# Patient Record
Sex: Male | Born: 1950 | Race: Black or African American | Hispanic: No | Smoking: Former smoker
Health system: Southern US, Community
[De-identification: ages and names within clinical notes are randomized; demographics above are authoritative.]

## PROBLEM LIST (undated history)

## (undated) DIAGNOSIS — K219 Gastro-esophageal reflux disease without esophagitis: Secondary | ICD-10-CM

## (undated) DIAGNOSIS — C61 Malignant neoplasm of prostate: Principal | ICD-10-CM

## (undated) DIAGNOSIS — C719 Malignant neoplasm of brain, unspecified: Secondary | ICD-10-CM

## (undated) DIAGNOSIS — Z809 Family history of malignant neoplasm, unspecified: Secondary | ICD-10-CM

## (undated) DIAGNOSIS — Z87891 Personal history of nicotine dependence: Secondary | ICD-10-CM

## (undated) DIAGNOSIS — C419 Malignant neoplasm of bone and articular cartilage, unspecified: Secondary | ICD-10-CM

## (undated) DIAGNOSIS — E669 Obesity, unspecified: Secondary | ICD-10-CM

## (undated) DIAGNOSIS — I10 Essential (primary) hypertension: Secondary | ICD-10-CM

## (undated) HISTORY — DX: Family history of malignant neoplasm, unspecified: Z80.9

## (undated) HISTORY — PX: COLONOSCOPY: SHX174

## (undated) HISTORY — DX: Obesity, unspecified: E66.9

## (undated) HISTORY — DX: Personal history of nicotine dependence: Z87.891

## (undated) HISTORY — DX: Malignant neoplasm of prostate: C61

---

## 2005-11-05 ENCOUNTER — Emergency Department (HOSPITAL_COMMUNITY): Admission: EM | Admit: 2005-11-05 | Discharge: 2005-11-05 | Payer: Self-pay | Admitting: Emergency Medicine

## 2006-05-15 ENCOUNTER — Emergency Department (HOSPITAL_COMMUNITY): Admission: EM | Admit: 2006-05-15 | Discharge: 2006-05-15 | Payer: Self-pay | Admitting: Family Medicine

## 2006-05-17 ENCOUNTER — Emergency Department (HOSPITAL_COMMUNITY): Admission: EM | Admit: 2006-05-17 | Discharge: 2006-05-17 | Payer: Self-pay | Admitting: Family Medicine

## 2009-02-08 ENCOUNTER — Emergency Department (HOSPITAL_BASED_OUTPATIENT_CLINIC_OR_DEPARTMENT_OTHER): Admission: EM | Admit: 2009-02-08 | Discharge: 2009-02-08 | Payer: Self-pay | Admitting: Emergency Medicine

## 2009-02-08 ENCOUNTER — Ambulatory Visit: Payer: Self-pay | Admitting: Diagnostic Radiology

## 2011-09-30 ENCOUNTER — Emergency Department (INDEPENDENT_AMBULATORY_CARE_PROVIDER_SITE_OTHER)
Admission: EM | Admit: 2011-09-30 | Discharge: 2011-09-30 | Disposition: A | Discharge status: Home / Self Care | Payer: BC Managed Care – PPO | Source: Home / Self Care | Attending: Family Medicine | Admitting: Family Medicine

## 2011-09-30 ENCOUNTER — Encounter: Payer: Self-pay | Admitting: *Deleted

## 2011-09-30 DIAGNOSIS — L0291 Cutaneous abscess, unspecified: Secondary | ICD-10-CM

## 2011-09-30 DIAGNOSIS — I10 Essential (primary) hypertension: Secondary | ICD-10-CM

## 2011-09-30 HISTORY — DX: Essential (primary) hypertension: I10

## 2011-09-30 LAB — POCT I-STAT, CHEM 8
Chloride: 108 mEq/L (ref 96–112)
HCT: 49 % (ref 39.0–52.0)
Potassium: 3.9 mEq/L (ref 3.5–5.1)

## 2011-09-30 MED ORDER — HYDROCHLOROTHIAZIDE 25 MG PO TABS
25.0000 mg | ORAL_TABLET | Freq: Every day | ORAL | Status: DC
Start: 2011-09-30 — End: 2014-05-29

## 2011-09-30 MED ORDER — MUPIROCIN CALCIUM 2 % EX CREA: TOPICAL_CREAM | Freq: Three times a day (TID) | CUTANEOUS | Status: AC

## 2011-09-30 MED ORDER — DOXYCYCLINE HYCLATE 100 MG PO CAPS: 100.0000 mg | ORAL_CAPSULE | Freq: Two times a day (BID) | ORAL | Status: AC

## 2011-09-30 NOTE — ED Provider Notes (Signed)
History     CSN: [id number] Arrival date & time: 09/30/2011  2:18 PM   First MD Initiated Contact with Patient 09/30/11 1408      Chief Complaint  Patient presents with  . Recurrent Skin Infections    (Consider location/radiation/quality/duration/timing/severity/associated sxs/prior treatment) HPI Comments: PATIENT REPORTS HAVING A RECURRENT SKIN INFECTION ON BACK OF HIS NECK. CURRENT EPISODE STARTED SEVERAL WKS AGO. HAS BEEN DRAINING. DENIES HX OF MRSA. ALSO ON ARRIVAL HIS BP NOTED TO BE ELEVATED. STATES HE HAS HX OF HPT BUT STOPPED TAKING MEDICATION WHEN HE RAN OUT AND HE HAS NO PCP. DENIES SYMPTOMS. NO HA, DIZZINESS OR WEAKNESS.   The history is provided by the patient.    Past Medical History  Diagnosis Date  . Hypertension     History reviewed. No pertinent past surgical history.  Family History  Problem Relation Age of Onset  . Hypertension Mother   . Diabetes Father     History  Substance Use Topics  . Smoking status: Current Everyday Smoker  . Smokeless tobacco: Not on file  . Alcohol Use: No      Review of Systems  Constitutional: Negative.   HENT: Negative.   Respiratory: Negative.   Cardiovascular: Negative.   Gastrointestinal: Negative.   Genitourinary: Negative.   Musculoskeletal: Negative.   Skin:       SKIN INFECTION POSTERIOR NECK  Neurological: Negative.     Allergies  Review of patient's allergies indicates not on file.  Home Medications   Current Outpatient Rx  Name Route Sig Dispense Refill  . DOXYCYCLINE HYCLATE 100 MG PO CAPS Oral Take 1 capsule (100 mg total) by mouth 2 (two) times daily. 20 capsule 0  . HYDROCHLOROTHIAZIDE 25 MG PO TABS Oral Take 1 tablet (25 mg total) by mouth daily. 30 tablet 0  . MUPIROCIN CALCIUM 2 % EX CREA Topical Apply topically 3 (three) times daily. 15 g 0    BP 189/92  Pulse 75  Temp(Src) 98.8 F (37.1 C) (Oral)  Resp 16  SpO2 96%  Physical Exam  Constitutional: He appears well-developed and  well-nourished. No distress.  HENT:  Head: Normocephalic and atraumatic.  Neck:       MULTIPLE PUSTULES RIGHT SIDE OF POSTERIOR NECK WITH A LARGER SCABBED AREA NOTED.   Cardiovascular: Normal rate and regular rhythm.   Pulmonary/Chest: Effort normal and breath sounds normal.  Musculoskeletal: Normal range of motion. He exhibits no edema.    ED Course  Procedures (including critical care time)   Labs Reviewed  POCT I-STAT, CHEM 8  I-STAT, CHEM 8   No results found.   1. Abscess   2. Hypertension       MDM          Randa Spike, MD 09/30/11 [phone]

## 2011-09-30 NOTE — ED Notes (Signed)
pT  HAS  HAD  RECURRENT  SKIN  INFECTIONS  ON  BACK OF  NECK     PT  HAS  TENDER       SWOLLEN  BOIL     X  SEV WEEKS        HE  ALSO  HAS  HISTORY OF  HTN       AND  NOT ON ANY MEDS

## 2013-06-11 ENCOUNTER — Emergency Department (HOSPITAL_COMMUNITY)
Admission: EM | Admit: 2013-06-11 | Discharge: 2013-06-11 | Disposition: A | Discharge status: Home / Self Care | Payer: 59 | Source: Home / Self Care

## 2013-06-11 ENCOUNTER — Encounter (HOSPITAL_COMMUNITY): Payer: Self-pay | Admitting: Emergency Medicine

## 2013-06-11 DIAGNOSIS — L400 Psoriasis vulgaris: Secondary | ICD-10-CM

## 2013-06-11 DIAGNOSIS — I10 Essential (primary) hypertension: Secondary | ICD-10-CM

## 2013-06-11 DIAGNOSIS — L408 Other psoriasis: Secondary | ICD-10-CM

## 2013-06-11 MED ORDER — TRIAMCINOLONE ACETONIDE 40 MG/ML IJ SUSP
40.0000 mg | Freq: Once | INTRAMUSCULAR | Status: AC
  Administered 2013-06-11: 40 mg via INTRAMUSCULAR

## 2013-06-11 MED ORDER — HYDROCHLOROTHIAZIDE 25 MG PO TABS: 25.0000 mg | ORAL_TABLET | Freq: Every day | ORAL | Status: DC

## 2013-06-11 MED ORDER — TRIAMCINOLONE ACETONIDE 0.1 % EX CREA: TOPICAL_CREAM | Freq: Two times a day (BID) | CUTANEOUS | Status: DC

## 2013-06-11 MED ORDER — TRIAMCINOLONE ACETONIDE 40 MG/ML IJ SUSP
INTRAMUSCULAR | Status: AC
  Filled 2013-06-11: qty 1

## 2013-06-11 NOTE — ED Provider Notes (Signed)
CSN: [id number]     Arrival date & time 06/11/13  1045 History   First MD Initiated Contact with Patient 06/11/13 1111     Chief Complaint  Patient presents with  . Hypertension    out of medications for years  . Rash    every summer on hands, chest, neck   (Consider location/radiation/quality/duration/timing/severity/associated sxs/prior Treatment) HPI Comments: 62 year old male had taken his brother to the emergency department and while he was waiting he decided to come to the urgent care to have some issues of his own taken care of. 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 He is complaining of a pruritic rash to the back of the neck the dorsum of the hands. These occur only for seasonal basis once a year during the summer typically when the son is the highest. There is a primarily papular rash with slight silver scales to the dorsum of the hands and atypical large plaques psoriasis to the back of the neck radiating to the anterior neck and down the chest and of the shaped pattern.  He also has a history of hypertension but does not take his medication in over a year and has no PCP. He denies chest pain, shortness of breath, edema or other known health problems or symptoms.   Past Medical History  Diagnosis Date  . Hypertension    History reviewed. No pertinent past surgical history. Family History  Problem Relation Age of Onset  . Hypertension Mother   . Diabetes Father    History  Substance Use Topics  . Smoking status: Current Every Day Smoker  . Smokeless tobacco: Not on file  . Alcohol Use: No    Review of Systems  Constitutional: Negative.   HENT: Negative.   Respiratory: Negative.   Cardiovascular: Negative.   Gastrointestinal: Negative.   Genitourinary: Negative.   Skin: Positive for rash.  Neurological: Negative.     Allergies  Review of patient's allergies indicates no known allergies.  Home Medications   Current Outpatient Rx  Name   Route  Sig  Dispense  Refill  . EXPIRED: hydrochlorothiazide (HYDRODIURIL) 25 MG tablet   Oral   Take 1 tablet (25 mg total) by mouth daily.   30 tablet   0   . hydrochlorothiazide (HYDRODIURIL) 25 MG tablet   Oral   Take 1 tablet (25 mg total) by mouth daily.   30 tablet   0   . triamcinolone cream (KENALOG) 0.1 %   Topical   Apply topically 2 (two) times daily. Apply for 2 weeks. May use on face   30 g   0    BP 188/115  Pulse 75  Temp(Src) 98.4 F (36.9 C) (Oral)  Resp 28  SpO2 92% Physical Exam  Nursing note and vitals reviewed. Constitutional: He is oriented to person, place, and time. He appears well-developed and well-nourished. No distress.  Neck: Normal range of motion. Neck supple.  Cardiovascular: Normal rate, regular rhythm and normal heart sounds.   Pulmonary/Chest: Effort normal and breath sounds normal. No respiratory distress. He has no wheezes. He has no rales.  Musculoskeletal: He exhibits no edema.  Neurological: He is alert and oriented to person, place, and time.  Skin: Skin is warm and dry. Rash noted.  Dry silvery scale raised plaque lesion covering the posterior neck  radiating anteriorly with the loss of the scalene and more of a papular rash in a V. shape over the chest. Similar type lesion to  the dorsum of each hand.   Psychiatric: He has a normal mood and affect.    ED Course  Procedures (including critical care time) Labs Review Labs Reviewed - No data to display Imaging Review No results found.  MDM   1. Plaque psoriasis   2. HTN (hypertension)    Triamcinolone cream BID Kenalog 40 mg IM HCTZ 25 mg q d Given info for locating a doctor.     Hayden Rasmussen, NP 06/11/13 [phone]

## 2013-06-11 NOTE — ED Notes (Signed)
Pt states rash appears on hands, back of neck, and chest during summertime. 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 Pt states rash goes away during the winter time. Pt using OTC Gold Bond ointment for rash with no relief. Pt also c/o hypertension and has been without medication for several years.

## 2013-06-13 NOTE — ED Provider Notes (Signed)
Medical screening examination/treatment/procedure(s) were performed by resident physician or non-physician practitioner and as supervising physician I was immediately available for consultation/collaboration.   Barkley Bruns MD.   Linna Hoff, MD 06/13/13 2049

## 2014-05-17 ENCOUNTER — Emergency Department (HOSPITAL_COMMUNITY)
Admission: EM | Admit: 2014-05-17 | Discharge: 2014-05-17 | Disposition: A | Discharge status: Home / Self Care | Payer: 59 | Source: Home / Self Care | Attending: Emergency Medicine | Admitting: Emergency Medicine

## 2014-05-17 ENCOUNTER — Encounter (HOSPITAL_COMMUNITY): Payer: Self-pay | Admitting: Emergency Medicine

## 2014-05-17 DIAGNOSIS — J301 Allergic rhinitis due to pollen: Secondary | ICD-10-CM

## 2014-05-17 DIAGNOSIS — I10 Essential (primary) hypertension: Secondary | ICD-10-CM

## 2014-05-17 MED ORDER — HYDROCHLOROTHIAZIDE 25 MG PO TABS: 25.0000 mg | ORAL_TABLET | Freq: Every day | ORAL | Status: DC

## 2014-05-17 MED ORDER — PREDNISONE 20 MG PO TABS: ORAL_TABLET | ORAL | Status: DC

## 2014-05-17 MED ORDER — FEXOFENADINE HCL 180 MG PO TABS: 180.0000 mg | ORAL_TABLET | Freq: Every day | ORAL | Status: DC

## 2014-05-17 MED ORDER — METHYLPREDNISOLONE ACETATE 80 MG/ML IJ SUSP
INTRAMUSCULAR | Status: AC
  Filled 2014-05-17: qty 1

## 2014-05-17 MED ORDER — AZELASTINE HCL 0.15 % NA SOLN: NASAL | Status: DC

## 2014-05-17 MED ORDER — FLUTICASONE PROPIONATE 50 MCG/ACT NA SUSP: 2.0000 | Freq: Every day | NASAL | Status: DC

## 2014-05-17 MED ORDER — METHYLPREDNISOLONE ACETATE 80 MG/ML IJ SUSP
80.0000 mg | Freq: Once | INTRAMUSCULAR | Status: AC
  Administered 2014-05-17: 80 mg via INTRAMUSCULAR

## 2014-05-17 NOTE — Discharge Instructions (Signed)
People who suffer from allergies frequently have symptoms of nasal congestion, runny nose, sneezing, itching of the nose, eyes, ears or throat, mucous in the throat, watering of the eyes and cough.  These symptoms are caused by the body's immune response to environmental allergens.  For seasonal allergies this is pollen (tree pollen in the spring, grass pollen in the summer, and weed pollen in the fall).  Year round allergy symptoms are usually caused by dust or mould.  Many people have year round symptoms which are worse seasonally. ° °For people who have seasonal allergies, pollen avoidance may help to decease symptoms.  This means keeping windows in the house down and windows in the car up.  Run your air conditioning, since this filters out many of the pollen particles.  If you have to spend a prolonged time outdoors during heavy pollen season, it might be prudent to wear a mask.  These can be purchased at any drug store.  When you come in after heavy pollen exposure, your skin, clothing and hair are covered with pollen.  Changing your clothing, taking a shower, and washing your hair may help with your pollen exposure.  Also, your bedding, pillow, and pillowcase may become contaminated with pollen, so frequent washing of your bedding and pillowcase and changing out your pillow may help as well.  (Your pillow can also be a source of dust and mould exposure as well.)  Showering at bedtime may also help. ° °During heavy pollen season (April and September), a large amount of pollen gets trapped in your nasal cavity.  This can contribute to ongoing allergy symptoms.  Saline irrigation of the nasal cavity can help to remove this and relieve allergy symptoms.  This can be accomplished in several ways.  You can mix up your own saline solution using the following recipe:  8 oz of distilled or boiled water, 1/2 tsp of table salt (sodium choride), and a pinch of baking soda (sodium bicarbonate).  If nasal congestion is a  problem.  1 to 2 drops of Afrin solution can be added to this as well.  To do the irrigation, purchase a nasal bulb syringe (the kind you would use to clean out an infant's nose).  Fill this up with the solution, lean you head over a sink with the nostril to be irrigated turned upward, insert the syringe into your nostril, making a tight seal, and gently irrigate, compressing the bulb.  The solution will flow into your nostril and out the other, some may also come out of your mouth.  Repeat this on both sides.  You can do this once daily.  Do not store the solution, mix it up fresh each day.  A commercial solution, called Neomed Solution, can be purchased over the counter without prescription.  You can also use a Netti Pot for irrigation.  These can be purchased at your drug store as well.  Be sure to use distilled or boiled water in these as well and make sure the Netti pot is completely dry between uses. ° °Over the counter medications can be helpful, and in many cases can completely control allergy symptoms without resorting to more expensive prescription meds.   °Antihistamines are the mainstay of allergy treatment.  The newer non-sedating antihistamines are all available over the counter.  These include Allegra, Zyrtec, and Claritin which also can be purchased in their generic forms: fexofenadine, cetirizine, and  Cetirizine.  Combining these meds with a decongestant such as pseudoephedrine or   phenylephrine helps with nasal congestion, but decongestants can also cause elevations in blood pressure.  Pseudoephedrine tends to be more effective than phenylephrine.  The older, more sedating antihistamines such as chlorpheniramine, brompheniramine, and diphenhydramine are also very effective, sometimes more so than the newer antihistamines, but with the price of more sedation.  You should be careful about driving or operating heavy machinery when taking sedating antihistamines, and men with enlarged prostates may  experience urinary retention with diphenhydramine.  Naslacrom nasal spray can be very effective for allergy symptoms.  It is available over the counter and has very few side effects.  The dosage is 2 sprays in each nostril twice daily.  It is recommended that you pinch your nose shut for 30 seconds after using it since it is a watery spray and can run out.  It can be used as long as needed.  There is no risk of dependency.  For people with year round allergies, dust, mould, insect emanations, and pet dander are usually the culprits.  To avoid dust, you need to avoid dust mites which are the main source of allergens in house dust.  Cover your bedding with moisture and mite impervious covers.  These can be purchased at any mattress store.  The modern covers are a little expensive, but not at all uncomfortable. Keeping your house as dry as possible will also help to control dust mites.  Do not use a humidifier and it may help to use a dehumidifier.  Use of a HEPA filter air filter is also a great way to reduce dust and mold exposure.  These units can be purchased commercially.  Make sure to buy one large enough for the room you intend to use it.  Change the filter as per the manufacturer's instructions.  Also, using a HEPA filter vacuum for your carpets is helpful.  There are chemicals that you can sprinkle on your carpet called acaricides that will kill dist mites.  The most commonly used brand is Acarosan.  This can be purchased on line.  It does have to be periodically reapplied.  Wash you pillows and bedsheets regularly in hot water.          Allergic Rhinitis Allergic rhinitis is when the mucous membranes in the nose respond to allergens. Allergens are particles in the air that cause your body to have an allergic reaction. This causes you to release allergic antibodies. Through a chain of events, these eventually cause you to release histamine into the blood stream. Although meant to protect the  body, it is this release of histamine that causes your discomfort, such as frequent sneezing, congestion, and an itchy, runny nose.  CAUSES  Seasonal allergic rhinitis (hay fever) is caused by pollen allergens that may come from grasses, trees, and weeds. Year-round allergic rhinitis (perennial allergic rhinitis) is caused by allergens such as house dust mites, pet dander, and mold spores.  SYMPTOMS   Nasal stuffiness (congestion).  Itchy, runny nose with sneezing and tearing of the eyes. DIAGNOSIS  Your health care provider can help you determine the allergen or allergens that trigger your symptoms. If you and your health care provider are unable to determine the allergen, skin or blood testing may be used. TREATMENT  Allergic rhinitis does not have a cure, but it can be controlled by:  Medicines and allergy shots (immunotherapy).  Avoiding the allergen. Hay fever may often be treated with antihistamines in pill or nasal spray forms. Antihistamines block the effects of histamine.  There are over-the-counter medicines that may help with nasal congestion and swelling around the eyes. Check with your health care provider before taking or giving this medicine.  If avoiding the allergen or the medicine prescribed do not work, there are many new medicines your health care provider can prescribe. Stronger medicine may be used if initial measures are ineffective. Desensitizing injections can be used if medicine and avoidance does not work. Desensitization is when a patient is given ongoing shots until the body becomes less sensitive to the allergen. Make sure you follow up with your health care provider if problems continue. HOME CARE INSTRUCTIONS It is not possible to completely avoid allergens, but you can reduce your symptoms by taking steps to limit your exposure to them. It helps to know exactly what you are allergic to so that you can avoid your specific triggers. SEEK MEDICAL CARE IF:   You have  a fever.  You develop a cough that does not stop easily (persistent).  You have shortness of breath.  You start wheezing.  Symptoms interfere with normal daily activities. Document Released: 06/28/2001 Document Revised: 10/08/2013 Document Reviewed: 06/10/2013 [facility] Patient Information 2015 [place], [place]. This information is not intended to replace advice given to you by your health care provider. Make sure you discuss any questions you have with your health care provider.

## 2014-05-17 NOTE — ED Notes (Signed)
Pt  Has  Symptoms  Of  Nasal  Congestion  Stuffy nose  And  Sinus  Pressure   For  4-5  Days  Pt   Has  Been  Using OTC  Sinus  Sprays            \  Bp   Is  High  Out of  meds

## 2014-05-17 NOTE — ED Provider Notes (Signed)
Chief Complaint   Chief Complaint  Patient presents with  . URI    History of Present Illness   Steven Church is a 63 year old male who is in today for nasal congestion and a refill on his blood pressure meds.  1. Nasal congestion: The patient has had a one-week history of nasal congestion mostly at nighttime without any drainage. The patient states he has to sleep sitting up, and sometimes he gets panicked because he has difficulty breathing and postnasal drip. He does have some postnasal drainage. He denies any headache, fever, chills, purulent drainage, sore throat, stiff neck, cough, or wheezing. He has no prior history of allergies or asthma. He's using an over-the-counter nasal spray and a decongestant.  2. High blood pressure: He is on HCTZ for his blood pressure. He ran out months ago. He denies headaches, dizziness, blurry vision, shortness of breath, chest pain, ankle edema, or strokelike symptoms.  Review of Systems   Other than as noted above, the patient denies any of the following symptoms. Systemic:  No fever, chills, or headache. Eye:  No redness, itching, watering, pain or drainage. ENT:  No earache, ear congestion, sinus pressure or pain, post nasal drip, or sore throat. Lungs:  No cough, sputum production, wheezing, or shortness of breath. Skin:  No rash or itching.  [place]   Past medical history, family history, social history, meds, and allergies were reviewed.    Physical Exam     Vital signs:  BP 192/129  Pulse 95  Temp(Src) 97.5 F (36.4 C) (Oral)  SpO2 96% General:  Alert, in no distress. Eye:  No conjunctival injection or drainage. Lids were normal. ENT:  TMs and canals were normal, without erythema or inflammation.  Nasal mucosa was congested, pale and boggy with no drainage.  Mucous membranes were moist.  Pharynx was clear, without exudate or drainage.  There were no oral ulcerations or lesions. Neck:  Supple, no adenopathy, tenderness or  mass. Lungs:  No respiratory distress.  Lungs were clear to auscultation, without wheezes, rales or rhonchi.  Breath sounds were clear and equal bilaterally. Heart:  Regular rhythm, without gallops, murmers or rubs. Skin:  Clear, warm, and dry, without rash or lesions.  Course in Urgent [place]   The following medications were given:  Medications  methylPREDNISolone acetate (DEPO-MEDROL) injection 80 mg (80 mg Intramuscular Given 05/17/14 1206)   Assessment   The primary encounter diagnosis was Allergic rhinitis due to pollen. A diagnosis of Essential hypertension was also pertinent to this visit.  Suggested followup with her primary care physician within the next 2 weeks.  Plan     1.  Meds:  The following meds were prescribed:   Discharge Medication List as of 05/17/2014 12:01 PM    START taking these medications   Details  Azelastine HCl (ASTEPRO) 0.15 % SOLN 2 sprays in each nostril BID, Normal    fexofenadine (ALLEGRA) 180 MG tablet Take 1 tablet (180 mg total) by mouth daily., Starting 05/17/2014, Until Discontinued, Normal    fluticasone (FLONASE) 50 MCG/ACT nasal spray Place 2 sprays into both nostrils daily., Starting 05/17/2014, Until Discontinued, Normal    predniSONE (DELTASONE) 20 MG tablet Take 3 daily for 5 days, 2 daily for 5 days, 1 daily for 5 days., Normal        2.  Patient Education/Counseling:  The patient was given appropriate handouts, self care instructions, and instructed in symptomatic relief. The patient was instructed in allergen avoidance.  3.  Follow up:  The patient was told to follow up here if no better in 3 to 4 days, or sooner if becoming worse in any way, and given some red flag symptoms such as fever or difficulty breathing which would prompt immediate return.  Follow up with Dr. Samara Snide in 2 weeks.        Harden Mo, MD 05/17/14 [phone]

## 2014-05-29 ENCOUNTER — Encounter (HOSPITAL_COMMUNITY): Payer: Self-pay | Admitting: Emergency Medicine

## 2014-05-29 ENCOUNTER — Emergency Department (INDEPENDENT_AMBULATORY_CARE_PROVIDER_SITE_OTHER)
Admission: EM | Admit: 2014-05-29 | Discharge: 2014-05-29 | Disposition: A | Discharge status: Home / Self Care | Payer: 59 | Source: Home / Self Care | Attending: Emergency Medicine | Admitting: Emergency Medicine

## 2014-05-29 DIAGNOSIS — I10 Essential (primary) hypertension: Secondary | ICD-10-CM

## 2014-05-29 DIAGNOSIS — J309 Allergic rhinitis, unspecified: Secondary | ICD-10-CM

## 2014-05-29 MED ORDER — IPRATROPIUM BROMIDE 0.06 % NA SOLN: 2.0000 | Freq: Four times a day (QID) | NASAL | Status: DC

## 2014-05-29 MED ORDER — CHLORTHALIDONE 25 MG PO TABS: 25.0000 mg | ORAL_TABLET | Freq: Every day | ORAL | Status: DC

## 2014-05-29 NOTE — ED Notes (Addendum)
Here to follow up on sinus issues States he is not feeling any better   States he received a letter stating HCTZ is being recalled

## 2014-05-29 NOTE — ED Provider Notes (Signed)
  Chief Complaint   Chief Complaint  Patient presents with  . Follow-up  . Medication Management    History of Present Illness   Steven Church is a 63 year old male who was just here several weeks ago. He returns for followup on the same issues, namely blood pressure and nasal congestion. He got his prescription for hydrochlorothiazide filled, then got a letter from the pharmacy stating that the medicine had been recalled due to finding a Plavix tablet in a batch of hydrochlorothiazide. The entire batch therefore had to be recalled. He has not noticed any unusual or strange tablets in his batch of medication. He's had no bleeding problems. He states he has felt dizzy and lightheaded. He would like a replacement for the hydrochlorothiazide. He also states he continues to have nasal congestion particularly at nighttime when he lies down. He feels like he has to sleep sitting up. He denies any rhinorrhea. He is able to breathe through his nose fine during the daytime. He tried the Astelin spray but didn't do any good, in fact it made it feel worse. He is using Allegra and Flonase.  Review of Systems   Other than as noted above, the patient denies any of the following symptoms: Respiratory:  No coughing, wheezing, or shortness of breath. Cardiac:  No chest pain, tightness, pressure, palpitations, syncope, or edema. Neuro:  No headache, dizziness, blurred vision, weakness, paresthesias, or strokelike symptoms.   [place]   Past medical history, family history, social history, meds, and allergies were reviewed.    Physical Examination   Vital signs:  BP 142/98  Pulse 90  Temp(Src) 99.4 F (37.4 C) (Oral)  Resp 16  SpO2 96% General:  Alert, oriented, in no distress. Lungs:  Breath sounds clear and equal bilaterally.  No wheezes, rales, or rhonchi. Heart:  Regular rhythm, no gallops, murmers, clicks or rubs.  Abdomen:  Soft and flat.  Nontender, no organomegaly or mass.  No pulsatile  midline abdominal mass or bruit. Ext:  No edema, pulses full. Neurological exam:  Alert and oriented.  Speech is clear.  No pronator drift.  CNs intact.  Assessment   The primary encounter diagnosis was Essential hypertension. A diagnosis of Allergic rhinitis, unspecified allergic rhinitis type was also pertinent to this visit.  Plan   1.  Meds:  The following meds were prescribed:   Discharge Medication List as of 05/29/2014  2:07 PM    START taking these medications   Details  chlorthalidone (HYGROTON) 25 MG tablet Take 1 tablet (25 mg total) by mouth daily., Starting 05/29/2014, Until Discontinued, Normal    ipratropium (ATROVENT) 0.06 % nasal spray Place 2 sprays into both nostrils 4 (four) times daily., Starting 05/29/2014, Until Discontinued, Normal        2.  Patient Education/Counseling:  The patient was given appropriate handouts, self care instructions, and instructed in symptomatic relief. Specifically discussed salt and sodium restriction, weight control, and exercise.   3.  Follow up:  The patient was told to follow up here if no better in 3 to 4 days, or sooner if becoming worse in any way, and given some red flag symptoms such as severe headache, vision changes, shortness of breath, chest pain or stroke like symptoms which would prompt immediate return.  I have recommended that he see an allergist about his ongoing nasal congestion.    Harden Mo, MD 05/29/14 [phone]

## 2014-05-29 NOTE — Discharge Instructions (Signed)
Blood pressure over the ideal can put you at higher risk for stroke, heart disease, and kidney failure.  For this reason, it's important to try to get your blood pressure as close as possible to the ideal.  The ideal blood pressure is 120/80.  Blood pressures from 469-629 systolic over 52-84 diastolic are labeled as "prehypertension."  This means you are at higher risk of developing hypertension in the future.  Blood pressures in this range are not treated with medication, but lifestyle changes are recommended to prevent progression to hypertension.  Blood pressures of 132 and above systolic over 90 and above diastolic are classified as hypertension and are treated with medications.  Lifestyle changes which can benefit both prehypertension and hypertension include the following:   Salt and sodium restriction.  Weight loss.  Regular exercise.  Avoidance of tobacco.  Avoidance of excess alcohol.  The "D.A.S.H" diet.   People with hypertension and prehypertension should limit their salt intake to less than 1500 mg daily.  Reading the nutrition information on the label of many prepared foods can give you an idea of how much sodium you're consuming at each meal.  Remember that the most important number on the nutrition information is the serving size.  It may be smaller than you think.  Try to avoid adding extra salt at the table.  You may add small amounts of salt while cooking.  Remember that salt is an acquired taste and you may get used to a using a whole lot less salt than you are using now.  Using less salt lets the food's natural flavors come through.  You might want to consider using salt substitutes, potassium chloride, pepper, or blends of herbs and spices to enhance the flavor of your food.  Foods that contain the most salt include: processed meats (like ham, bacon, lunch meat, sausage, hot dogs, and breakfast meat), chips, pretzels, salted nuts, soups, salty snacks, canned foods, junk  food, fast food, restaurant food, mustard, pickles, pizza, popcorn, soy sauce, and worcestershire sauce--quite a list!  You might ask, "Is there anything I can eat?"  The answer is, "yes."  Fruits and vegetables are usually low in salt.  Fresh is better than frozen which is better than canned.  If you have canned vegetables, you can cut down on the salt content by rinsing them in tap water 3 times before cooking.     Weight loss is the second thing you can do to lower your blood pressure.  Getting to and maintaining ideal weight will often normalize your blood pressure and allow you to avoid medications, entirely, cut way down on your dosage of medications, or allow to wean off your meds.  (Note, this should only be done under the supervision of your primary care doctor.)  Of course, weight loss takes time and you may need to be on medication in the meantime.  You shoot for a body mass index of 20-25.  When you go to the urgent care or to your primary care doctor, they should calculate your BMI.  If you don't know what it is, ask.  You can calculate your BMI with the following formula:  Weight in pounds x 703/ (height in inches) x (height in inches).  There are many good diets out there: Weight Watchers and the D.A.S.H. Diet are the best, but often, just modifying a few factors can be helpful:  Don't skip meals, don't eat out, and keeping a food diary.  I do not recommend  fad diets or diet pills which often raise blood pressure.    Everyone should get regular exercise, but this is particularly important for people with high blood pressure.  Just about any exercise is good.  The only exercise which may be harmful is lifting extreme heavy weights.  I recommend moderate exercise such as walking for 30 minutes 5 days a week.  Going to the gym for a 50 minute workout 3 times a week is also good.  This amounts to 150 minutes of exercise weekly.   Anyone with high blood pressure should avoid any use of tobacco.   Tobacco use does not elevate blood pressure, but it increases the risk of heart disease and stroke.  If you are interested in quitting, discuss with your doctor how to quit.  If you are not interested in quitting, ask yourself, "What would my life be like in 10 years if I continue to smoke?"  "How will I know when it is time to quit?"  "How would my life be better if I were to quit."   Excess alcohol intake can raise the blood pressure.  The safe alcohol intake is 2 drinks or less per day for men and 1 drink per day or less for women.   There is a very good diet which I recommend that has been designed for people with blood pressure called the D.A.S.H. Diet (dietary approaches to stop hypertension).  It consists of fruits, vegetables, lean meats, low fat dairy, whole grains, nuts and seeds.  It is very low in salt and sodium.  It has also been found to have other beneficial health effects such as lowering cholesterol and helping lose weight.  It has been developed by the [employer] and can be downloaded from the internet without any cost. Just do a Development worker, community on "D.A.S.H. Diet." or go the NIH website (MasterBoxes.it).  There are also cookbooks and diet plans that can be gotten from [place] to help you with this diet.  People who suffer from allergies frequently have symptoms of nasal congestion, runny nose, sneezing, itching of the nose, eyes, ears or throat, mucous in the throat, watering of the eyes and cough.  These symptoms are caused by the body's immune response to environmental allergens.  For seasonal allergies this is pollen (tree pollen in the spring, grass pollen in the summer, and weed pollen in the fall).  Year round allergy symptoms are usually caused by dust or mould.  Many people have year round symptoms which are worse seasonally.  For people who have seasonal allergies, pollen avoidance may help to decease symptoms.  This means keeping windows in the house down and windows  in the car up.  Run your air conditioning, since this filters out many of the pollen particles.  If you have to spend a prolonged time outdoors during heavy pollen season, it might be prudent to wear a mask.  These can be purchased at any drug store.  When you come in after heavy pollen exposure, your skin, clothing and hair are covered with pollen.  Changing your clothing, taking a shower, and washing your hair may help with your pollen exposure.  Also, your bedding, pillow, and pillowcase may become contaminated with pollen, so frequent washing of your bedding and pillowcase and changing out your pillow may help as well.  (Your pillow can also be a source of dust and mould exposure as well.)  Showering at bedtime may also help.  During  heavy pollen season (April and September), a large amount of pollen gets trapped in your nasal cavity.  This can contribute to ongoing allergy symptoms.  Saline irrigation of the nasal cavity can help to remove this and relieve allergy symptoms.  This can be accomplished in several ways.  You can mix up your own saline solution using the following recipe:  8 oz of distilled or boiled water, 1/2 tsp of table salt (sodium choride), and a pinch of baking soda (sodium bicarbonate).  If nasal congestion is a problem.  1 to 2 drops of Afrin solution can be added to this as well.  To do the irrigation, purchase a nasal bulb syringe (the kind you would use to clean out an infant's nose).  Fill this up with the solution, lean you head over a sink with the nostril to be irrigated turned upward, insert the syringe into your nostril, making a tight seal, and gently irrigate, compressing the bulb.  The solution will flow into your nostril and out the other, some may also come out of your mouth.  Repeat this on both sides.  You can do this once daily.  Do not store the solution, mix it up fresh each day.  A commercial solution, called Neomed Solution, can be purchased over the counter without  prescription.  You can also use a Netti Pot for irrigation.  These can be purchased at your drug store as well.  Be sure to use distilled or boiled water in these as well and make sure the Netti pot is completely dry between uses.  Over the counter medications can be helpful, and in many cases can completely control allergy symptoms without resorting to more expensive prescription meds.   Antihistamines are the mainstay of allergy treatment.  The newer non-sedating antihistamines are all available over the counter.  These include Allegra, Zyrtec, and Claritin which also can be purchased in their generic forms: fexofenadine, cetirizine, and  Cetirizine.  Combining these meds with a decongestant such as pseudoephedrine or phenylephrine helps with nasal congestion, but decongestants can also cause elevations in blood pressure.  Pseudoephedrine tends to be more effective than phenylephrine.  The older, more sedating antihistamines such as chlorpheniramine, brompheniramine, and diphenhydramine are also very effective, sometimes more so than the newer antihistamines, but with the price of more sedation.  You should be careful about driving or operating heavy machinery when taking sedating antihistamines, and men with enlarged prostates may experience urinary retention with diphenhydramine.  Naslacrom nasal spray can be very effective for allergy symptoms.  It is available over the counter and has very few side effects.  The dosage is 2 sprays in each nostril twice daily.  It is recommended that you pinch your nose shut for 30 seconds after using it since it is a watery spray and can run out.  It can be used as long as needed.  There is no risk of dependency.  For people with year round allergies, dust, mould, insect emanations, and pet dander are usually the culprits.  To avoid dust, you need to avoid dust mites which are the main source of allergens in house dust.  Cover your bedding with moisture and mite  impervious covers.  These can be purchased at any mattress store.  The modern covers are a little expensive, but not at all uncomfortable. Keeping your house as dry as possible will also help to control dust mites.  Do not use a humidifier and it may help to use  a dehumidifier.  Use of a HEPA filter air filter is also a great way to reduce dust and mold exposure.  These units can be purchased commercially.  Make sure to buy one large enough for the room you intend to use it.  Change the filter as per the manufacturer's instructions.  Also, using a HEPA filter vacuum for your carpets is helpful.  There are chemicals that you can sprinkle on your carpet called acaricides that will kill dist mites.  The most commonly used brand is Acarosan.  This can be purchased on line.  It does have to be periodically reapplied.  Wash you pillows and bedsheets regularly in hot water.

## 2014-06-13 ENCOUNTER — Ambulatory Visit (INDEPENDENT_AMBULATORY_CARE_PROVIDER_SITE_OTHER): Payer: 59 | Admitting: Medical

## 2014-06-13 ENCOUNTER — Encounter: Payer: Self-pay | Admitting: Medical

## 2014-06-13 VITALS — BP 160/88 | HR 78 | Temp 98.2°F | Resp 16 | Wt 238.0 lb

## 2014-06-13 DIAGNOSIS — R0981 Nasal congestion: Secondary | ICD-10-CM

## 2014-06-13 DIAGNOSIS — Z23 Encounter for immunization: Secondary | ICD-10-CM

## 2014-06-13 DIAGNOSIS — J3489 Other specified disorders of nose and nasal sinuses: Secondary | ICD-10-CM

## 2014-06-13 DIAGNOSIS — I10 Essential (primary) hypertension: Secondary | ICD-10-CM

## 2014-06-13 DIAGNOSIS — J309 Allergic rhinitis, unspecified: Secondary | ICD-10-CM

## 2014-06-13 MED ORDER — OMEPRAZOLE 40 MG PO CPDR: 40.0000 mg | DELAYED_RELEASE_CAPSULE | Freq: Every day | ORAL | Status: DC

## 2014-06-13 MED ORDER — AZILSARTAN-CHLORTHALIDONE 40-25 MG PO TABS: 1.0000 | ORAL_TABLET | ORAL | Status: DC

## 2014-06-13 NOTE — Patient Instructions (Signed)
Recommendations  High blood pressure  Begin new blood pressure medication Edarbychlor 40/25 mg, 1 tablet daily in the morning. I gave you 3 weeks worth of samples today and sent prescription to the pharmacy. Make sure you take the coupon card with the to the pharmacy  Stop the chlorthalidone blood pressure medication  Nasal congestion, rhinitis  Use the Flonase, 2 separate sprays each nostril each morning, with a "sniff, sniff"  Use the ipratropium nasal spray,2 separate sprays each nostril each evening/bedtime, with a "sniff, sniff"  Continue the fexofenadine allergy pill at bedtime  For acid reflux  begin omeprazole 1 tablet 45 minutes prior to breakfast each day  Work on eating a healthier diet to lose weight, drink plenty of water daily, cut back on fast food, fried foods, drink more water  Let's followup with a physical visit in 2-4 weeks

## 2014-06-13 NOTE — Progress Notes (Signed)
   Subjective:   Steven Church is a 63 y.o. male presenting on 06/13/2014 with Allergic Rhinitis  and Sinusitis  New patient today.  Was seeing urgent care prior.    Hx/o HTN, recently was changed to chlorthalidone after there was a recall on generic HCTZ per pharmacy.  Compliant.  Tries to be careful with salt but does admit to eating fast food somewhat regularly.walks often at work.  Works at [employer], on the go always.  Main concern today is nasal congestion ongoing for over a month.   Using 3 medications, but neither seem to be helping.  He does feel the nasal spray run down his throat after inhalation.   Taking the fexofenadine QHS.   Denies prior physical or prostate exam.  Lives alone.  No prior colonoscopy.  No other aggravating or relieving factors.  No other complaint.  Review of Systems ROS as in subjective      Objective:     Filed Vitals:   06/13/14 1022  BP: 160/88  Pulse: 78  Temp: 98.2 F (36.8 C)  Resp: 16    General appearance: alert, no distress, WD/WN, obese AA male HEENT: normocephalic, sclerae anicteric, TMs pearly, nares with mild turbinated edema, mild clear discharge or erythema, pharynx normal Oral cavity: MMM, no lesions Neck: supple, no lymphadenopathy, no thyromegaly, no masses Heart: RRR, normal S1, S2, no murmurs Lungs: CTA bilaterally, no wheezes, rhonchi, or rales Abdomen: +bs, soft, non tender, non distended, no masses, no hepatomegaly, no splenomegaly Pulses: 2+ symmetric, upper and lower extremities, normal cap refill Ext: no edema     Assessment: Encounter Diagnoses  Name Primary?  . Essential hypertension, benign Yes  . Nasal congestion   . Allergic rhinitis, unspecified allergic rhinitis type   . Need for prophylactic vaccination and inoculation against influenza      Plan: HTN - prior failed HCTZ and chlorthalidone.  Begin trial of Edarbyclor. 40/25mg  daily.  Samples and script given.  F/u 14mo.  Nasal  congestion, allergic rhinitis - discussed proper use of medications.   C/t Fexofenadine QHS daily, c/t Flonase 1-2 sprays mornings, Ipratropium 1-2 sprays QHS.  F/u 23mo  Counseled on the influenza virus vaccine.  Vaccine information sheet given.  Influenza vaccine given after consent obtained.   Steven Church was seen today for allergic rhinitis  and sinusitis.  Diagnoses and associated orders for this visit:  Essential hypertension, benign  Nasal congestion  Allergic rhinitis, unspecified allergic rhinitis type  Need for prophylactic vaccination and inoculation against influenza - Flu Vaccine QUAD 36+ mos PF IM (Fluarix Quad PF)  Other Orders - Azilsartan-Chlorthalidone (EDARBYCLOR) 40-25 MG TABS; Take 1 tablet by mouth every morning. - omeprazole (PRILOSEC) 40 MG capsule; Take 1 capsule (40 mg total) by mouth daily.     Return in about 1 month (around 07/14/2014).

## 2014-07-11 ENCOUNTER — Telehealth: Payer: Self-pay | Admitting: Internal Medicine

## 2014-07-11 ENCOUNTER — Encounter: Payer: Self-pay | Admitting: Family Medicine

## 2014-07-11 ENCOUNTER — Ambulatory Visit (INDEPENDENT_AMBULATORY_CARE_PROVIDER_SITE_OTHER): Payer: 59 | Admitting: Family Medicine

## 2014-07-11 VITALS — BP 130/86 | HR 60 | Wt 244.0 lb

## 2014-07-11 DIAGNOSIS — J309 Allergic rhinitis, unspecified: Secondary | ICD-10-CM

## 2014-07-11 DIAGNOSIS — I10 Essential (primary) hypertension: Secondary | ICD-10-CM

## 2014-07-11 MED ORDER — MOMETASONE FUROATE 50 MCG/ACT NA SUSP: 2.0000 | Freq: Every day | NASAL | Status: DC

## 2014-07-11 NOTE — Telephone Encounter (Signed)
Pharmacy sent a fax stating that nasonex is on back order and to send in an alternative to [facility]

## 2014-07-11 NOTE — Telephone Encounter (Signed)
Forgot to send this message to you

## 2014-07-11 NOTE — Patient Instructions (Addendum)
Switch to loratadine. Try the Nasonex. Stay on your new blood pressure medicine. Only use the oxymetazoline at night for the next several nights and then stop. If you still have trouble in 2 weeks call back Petra Kuba you cut back on fluids at night. Try some saw palmetto. If that doesn't work then come back for reevaluation

## 2014-07-11 NOTE — Progress Notes (Signed)
   Subjective:    Patient ID: Steven Church, male    DOB: 12-19-1950, 63 y.o.   MRN: [id number]  HPI He is here for recheck. He is on a new blood pressure medication. He also continues on his allergy medications and states they Flonase, fexofenadine and ipratropium nasal spray are not working. He has been using oxymetazoline. He is mainly having difficulty with sleep at night.   Review of Systems     Objective:   Physical Exam Alert and in no distress. Blood pressure is recorded.       Assessment & Plan:  Essential hypertension  Allergic rhinitis, unspecified allergic rhinitis type - Plan: mometasone (NASONEX) 50 MCG/ACT nasal spray  Recommend he cut back and only use oxymetazoline at night for the next several nights. I will switch him to Nasonex and have them also switch to loratadine. He is to continue the ipratropium nasal spray. If continued difficulty vascular call us.

## 2014-07-25 ENCOUNTER — Encounter (HOSPITAL_COMMUNITY): Payer: Self-pay | Admitting: Emergency Medicine

## 2014-07-25 ENCOUNTER — Emergency Department (HOSPITAL_COMMUNITY)
Admission: EM | Admit: 2014-07-25 | Discharge: 2014-07-25 | Disposition: A | Discharge status: Home / Self Care | Payer: 59 | Attending: Emergency Medicine | Admitting: Emergency Medicine

## 2014-07-25 DIAGNOSIS — Z7952 Long term (current) use of systemic steroids: Secondary | ICD-10-CM | POA: Diagnosis not present

## 2014-07-25 DIAGNOSIS — Z87891 Personal history of nicotine dependence: Secondary | ICD-10-CM | POA: Diagnosis not present

## 2014-07-25 DIAGNOSIS — Z79899 Other long term (current) drug therapy: Secondary | ICD-10-CM | POA: Diagnosis not present

## 2014-07-25 DIAGNOSIS — J310 Chronic rhinitis: Secondary | ICD-10-CM | POA: Diagnosis not present

## 2014-07-25 DIAGNOSIS — T485X5A Adverse effect of other anti-common-cold drugs, initial encounter: Secondary | ICD-10-CM

## 2014-07-25 DIAGNOSIS — I159 Secondary hypertension, unspecified: Secondary | ICD-10-CM | POA: Diagnosis not present

## 2014-07-25 DIAGNOSIS — R0981 Nasal congestion: Secondary | ICD-10-CM | POA: Diagnosis not present

## 2014-07-25 MED ORDER — BETAMETHASONE SOD PHOS & ACET 6 (3-3) MG/ML IJ SUSP
12.0000 mg | Freq: Once | INTRAMUSCULAR | Status: AC
  Administered 2014-07-25: 12 mg via INTRAMUSCULAR
  Filled 2014-07-25: qty 2

## 2014-07-25 NOTE — ED Provider Notes (Signed)
CSN: [id number]     Arrival date & time 07/25/14  1761 History   First MD Initiated Contact with Patient 07/25/14 [phone]     Chief Complaint  Patient presents with  . Nasal Congestion     (Consider location/radiation/quality/duration/timing/severity/associated sxs/prior Treatment) The history is provided by the patient.   Steven Church is a 63 y.o. male presents for evaluation of chronic nasal congestion, previously evaluated and treated. Currently, he is using an over-the-counter nasal decongestant, for the last 3 weeks, with out resolution. He has previously been on nasal steroids, but stopped taking them because they did not work. He saw his PCP, 07/11/2014, and was advised to stop using decongestant. He was previously treated with an injection of some type, from the emergency department, with resolution of symptoms. He denies cough, fever, chills, nausea, vomiting, weakness, or dizziness. He's not taking antihypertensive medication. He denies headache, weakness, blurred vision, or paresthesia. There are no other known modifying factors.    Past Medical History  Diagnosis Date  . Hypertension    History reviewed. No pertinent past surgical history. Family History  Problem Relation Age of Onset  . Hypertension Mother   . Diabetes Father    History  Substance Use Topics  . Smoking status: Former Research scientist (life sciences)  . Smokeless tobacco: Not on file  . Alcohol Use: No    Review of Systems  All other systems reviewed and are negative.     Allergies  Review of patient's allergies indicates no known allergies.  Home Medications   Prior to Admission medications   Medication Sig Start Date End Date Taking? Authorizing Provider  Azilsartan-Chlorthalidone (EDARBYCLOR) 40-25 MG TABS Take 1 tablet by mouth every morning. 06/13/14   Camelia Eng Tysinger, PA-C  chlordiazePOXIDE (LIBRIUM) 25 MG capsule Take 25 mg by mouth 3 (three) times daily as needed for anxiety.    Historical Provider, MD   fluticasone (FLONASE) 50 MCG/ACT nasal spray Place 2 sprays into both nostrils daily. 05/17/14   Harden Mo, MD  mometasone (NASONEX) 50 MCG/ACT nasal spray Place 2 sprays into the nose daily. 07/11/14   Denita Lung, MD  omeprazole (PRILOSEC) 40 MG capsule Take 1 capsule (40 mg total) by mouth daily. 06/13/14   Camelia Eng Tysinger, PA-C   BP 158/94  Pulse 69  Temp(Src) 98.1 F (36.7 C) (Oral)  Resp 18  Ht 5\' 10"  (1.778 m)  Wt 267 lb (121.11 kg)  BMI 38.31 kg/m2  SpO2 99% Physical Exam  Nursing note and vitals reviewed. Constitutional: He is oriented to person, place, and time. He appears well-developed and well-nourished.  HENT:  Head: Normocephalic and atraumatic.  Right Ear: External ear normal.  Left Ear: External ear normal.  Bilateral nasal mucosal injection with induration. No frank discharge or bleeding is appreciated.  Eyes: Conjunctivae and EOM are normal. Pupils are equal, round, and reactive to light.  Neck: Normal range of motion and phonation normal. Neck supple.  Cardiovascular: Normal rate, regular rhythm and normal heart sounds.   Pulmonary/Chest: Effort normal and breath sounds normal. He exhibits no bony tenderness.  Abdominal: Soft. There is no tenderness.  Musculoskeletal: Normal range of motion.  Neurological: He is alert and oriented to person, place, and time. No cranial nerve deficit or sensory deficit. He exhibits normal muscle tone. Coordination normal.  Skin: Skin is warm, dry and intact.  Psychiatric: He has a normal mood and affect. His behavior is normal. Judgment and thought content normal.    ED Course  Procedures (including critical care time) Medications  betamethasone acetate-betamethasone sodium phosphate (CELESTONE) injection 12 mg (12 mg Intramuscular Given 07/25/14 0749)    No data found.       Labs Review Labs Reviewed - No data to display  Imaging Review No results found.   EKG Interpretation None      MDM   Final  diagnoses:  Nasal congestion  Secondary hypertension, unspecified  Rhinitis medicamentosa   Nonspecific rhinitis with inappropriate use of Afrin. Doubt sinusitis or hypertensive urgency.  Nursing Notes Reviewed/ Care Coordinated Applicable Imaging Reviewed Interpretation of Laboratory Data incorporated into ED treatment  The patient appears reasonably screened and/or stabilized for discharge and I doubt any other medical condition or other [facility] requiring further screening, evaluation, or treatment in the ED at this time prior to discharge.  Plan: Home Medications- stop Afrin; Home Treatments- rest; return here if the recommended treatment, does not improve the symptoms; Recommended follow up- PCP prn  Richarda Blade, MD 07/26/14 1454

## 2014-07-25 NOTE — ED Notes (Signed)
Pt states he has been having problems with sinus congestion since August  Pt states he has been seen at urgent care and given medication that has not helped Pt states he has seen his PCP about it too and has tried other medication that has not helped either  Pt states he cannot sleep laying down and it is even hard to sleep sitting up   Pt states his blood pressure is elevated as well  Pt states he just wants to be able to breathe

## 2014-07-25 NOTE — Discharge Instructions (Signed)
Stop using the nasal decongestant spray.  Your blood pressure is high today, and may require treatment if it does not improve with cessation of the nasal decongestant spray.  Followup with her primary care doctor for a checkup in 2 or 3 days.

## 2014-08-08 ENCOUNTER — Ambulatory Visit (INDEPENDENT_AMBULATORY_CARE_PROVIDER_SITE_OTHER): Payer: 59 | Admitting: Medical

## 2014-08-08 ENCOUNTER — Encounter: Payer: Self-pay | Admitting: Medical

## 2014-08-08 VITALS — BP 100/68 | HR 68 | Temp 97.9°F | Resp 16 | Wt 241.0 lb

## 2014-08-08 DIAGNOSIS — R351 Nocturia: Secondary | ICD-10-CM

## 2014-08-08 DIAGNOSIS — T485X5A Adverse effect of other anti-common-cold drugs, initial encounter: Secondary | ICD-10-CM

## 2014-08-08 DIAGNOSIS — J310 Chronic rhinitis: Secondary | ICD-10-CM

## 2014-08-08 DIAGNOSIS — R0981 Nasal congestion: Secondary | ICD-10-CM

## 2014-08-08 DIAGNOSIS — R3911 Hesitancy of micturition: Secondary | ICD-10-CM

## 2014-08-08 DIAGNOSIS — R39198 Other difficulties with micturition: Secondary | ICD-10-CM

## 2014-08-08 DIAGNOSIS — R3919 Other difficulties with micturition: Secondary | ICD-10-CM

## 2014-08-08 MED ORDER — TAMSULOSIN HCL 0.4 MG PO CAPS: 0.4000 mg | ORAL_CAPSULE | Freq: Every day | ORAL | Status: DC

## 2014-08-08 MED ORDER — AZELASTINE-FLUTICASONE 137-50 MCG/ACT NA SUSP: 1.0000 | Freq: Two times a day (BID) | NASAL | Status: DC

## 2014-08-08 NOTE — Progress Notes (Signed)
Subjective: Here for f/u on nasal congestion.  He has been seen 3 times now for this, once with me, once with Dr. Redmond School here and recently in the ED.  He has tried Flonase, OTC allergy pill, Ipratropium, was using Afrin but has since stopped this.   Nothing has helped, and no prior similar leading up to several weeks ago when this started.  Not sleeping well due to nose being stopped up at night.   Has head pressure, yellow nasal drainage at times.  No sneezing, no itching, no sore throat, no cough, no wheezing, no prior problems with this until recently.   Using some Vicks vapor rub on nose.    Has several month hx/o gradual worsening urinary problems.  Gets up 2-3 times per night to urinate, often takes several minutes for urine to come out, has hesitancy, urgency, decreased stream.   No prior prostate exam.  Has some urinary frequency throughout the day.  No other c/o.  ROS as in subjective  Objective: Filed Vitals:   08/08/14 1053  BP: 100/68  Pulse: 68  Temp: 97.9 F (36.6 C)  Resp: 16   General appearance: alert, no distress, WD/WN HEENT: normocephalic, sclerae anicteric, TMs pearly, nares with turbinate edema, some dry mucoid discharge , mild erythema, pharynx normal Oral cavity: MMM, no lesions Neck: supple, no lymphadenopathy, no thyromegaly, no masses Heart: RRR, normal S1, S2, no murmurs Lungs: CTA bilaterally, no wheezes, rhonchi, or rales Abdomen: +bs, soft, non tender, non distended, no masses, no hepatomegaly, no splenomegaly Pulses: 2+ symmetric, upper and lower extremities, normal cap refill DRE: anus with small external hemorrhoid, prostate mildly enlarged, no distinct nodule, occult negative stool   Assessment: Encounter Diagnoses  Name Primary?  . Nasal congestion Yes  . Rhinitis medicamentosa   . Urinary hesitancy   . Decreased urine stream   . Nocturia     Plan: Nasal congestion, rhinitis medicamentosa - he has stopped Afrin.  Reviewed ED report for same.   At this point has tried 2 different oral antihistamine, Afrin, Flonase, Ipratropium nasal.   Will try sample of Dymista.  Discussed proper use, and while i was described proper use, he immediately snorted the Dymista in his nostril.  So I re-educated proper use of the nasal sprays, and he will trial of Dymista.    Urinary hesitance, decreased urine stream, nocturia - begin trial of Flomax.  Call report 2wk.

## 2014-09-02 ENCOUNTER — Emergency Department (HOSPITAL_COMMUNITY)
Admission: EM | Admit: 2014-09-02 | Discharge: 2014-09-02 | Disposition: A | Discharge status: Home / Self Care | Payer: 59 | Attending: Emergency Medicine | Admitting: Emergency Medicine

## 2014-09-02 ENCOUNTER — Encounter (HOSPITAL_COMMUNITY): Payer: Self-pay | Admitting: *Deleted

## 2014-09-02 DIAGNOSIS — R0981 Nasal congestion: Secondary | ICD-10-CM | POA: Diagnosis present

## 2014-09-02 DIAGNOSIS — Z79899 Other long term (current) drug therapy: Secondary | ICD-10-CM | POA: Diagnosis not present

## 2014-09-02 DIAGNOSIS — Z7951 Long term (current) use of inhaled steroids: Secondary | ICD-10-CM | POA: Insufficient documentation

## 2014-09-02 DIAGNOSIS — J069 Acute upper respiratory infection, unspecified: Secondary | ICD-10-CM

## 2014-09-02 DIAGNOSIS — I10 Essential (primary) hypertension: Secondary | ICD-10-CM | POA: Diagnosis not present

## 2014-09-02 DIAGNOSIS — Z87891 Personal history of nicotine dependence: Secondary | ICD-10-CM | POA: Insufficient documentation

## 2014-09-02 MED ORDER — OXYMETAZOLINE HCL 0.05 % NA SOLN
1.0000 | Freq: Once | NASAL | Status: AC
  Administered 2014-09-02: 1 via NASAL
  Filled 2014-09-02: qty 15

## 2014-09-02 MED ORDER — LORATADINE 10 MG PO TABS: 10.0000 mg | ORAL_TABLET | Freq: Every day | ORAL | Status: DC

## 2014-09-02 MED ORDER — TRIAMCINOLONE ACETONIDE 55 MCG/ACT NA AERO: 2.0000 | INHALATION_SPRAY | Freq: Every day | NASAL | Status: DC

## 2014-09-02 MED ORDER — LORATADINE 10 MG PO TABS
10.0000 mg | ORAL_TABLET | Freq: Once | ORAL | Status: AC
  Administered 2014-09-02: 10 mg via ORAL
  Filled 2014-09-02: qty 1

## 2014-09-02 NOTE — Discharge Instructions (Signed)
RECOMMEND MUCINEX, TYLENOL AND/OR IBUPROFEN - BOTH OVER-THE-COUNTER MEDICATIONS THAT WILL HELP TREATING SYMPTOMS. USE THE PRESCRIBED MEDICATIONS AS DIRECTED. FOLLOW UP WITH YOUR DOCTOR FOR RECHECK IN THE NEXT FEW DAYS IF SYMPTOMS PERSIST OR IF YOUR START RUNNING A FEVER. IF USING AFRIN, DO NOT USE LONGER THAN 3 CONSECUTIVE DAYS AS THIS WILL CAUSE A REBOUND CONGESTION.  Upper Respiratory Infection, Adult An upper respiratory infection (URI) is also sometimes known as the common cold. The upper respiratory tract includes the nose, sinuses, throat, trachea, and bronchi. Bronchi are the airways leading to the lungs. Most people improve within 1 week, but symptoms can last up to 2 weeks. A residual cough may last even longer.  CAUSES Many different viruses can infect the tissues lining the upper respiratory tract. The tissues become irritated and inflamed and often become very moist. Mucus production is also common. A cold is contagious. You can easily spread the virus to others by oral contact. This includes kissing, sharing a glass, coughing, or sneezing. Touching your mouth or nose and then touching a surface, which is then touched by another person, can also spread the virus. SYMPTOMS  Symptoms typically develop 1 to 3 days after you come in contact with a cold virus. Symptoms vary from person to person. They may include:  Runny nose.  Sneezing.  Nasal congestion.  Sinus irritation.  Sore throat.  Loss of voice (laryngitis).  Cough.  Fatigue.  Muscle aches.  Loss of appetite.  Headache.  Low-grade fever. DIAGNOSIS  You might diagnose your own cold based on familiar symptoms, since most people get a cold 2 to 3 times a year. Your caregiver can confirm this based on your exam. Most importantly, your caregiver can check that your symptoms are not due to another disease such as strep throat, sinusitis, pneumonia, asthma, or epiglottitis. Blood tests, throat tests, and X-rays are not  necessary to diagnose a common cold, but they may sometimes be helpful in excluding other more serious diseases. Your caregiver will decide if any further tests are required. RISKS AND COMPLICATIONS  You may be at risk for a more severe case of the common cold if you smoke cigarettes, have chronic heart disease (such as heart failure) or lung disease (such as asthma), or if you have a weakened immune system. The very young and very old are also at risk for more serious infections. Bacterial sinusitis, middle ear infections, and bacterial pneumonia can complicate the common cold. The common cold can worsen asthma and chronic obstructive pulmonary disease (COPD). Sometimes, these complications can require emergency medical care and may be life-threatening. PREVENTION  The best way to protect against getting a cold is to practice good hygiene. Avoid oral or hand contact with people with cold symptoms. Wash your hands often if contact occurs. There is no clear evidence that vitamin C, vitamin E, echinacea, or exercise reduces the chance of developing a cold. However, it is always recommended to get plenty of rest and practice good nutrition. TREATMENT  Treatment is directed at relieving symptoms. There is no cure. Antibiotics are not effective, because the infection is caused by a virus, not by bacteria. Treatment may include:  Increased fluid intake. Sports drinks offer valuable electrolytes, sugars, and fluids.  Breathing heated mist or steam (vaporizer or shower).  Eating chicken soup or other clear broths, and maintaining good nutrition.  Getting plenty of rest.  Using gargles or lozenges for comfort.  Controlling fevers with ibuprofen or acetaminophen as directed by your caregiver.  Increasing usage of your inhaler if you have asthma. Zinc gel and zinc lozenges, taken in the first 24 hours of the common cold, can shorten the duration and lessen the severity of symptoms. Pain medicines may help  with fever, muscle aches, and throat pain. A variety of non-prescription medicines are available to treat congestion and runny nose. Your caregiver can make recommendations and may suggest nasal or lung inhalers for other symptoms.  HOME CARE INSTRUCTIONS   Only take over-the-counter or prescription medicines for pain, discomfort, or fever as directed by your caregiver.  Use a warm mist humidifier or inhale steam from a shower to increase air moisture. This may keep secretions moist and make it easier to breathe.  Drink enough water and fluids to keep your urine clear or pale yellow.  Rest as needed.  Return to work when your temperature has returned to normal or as your caregiver advises. You may need to stay home longer to avoid infecting others. You can also use a face mask and careful hand washing to prevent spread of the virus. SEEK MEDICAL CARE IF:   After the first few days, you feel you are getting worse rather than better.  You need your caregiver's advice about medicines to control symptoms.  You develop chills, worsening shortness of breath, or brown or red sputum. These may be signs of pneumonia.  You develop yellow or brown nasal discharge or pain in the face, especially when you bend forward. These may be signs of sinusitis.  You develop a fever, swollen neck glands, pain with swallowing, or white areas in the back of your throat. These may be signs of strep throat. SEEK IMMEDIATE MEDICAL CARE IF:   You have a fever.  You develop severe or persistent headache, ear pain, sinus pain, or chest pain.  You develop wheezing, a prolonged cough, cough up blood, or have a change in your usual mucus (if you have chronic lung disease).  You develop sore muscles or a stiff neck. Document Released: 03/29/2001 Document Revised: 12/26/2011 Document Reviewed: 01/08/2014 [facility] Patient Information 2015 [place], [place]. This information is not intended to replace advice given to you by  your health care provider. Make sure you discuss any questions you have with your health care provider.

## 2014-09-02 NOTE — ED Provider Notes (Signed)
CSN: [id number]     Arrival date & time 09/02/14  2110 History  This chart was scribed for non-physician practitioner working with Ernestina Patches, MD by Molli Posey, ED Scribe. This patient was seen in room WTR6/WTR6 and the patient's care was started at 10:02 PM.      Chief Complaint  Patient presents with  . Nasal Congestion   HPI HPI Comments: Steven Church is a 63 y.o. male who presents to the Emergency Department complaining of nasal congestion for the past week. He reports that he has been blowing out thick green mucous. Pt states he has not slept in the past 2 days. He reports that OTC medications have not provided any relief. He denies fever, cough, and vomiting. He reports no modifying or alleviating factors at this time.    Past Medical History  Diagnosis Date  . Hypertension    History reviewed. No pertinent past surgical history. Family History  Problem Relation Age of Onset  . Hypertension Mother   . Diabetes Father    History  Substance Use Topics  . Smoking status: Former Research scientist (life sciences)  . Smokeless tobacco: Not on file  . Alcohol Use: No    Review of Systems  Constitutional: Negative for fever.  HENT: Positive for congestion.   Respiratory: Negative for cough.   Gastrointestinal: Negative for vomiting.      Allergies  Review of patient's allergies indicates no known allergies.  Home Medications   Prior to Admission medications   Medication Sig Start Date End Date Taking? Authorizing Provider  Azelastine-Fluticasone (DYMISTA) 137-50 MCG/ACT SUSP Place 1 spray into the nose 2 (two) times daily. 08/08/14   Camelia Eng Tysinger, PA-C  Azilsartan-Chlorthalidone (EDARBYCLOR) 40-25 MG TABS Take 1 tablet by mouth every morning. 06/13/14   Camelia Eng Tysinger, PA-C  omeprazole (PRILOSEC) 40 MG capsule Take 1 capsule (40 mg total) by mouth daily. 06/13/14   Camelia Eng Tysinger, PA-C  tamsulosin (FLOMAX) 0.4 MG CAPS capsule Take 1 capsule (0.4 mg total) by mouth daily.  08/08/14   Camelia Eng Tysinger, PA-C   BP 155/99 mmHg  Pulse 110  Temp(Src) 99.5 F (37.5 C) (Oral)  Resp 24  SpO2 96% Physical Exam  Constitutional: He is oriented to person, place, and time. He appears well-developed and well-nourished.  HENT:  Head: Normocephalic and atraumatic.  Nasal mucosal thickening.   Neck: Normal range of motion. Neck supple. No tracheal deviation present.  Cardiovascular: Normal rate.   Pulmonary/Chest: Effort normal. No respiratory distress.  Abdominal: He exhibits no distension.  Neurological: He is alert and oriented to person, place, and time.  Skin: Skin is warm and dry.  Psychiatric: He has a normal mood and affect. His behavior is normal.  Nursing note and vitals reviewed.   ED Course  Procedures  DIAGNOSTIC STUDIES: Oxygen Saturation is 96% on RA, normal by my interpretation.    COORDINATION OF CARE: 10:03 PM Discussed treatment plan with pt at bedside and pt agreed to plan.   Labs Review Labs Reviewed - No data to display  Imaging Review No results found.   EKG Interpretation None      MDM   Final diagnoses:  None   1. URI 2. Nasal congestion  URI can be treated supportively. Discussed all OTC measures and will prescribe steroidal nasal spray, Claritin. Encouraged follow up with PCP. I personally performed the services described in this documentation, which was scribed in my presence. The recorded information has been reviewed and is accurate.  Dewaine Oats, PA-C 09/03/14 [place], MD 09/03/14 [phone]

## 2014-09-02 NOTE — ED Notes (Signed)
Patient blowing out thick green mucous from his nose.

## 2014-09-02 NOTE — ED Notes (Signed)
Patient has had nasal congestion for 2 days. He has not been able to sleep due to inability to breathe out of his nose. He has tried over the counter decongestants with no relief. He denies chest congestion or any other cold symptoms.

## 2014-09-03 ENCOUNTER — Other Ambulatory Visit: Payer: Self-pay | Admitting: Family Medicine

## 2014-09-03 ENCOUNTER — Telehealth: Payer: Self-pay | Admitting: Medical

## 2014-09-03 ENCOUNTER — Ambulatory Visit (INDEPENDENT_AMBULATORY_CARE_PROVIDER_SITE_OTHER): Payer: 59 | Admitting: Medical

## 2014-09-03 ENCOUNTER — Encounter: Payer: Self-pay | Admitting: Medical

## 2014-09-03 VITALS — BP 130/80 | HR 84 | Temp 97.9°F | Resp 14 | Wt 240.0 lb

## 2014-09-03 DIAGNOSIS — J310 Chronic rhinitis: Secondary | ICD-10-CM

## 2014-09-03 DIAGNOSIS — N400 Enlarged prostate without lower urinary tract symptoms: Secondary | ICD-10-CM

## 2014-09-03 DIAGNOSIS — J0100 Acute maxillary sinusitis, unspecified: Secondary | ICD-10-CM

## 2014-09-03 MED ORDER — AMOXICILLIN 500 MG PO TABS: ORAL_TABLET | ORAL | Status: DC

## 2014-09-03 MED ORDER — PREDNISONE 20 MG PO TABS: ORAL_TABLET | ORAL | Status: DC

## 2014-09-03 NOTE — Progress Notes (Signed)
Subjective: Here for f/u on nasal congestion.  He has been seen 4 times now for this, a few times with me, once with Dr. Redmond School here and last night in the ED.  He has tried Flonase, OTC allergy pill, Ipratropium, Dymista, nasal saline, Nasacort, was using Afrin but has since stopped this.   Nothing has helped, and no prior similar leading up to several weeks ago when this started.  Not sleeping well due to nose being stopped up at night.   Has head pressure, yellow nasal drainage at times, worse this past week which prompted the ED visit last night.  No sneezing, no itching, no sore throat, no cough, no wheezing, no prior problems with this until recently.   Using some Vicks vapor rub on nose.    Has several month hx/o gradual worsening urinary problems.  Gets up 2-3 times per night to urinate, often takes several minutes for urine to come out, has hesitancy, urgency, decreased stream.   Has some urinary frequency throughout the day.   Since starting Flomax last visit has seen significant improvement.  No other c/o.  ROS as in subjective  Objective: Filed Vitals:   09/03/14 0909  BP: 130/80  Pulse: 84  Temp: 97.9 F (36.6 C)  Resp: 14   General appearance: alert, no distress, WD/WN HEENT: normocephalic, sclerae anicteric, TMs pearly, nares with turbinate significant edema, some mucoid discharge , mild erythema, pharynx with tonsillar swelling, mucoid exudates Oral cavity: MMM, no lesions Neck: supple, no lymphadenopathy, no thyromegaly, no masses    Assessment: Encounter Diagnoses  Name Primary?  . Subacute maxillary sinusitis Yes  . Chronic rhinitis   . BPH (benign prostatic hypertrophy)     Plan: Given the current exam findings and symptoms begin amoxicillin high dose, prednisone reduce inflammation, referral to ENT.  Continue Dymista nasal for now  BPH - improving on Flomax

## 2014-09-03 NOTE — Patient Instructions (Signed)
Let us go a round of treatment for sinus infection  begin amoxicillin 2 tablets twice a day for 10 days. This is an antibiotic  Use prednisone steroid tablet 2 tablets a day for 4 days  Drink plenty of water, try to do some nasal saline flush saltwater  We will go ahead and refer you to your nose and throat specialist

## 2014-09-03 NOTE — Telephone Encounter (Signed)
PATIENT IS AWARE OF THE APPOINTMENT AND ALL OF THE DETAILS THAT GO ALONG WITH THE APPOINTMENT

## 2014-09-03 NOTE — Telephone Encounter (Signed)
Patient has appointment with Dr. Lucia Gaskins on 09/05/14 @ 145 pm.

## 2014-09-03 NOTE — Telephone Encounter (Signed)
Refer to ENT specialist for chronic rhinitis, not responding to numerous medications

## 2014-11-17 ENCOUNTER — Other Ambulatory Visit: Payer: Self-pay | Admitting: Medical

## 2014-12-07 ENCOUNTER — Other Ambulatory Visit: Payer: Self-pay | Admitting: Medical

## 2015-01-08 ENCOUNTER — Other Ambulatory Visit: Payer: Self-pay | Admitting: Medical

## 2015-02-10 ENCOUNTER — Other Ambulatory Visit: Payer: Self-pay | Admitting: Medical

## 2015-02-14 ENCOUNTER — Other Ambulatory Visit: Payer: Self-pay | Admitting: Medical

## 2015-02-24 ENCOUNTER — Encounter: Payer: Self-pay | Admitting: Family Medicine

## 2015-02-24 ENCOUNTER — Ambulatory Visit (INDEPENDENT_AMBULATORY_CARE_PROVIDER_SITE_OTHER): Payer: 59 | Admitting: Family Medicine

## 2015-02-24 VITALS — BP 138/80 | HR 78

## 2015-02-24 DIAGNOSIS — N400 Enlarged prostate without lower urinary tract symptoms: Secondary | ICD-10-CM

## 2015-02-24 MED ORDER — FINASTERIDE 5 MG PO TABS: 5.0000 mg | ORAL_TABLET | Freq: Every day | ORAL | Status: DC

## 2015-02-24 NOTE — Progress Notes (Signed)
   Subjective:    Patient ID: Steven Church, male    DOB: 1950-11-05, 64 y.o.   MRN: [id number]  HPI He is here for consult concerning difficulty with urinary symptoms. He is presently on Flomax but states he is still having difficulty with decreased flow and incomplete emptying. He states that acid containing foods cause this. He cannot be anymore specific than that. He also complains of a one-day history of left flank pain when he moves. No history of injury. He has been using BC powder   Review of Systems     Objective:   Physical Exam Alert and in no distress. Slight tenderness palpation in the right posterior lateral rib area. Good motion of his back. Abdominal exam shows no masses or tenderness.       Assessment & Plan:  BPH (benign prostatic hypertrophy) - Plan: finasteride (PROSCAR) 5 MG tablet He is to continue on his other medications and I will add Proscar to his regimen. He also needs to come in for complete examination. Recommend continued conservative care for his back.

## 2015-02-27 ENCOUNTER — Other Ambulatory Visit: Payer: Self-pay | Admitting: Family Medicine

## 2015-02-27 ENCOUNTER — Ambulatory Visit
Admission: RE | Admit: 2015-02-27 | Discharge: 2015-02-27 | Disposition: A | Discharge status: Home / Self Care | Payer: 59 | Source: Ambulatory Visit | Attending: Medical | Admitting: Medical

## 2015-02-27 ENCOUNTER — Ambulatory Visit (HOSPITAL_COMMUNITY)
Admission: RE | Admit: 2015-02-27 | Discharge: 2015-02-27 | Disposition: A | Discharge status: Home / Self Care | Payer: 59 | Source: Ambulatory Visit | Attending: Medical | Admitting: Medical

## 2015-02-27 ENCOUNTER — Ambulatory Visit (HOSPITAL_COMMUNITY)
Admission: EM | Admit: 2015-02-27 | Discharge: 2015-02-27 | Disposition: A | Discharge status: Home / Self Care | Payer: 59 | Source: Intra-hospital | Attending: Emergency Medicine | Admitting: Emergency Medicine

## 2015-02-27 ENCOUNTER — Encounter (HOSPITAL_COMMUNITY): Payer: Self-pay

## 2015-02-27 ENCOUNTER — Other Ambulatory Visit: Payer: Self-pay | Admitting: Medical

## 2015-02-27 ENCOUNTER — Ambulatory Visit (INDEPENDENT_AMBULATORY_CARE_PROVIDER_SITE_OTHER): Payer: 59 | Admitting: Medical

## 2015-02-27 ENCOUNTER — Encounter: Payer: Self-pay | Admitting: Medical

## 2015-02-27 VITALS — BP 100/70 | HR 70 | Temp 98.1°F | Resp 15 | Wt 228.0 lb

## 2015-02-27 DIAGNOSIS — M8580 Other specified disorders of bone density and structure, unspecified site: Secondary | ICD-10-CM | POA: Insufficient documentation

## 2015-02-27 DIAGNOSIS — R091 Pleurisy: Secondary | ICD-10-CM

## 2015-02-27 DIAGNOSIS — R899 Unspecified abnormal finding in specimens from other organs, systems and tissues: Secondary | ICD-10-CM

## 2015-02-27 DIAGNOSIS — R0781 Pleurodynia: Secondary | ICD-10-CM

## 2015-02-27 DIAGNOSIS — R109 Unspecified abdominal pain: Secondary | ICD-10-CM | POA: Diagnosis not present

## 2015-02-27 DIAGNOSIS — R590 Localized enlarged lymph nodes: Secondary | ICD-10-CM | POA: Diagnosis not present

## 2015-02-27 DIAGNOSIS — N400 Enlarged prostate without lower urinary tract symptoms: Secondary | ICD-10-CM

## 2015-02-27 LAB — POCT URINALYSIS DIPSTICK
BILIRUBIN UA: NEGATIVE
Blood, UA: NEGATIVE
Glucose, UA: NEGATIVE
Ketones, UA: NEGATIVE
Leukocytes, UA: NEGATIVE
NITRITE UA: NEGATIVE
Protein, UA: NEGATIVE
Spec Grav, UA: 1.025
Urobilinogen, UA: NEGATIVE
pH, UA: 6

## 2015-02-27 LAB — CBC WITH DIFFERENTIAL/PLATELET
BASOS ABS: 0 10*3/uL (ref 0.0–0.1)
Basophils Relative: 0 % (ref 0–1)
EOS PCT: 4 % (ref 0–5)
Eosinophils Absolute: 0.3 10*3/uL (ref 0.0–0.7)
HCT: 37.6 % — ABNORMAL LOW (ref 39.0–52.0)
Hemoglobin: 12.9 g/dL — ABNORMAL LOW (ref 13.0–17.0)
LYMPHS PCT: 30 % (ref 12–46)
Lymphs Abs: 2.1 10*3/uL (ref 0.7–4.0)
MCH: 28.3 pg (ref 26.0–34.0)
MCHC: 34.3 g/dL (ref 30.0–36.0)
MCV: 82.5 fL (ref 78.0–100.0)
MPV: 10.4 fL (ref 8.6–12.4)
Monocytes Absolute: 0.6 10*3/uL (ref 0.1–1.0)
Monocytes Relative: 9 % (ref 3–12)
NEUTROS PCT: 57 % (ref 43–77)
Neutro Abs: 4 10*3/uL (ref 1.7–7.7)
PLATELETS: 278 10*3/uL (ref 150–400)
RBC: 4.56 MIL/uL (ref 4.22–5.81)
RDW: 14.2 % (ref 11.5–15.5)
WBC: 7 10*3/uL (ref 4.0–10.5)

## 2015-02-27 LAB — BASIC METABOLIC PANEL
BUN: 29 mg/dL — ABNORMAL HIGH (ref 6–23)
CO2: 25 mEq/L (ref 19–32)
Calcium: 9.6 mg/dL (ref 8.4–10.5)
Chloride: 104 mEq/L (ref 96–112)
Creat: 1.26 mg/dL (ref 0.50–1.35)
Glucose, Bld: 80 mg/dL (ref 70–99)
Potassium: 3.8 mEq/L (ref 3.5–5.3)
Sodium: 139 mEq/L (ref 135–145)

## 2015-02-27 LAB — D-DIMER, QUANTITATIVE: D-Dimer, Quant: 1.61 ug/mL-FEU — ABNORMAL HIGH (ref 0.00–0.48)

## 2015-02-27 MED ORDER — HYDROCODONE-ACETAMINOPHEN 5-325 MG PO TABS: 1.0000 | ORAL_TABLET | Freq: Four times a day (QID) | ORAL | Status: DC | PRN

## 2015-02-27 MED ORDER — IOHEXOL 350 MG/ML SOLN
75.0000 mL | Freq: Once | INTRAVENOUS | Status: AC | PRN
Start: 2015-02-27 — End: 2015-02-27
  Administered 2015-02-27: 75 mL via INTRAVENOUS

## 2015-02-27 NOTE — Progress Notes (Signed)
Subjective: Here for f/u.  Saw Dr. Redmond School 2 days ago for same.  Here for c/o side pain, rib pain, urinary and prostate problems.   Had been having some BPH symptoms for a while including nocturia, hesitation, incomplete voiding.  Had been on Flomax and Dr. Redmond School 2 days ago added finasteride.  He is worried that the prostate issue may be related to his side pain.  He notes that sometimes if eating citric acid, citrus, makes him take a long time to urinate, hesitancy.  This puts pressure on his kidneys to the point it starts hurting.  Will have to urinate multiple times to feel completely voiding.  He notes pain in left and right ribs somewhat posterolaterally, worse if taking deep breath, can give a jolt of pain.  Been using BC powders the last few days as advised by Dr. Redmond School and its not helping.   He is worried because of his job.  He moves around a lot on the job, bend over a lot on the job and the rib pain hurts to bad to bend over a lot.   He denies fever, weight loss, no NVD, no constipation, no bowel changes, no other chest pain, no SOB, no wheezing, no palpations, no edema, no URI symptoms, no recent injury or trauma.  No other aggravating or relieving factors. No other complaint.  Past Medical History  Diagnosis Date  . Hypertension    ROS as in subjective  Objective: BP 100/70 mmHg  Pulse 70  Temp(Src) 98.1 F (36.7 C)  Resp 15  Wt 228 lb (103.42 kg)  Gen: wd, wn, nad Skin: unremarkable, no rash, no shingles Lungs clear Heart: RRR, normal s1, s2, no murmurs abdomen +bs, soft, nontender, no organomegaly, unremarkable Back nontender Chest wall - tender along right posterolateral chest wall and left mid lateral chest wall Ext: no edema Pulses normal No calve pain, swelling deformity or asymmetry   Assessment: Encounter Diagnoses  Name Primary?  . Rib pain Yes  . Pleurisy   . Flank pain   . BPH (benign prostatic hypertrophy)      Plan: discussed symptoms,  reviewed UA.    Advised that BPH symptoms have nothing to do with his other concerns today.  He likely has costochondritis unrelated to any of the other symptoms, but given the presentation, will send for CXR, get STAT labs, and BPH lab.    Hydrate well, rest, script for Hydrocodone for pain, c/t Proscar but increase Flomax to BID.  F/u pending studies.  Bayani was seen today for right side pain.  Diagnoses and all orders for this visit:  Rib pain Orders: -     Basic metabolic panel -     CBC with Differential/Platelet -     D-dimer, quantitative -     DG Chest 2 View; Future  Pleurisy Orders: -     Basic metabolic panel -     CBC with Differential/Platelet -     D-dimer, quantitative -     DG Chest 2 View; Future  Flank pain Orders: -     Basic metabolic panel -     CBC with Differential/Platelet -     D-dimer, quantitative -     DG Chest 2 View; Future  BPH (benign prostatic hypertrophy) Orders: -     PSA

## 2015-03-06 ENCOUNTER — Encounter: Payer: Self-pay | Admitting: Medical

## 2015-03-06 ENCOUNTER — Other Ambulatory Visit: Payer: Self-pay | Admitting: Family Medicine

## 2015-03-06 ENCOUNTER — Ambulatory Visit (INDEPENDENT_AMBULATORY_CARE_PROVIDER_SITE_OTHER): Payer: 59 | Admitting: Medical

## 2015-03-06 ENCOUNTER — Telehealth: Payer: Self-pay | Admitting: Medical

## 2015-03-06 VITALS — BP 98/60 | HR 86

## 2015-03-06 DIAGNOSIS — R0789 Other chest pain: Secondary | ICD-10-CM | POA: Diagnosis not present

## 2015-03-06 DIAGNOSIS — R599 Enlarged lymph nodes, unspecified: Secondary | ICD-10-CM | POA: Diagnosis not present

## 2015-03-06 DIAGNOSIS — R938 Abnormal findings on diagnostic imaging of other specified body structures: Secondary | ICD-10-CM

## 2015-03-06 DIAGNOSIS — R0781 Pleurodynia: Secondary | ICD-10-CM | POA: Diagnosis not present

## 2015-03-06 DIAGNOSIS — R9389 Abnormal findings on diagnostic imaging of other specified body structures: Secondary | ICD-10-CM

## 2015-03-06 DIAGNOSIS — Z87891 Personal history of nicotine dependence: Secondary | ICD-10-CM | POA: Diagnosis not present

## 2015-03-06 DIAGNOSIS — R590 Localized enlarged lymph nodes: Secondary | ICD-10-CM

## 2015-03-06 NOTE — Progress Notes (Signed)
Subjective: Here today along with his sister.  Here today for f/u on CT. I saw him last week for chest wall pain, pleuritic chest pain and BPH symptoms.  He had also seen Steven Church here last week too. His chest CT was abnormal and thus here to discuss.  He notes no additional chest wall pain and pleuritic chest pain, although chest wall still is sore.     He recently was started finasteride by [name] here and was already on Flomax for BPH symptoms.  He denies any fever, sweats, weight loss, fatigue, or other new symptoms.   He had been in normal state of health prior to last visit.   He has not come in for a physical since starting here as a patient 05/2014. He doesn't recall a prior colonoscopy.  He is a former smoker, smoking somewhat less than 1ppd since age 33yo up until age 64yo.    Past Medical History  Diagnosis Date  . Hypertension    History reviewed. No pertinent past surgical history.   ROS as in subjective  Objective: BP 98/60 mmHg  Pulse 86  General appearance: alert, no distress, WD/WN, AA male HEENT: normocephalic, sclerae anicteric, TMs pearly, nares patent, no discharge or erythema, pharynx normal Oral cavity: MMM, no lesions Neck: supple, no lymphadenopathy, no thyromegaly, no masses Breasts: no lumps, no skin changes, no axillary or supraclavicular nodes palpable Heart: RRR, normal S1, S2, no murmurs Lungs: CTA bilaterally, no wheezes, rhonchi, or rales Abdomen: +bs, soft, non tender, non distended, no masses, no hepatomegaly, no splenomegaly Pulses: 2+ symmetric, upper and lower extremities, normal cap refill DRE: prostate maybe slightly enlarged, no nodules, occult negative stool     Assessment: Encounter Diagnoses  Name Primary?  . Hilar lymphadenopathy Yes  . Chest wall pain   . Abnormal chest CT   . Pleuritic chest pain   . Former smoker     Plan: Reviewed the recent labs, chest xray, chest CT.   I had called and briefly spoke to Steven Church  with [facility] about next steps given the hilar adenopathy and sclerotic dentist of ribs and vertebra on CT chest.    Discussed findings with patient, possible causes.   Discussed recent symptoms that prompted the evaluation.  At this point discussed eating a healthier diet, will pursue SPEP and UPEP, PSA lab, and referral to oncology.  Of note, he will need a screening colonoscopy too as this has never been done prior per his recollection.  He does have a significant smoking history.     F/u pending labs.  answered his questions, and he is aware of pending evaluation.

## 2015-03-06 NOTE — Telephone Encounter (Signed)
Was Denice Paradise able to call lab and change the previously ordered PSA to free and total?    Does patient have info on returning urine jug and labs for SPEP and UPEP labs for Monday?  I wanted to make sure since it looks like the PSA I ordered today got canceled.

## 2015-03-06 NOTE — Telephone Encounter (Signed)
ORDERS TO SEE DR. SHADAD IN ONCOLOGY ARE IN EPIC. ONCOLOGY WILL CONTACT THE PATIENT FOR HIS REFERRAL.

## 2015-03-06 NOTE — Telephone Encounter (Signed)
Refer to Oncology, preferably Dr. [name] for abnormal chest CT, mediastinal lymphadenopathy, concern for malignancy.

## 2015-03-07 LAB — PSA: PSA: 899.8 ng/mL — ABNORMAL HIGH (ref <=4.00)

## 2015-03-08 ENCOUNTER — Other Ambulatory Visit: Payer: Self-pay | Admitting: Medical

## 2015-03-09 ENCOUNTER — Other Ambulatory Visit: Payer: Self-pay | Admitting: *Deleted

## 2015-03-09 ENCOUNTER — Telehealth: Payer: Self-pay | Admitting: *Deleted

## 2015-03-09 DIAGNOSIS — R918 Other nonspecific abnormal finding of lung field: Secondary | ICD-10-CM

## 2015-03-09 NOTE — Telephone Encounter (Signed)
Oncology Nurse Navigator Documentation  Oncology Nurse Navigator Flowsheets 03/09/2015  Referral date to RadOnc/MedOnc [id number]  Navigator Encounter Type Introductory phone call.  Left vm message for appt with Dr. Julien Nordmann 03/12/15 arrive at [facility] at 12:30.  I also left my name and phone number to call with any questions  Interventions Coordination of Care  Coordination of Care MD Appointments  Time Spent with Patient 15

## 2015-03-09 NOTE — Telephone Encounter (Signed)
Denice Paradise said that she couldn't change the order on the PSA at the lab so the only thing you would get for the PSA is the total. She said when the patient comes in to drop off the urine for the 24 hour urine lab test. She said she would order PSA free. Dorothea Ogle PA is aware of this message.

## 2015-03-10 ENCOUNTER — Telehealth: Payer: Self-pay | Admitting: *Deleted

## 2015-03-10 NOTE — Telephone Encounter (Signed)
Oncology Nurse Navigator Documentation  Oncology Nurse Navigator Flowsheets 03/10/2015  Referral date to RadOnc/MedOnc -  Navigator Encounter Type Clinic/MDC  Interventions Coordination of Care.  Called to set up appt for [place].  I left vm message of appt time and place.  I also left my name and phone number to call if needed   Coordination of Care MD Appointments  Time Spent with Patient 5

## 2015-03-11 ENCOUNTER — Other Ambulatory Visit: Payer: Self-pay | Admitting: Family Medicine

## 2015-03-11 ENCOUNTER — Telehealth: Payer: Self-pay | Admitting: *Deleted

## 2015-03-11 ENCOUNTER — Other Ambulatory Visit: Payer: 59

## 2015-03-11 DIAGNOSIS — R590 Localized enlarged lymph nodes: Secondary | ICD-10-CM

## 2015-03-11 DIAGNOSIS — R972 Elevated prostate specific antigen [PSA]: Secondary | ICD-10-CM

## 2015-03-11 NOTE — Telephone Encounter (Signed)
Called and left a message for the pt w/ a friendly reminder about clinic tomorrow.

## 2015-03-11 NOTE — Telephone Encounter (Signed)
Oncology Nurse Navigator Documentation  Oncology Nurse Navigator Flowsheets 03/11/2015  Referral date to RadOnc/MedOnc -  Navigator Encounter Type Introductory phone call;Clinic/MDC.  Called left vm message to call regarding appt for tomorrow.    Interventions Coordination of Care  Coordination of Care MD Appointments  Time Spent with Patient 5

## 2015-03-11 NOTE — Progress Notes (Signed)
I spoke with the patient and he states that he will bring the urine into the office today.

## 2015-03-12 ENCOUNTER — Telehealth: Payer: Self-pay | Admitting: Internal Medicine

## 2015-03-12 ENCOUNTER — Encounter: Payer: Self-pay | Admitting: *Deleted

## 2015-03-12 ENCOUNTER — Other Ambulatory Visit: Payer: Self-pay

## 2015-03-12 ENCOUNTER — Other Ambulatory Visit (HOSPITAL_BASED_OUTPATIENT_CLINIC_OR_DEPARTMENT_OTHER): Payer: 59

## 2015-03-12 ENCOUNTER — Ambulatory Visit: Payer: 59 | Admitting: Physical Therapy

## 2015-03-12 ENCOUNTER — Encounter: Payer: Self-pay | Admitting: Internal Medicine

## 2015-03-12 ENCOUNTER — Ambulatory Visit (HOSPITAL_BASED_OUTPATIENT_CLINIC_OR_DEPARTMENT_OTHER): Payer: 59 | Admitting: Internal Medicine

## 2015-03-12 VITALS — BP 125/69 | HR 57 | Temp 98.0°F | Resp 18 | Ht 70.0 in | Wt 225.8 lb

## 2015-03-12 DIAGNOSIS — R0781 Pleurodynia: Secondary | ICD-10-CM | POA: Diagnosis not present

## 2015-03-12 DIAGNOSIS — R590 Localized enlarged lymph nodes: Secondary | ICD-10-CM

## 2015-03-12 DIAGNOSIS — R599 Enlarged lymph nodes, unspecified: Secondary | ICD-10-CM

## 2015-03-12 DIAGNOSIS — Z809 Family history of malignant neoplasm, unspecified: Secondary | ICD-10-CM

## 2015-03-12 DIAGNOSIS — R918 Other nonspecific abnormal finding of lung field: Secondary | ICD-10-CM

## 2015-03-12 DIAGNOSIS — Z87891 Personal history of nicotine dependence: Secondary | ICD-10-CM

## 2015-03-12 LAB — CBC WITH DIFFERENTIAL/PLATELET
BASO%: 0.4 % (ref 0.0–2.0)
Basophils Absolute: 0 10*3/uL (ref 0.0–0.1)
EOS%: 3.1 % (ref 0.0–7.0)
Eosinophils Absolute: 0.2 10*3/uL (ref 0.0–0.5)
HCT: 38.1 % — ABNORMAL LOW (ref 38.4–49.9)
HGB: 12.6 g/dL — ABNORMAL LOW (ref 13.0–17.1)
LYMPH%: 27.2 % (ref 14.0–49.0)
MCH: 28.1 pg (ref 27.2–33.4)
MCHC: 33 g/dL (ref 32.0–36.0)
MCV: 85.2 fL (ref 79.3–98.0)
MONO#: 0.6 10*3/uL (ref 0.1–0.9)
MONO%: 7.5 % (ref 0.0–14.0)
NEUT#: 4.7 10*3/uL (ref 1.5–6.5)
NEUT%: 61.8 % (ref 39.0–75.0)
Platelets: 237 10*3/uL (ref 140–400)
RBC: 4.47 10*6/uL (ref 4.20–5.82)
RDW: 14.5 % (ref 11.0–14.6)
WBC: 7.5 10*3/uL (ref 4.0–10.3)
lymph#: 2 10*3/uL (ref 0.9–3.3)

## 2015-03-12 LAB — COMPREHENSIVE METABOLIC PANEL (CC13)
ALBUMIN: 3.2 g/dL — AB (ref 3.5–5.0)
ALT: 22 U/L (ref 0–55)
ANION GAP: 12 meq/L — AB (ref 3–11)
AST: 20 U/L (ref 5–34)
Alkaline Phosphatase: 344 U/L — ABNORMAL HIGH (ref 40–150)
BUN: 23.3 mg/dL (ref 7.0–26.0)
CHLORIDE: 105 meq/L (ref 98–109)
CO2: 23 meq/L (ref 22–29)
CREATININE: 1.3 mg/dL (ref 0.7–1.3)
Calcium: 9.3 mg/dL (ref 8.4–10.4)
EGFR: 69 mL/min/{1.73_m2} — ABNORMAL LOW (ref >90)
GLUCOSE: 107 mg/dL (ref 70–140)
Potassium: 3.9 mEq/L (ref 3.5–5.1)
SODIUM: 140 meq/L (ref 136–145)
Total Bilirubin: 0.44 mg/dL (ref 0.20–1.20)
Total Protein: 7.9 g/dL (ref 6.4–8.3)

## 2015-03-12 NOTE — Progress Notes (Signed)
Oncology Nurse Navigator Documentation  Oncology Nurse Navigator Flowsheets 03/12/2015  Referral date to RadOnc/MedOnc -  Navigator Encounter Type Clinic/MDC  Patient Visit Type Initial.  Meet patient today at [facility].  He has abnormal scans but no tissue dx.  Patient has work up scans and biopsy going to be scheduled.    Treatment Phase Other  Barriers/Navigation Needs Education  Interventions Education Method  Coordination of Care Other  Time Spent with Patient 15         Thoracic Treatment Summary Name:Steven Church Date:03/12/2015 DOB:08/09/1951 Your Medical Team Medical Oncologist:Dr. Mohamed  Type and Stage of Lung Cancer  Clinical Stage:  No pathology   Clinical stage is based on radiology exams.  Pathological stage will be determined after surgery.  Staging is based on the size of the tumor, involvement of lymph nodes or not, and whether or not the cancer center has spread. Recommendations Recommendations: Staging workup and tissue diagnosis  These recommendations are based on information available as of today's consult.  This is subject to change depending further testing or exams. Next Steps Next Step: Medical Oncology will set up follow up appointments Barriers to Care What do you perceive as a potential barrier that may prevent you from receiving your treatment plan? Education information regarding cancer center and support services given and explained     Resources Given: Nurse, learning disability.[name] [phone]      Questions [name], RN BSN Thoracic Oncology Nurse Navigator at [facility] is a nurse navigator that is available to assist you through your cancer journey.  She can answer your questions and/or provide resources regarding your treatment plan, emotional support, or financial concerns.

## 2015-03-12 NOTE — Progress Notes (Signed)
[place] [phone]   [phone] Multidisciplinary thoracic oncology clinic  CONSULT NOTE  REFERRING PHYSICIAN: Chana Bode, PA-C  REASON FOR CONSULTATION:  64 years old African-American male with mediastinal lymphadenopathy.  HPI Steven Church is a 64 y.o. male was past medical history significant for hypertension, benign prostatic hypertrophy, allergic rhinitis. The patient also has a long history of smoking but quit a year ago. The patient was seen by his primary care physician complaining of urinary symptoms with nocturia as well as hesitancy. He was treated with Proscar and Flomax. He also complained of pain at the lower rib cage bilaterally that was getting worse when he raises his arms. Has been taking a lot of over-the-counter aspirin. He was also given prescription of hydrocodone with little improvement. Chest x-ray followed by CT angiogram of the chest was performed on 02/27/2015 and it showed mediastinal adenopathy is noted measuring 5.2 x 3.7 cm in the aortopulmonary window. Left peritracheal lymph node measuring 28 x 22 mm is noted. Pretracheal adenopathy is noted with largest lymph node measuring 32 x 17 mm. Old right rib fracture is noted. Several ill-defined sclerotic densities are noted in several lower ribs bilaterally as well as T8 vertebral body which may represent metastatic disease. The patient was referred to me today for further evaluation and recommendation regarding these abnormalities. When seen today he is feeling fine with no specific complaints. He denied having any chest pain or shortness of breath. He has mild cough but no hemoptysis. He denied having any significant nausea or vomiting. He has no significant weight loss or night sweats. He has no headache or visual changes. Family history significant for mother who died from old age and father died from complication of diabetes with leg amputation. Her sister also had  cancer but doesn't know the type.  The patient is single and has one son. He works as a Medical illustrator. The patient has a history of smoking less than one pack per day for around 44 years but quit a year ago. He has no history of alcohol or drug abuse.  HPI  Past Medical History  Diagnosis Date  . Hypertension     History reviewed. No pertinent past surgical history.  Family History  Problem Relation Age of Onset  . Hypertension Mother   . Diabetes Father     Social History History  Substance Use Topics  . Smoking status: Former Smoker -- 1.00 packs/day for 18 years  . Smokeless tobacco: Not on file  . Alcohol Use: No    No Known Allergies  Current Outpatient Prescriptions  Medication Sig Dispense Refill  . EDARBYCLOR 40-25 MG TABS TAKE 1 TABLET BY MOUTH EVERY MORNING. 30 tablet 1  . finasteride (PROSCAR) 5 MG tablet Take 1 tablet (5 mg total) by mouth daily. 30 tablet 11  . fluticasone (FLONASE) 50 MCG/ACT nasal spray Place into both nostrils daily.    Marland Kitchen HYDROcodone-acetaminophen (NORCO/VICODIN) 5-325 MG per tablet Take 1 tablet by mouth every 6 (six) hours as needed. for pain  0  . omeprazole (PRILOSEC) 40 MG capsule TAKE 1 CAPSULE (40 MG TOTAL) BY MOUTH DAILY. 30 capsule 5  . tamsulosin (FLOMAX) 0.4 MG CAPS capsule TAKE ONE CAPSULE BY MOUTH EVERY DAY 30 capsule 2   No current facility-administered medications for this visit.    Review of Systems  Constitutional: negative Eyes: negative Ears, nose, mouth, throat, and face: negative Respiratory: positive for cough Cardiovascular:  negative Gastrointestinal: negative Genitourinary:negative Integument/breast: negative Hematologic/lymphatic: negative Musculoskeletal:negative Neurological: negative Behavioral/Psych: negative Endocrine: negative Allergic/Immunologic: negative  Physical Exam  KDX:IPJAS, healthy, no distress, well nourished, well developed and anxious SKIN: skin  color, texture, turgor are normal, no rashes or significant lesions HEAD: Normocephalic, No masses, lesions, tenderness or abnormalities EYES: normal, PERRLA EARS: External ears normal, Canals clear OROPHARYNX:no exudate, no erythema and lips, buccal mucosa, and tongue normal  NECK: supple, no adenopathy, no JVD LYMPH:  no palpable lymphadenopathy, no hepatosplenomegaly LUNGS: clear to auscultation , and palpation HEART: regular rate & rhythm, no murmurs and no gallops ABDOMEN:abdomen soft, non-tender, obese, normal bowel sounds and no masses or organomegaly BACK: Back symmetric, no curvature., No CVA tenderness EXTREMITIES:no joint deformities, effusion, or inflammation, no edema, no skin discoloration, no clubbing  NEURO: alert & oriented x 3 with fluent speech, no focal motor/sensory deficits  PERFORMANCE STATUS: ECOG 1  LABORATORY DATA: Lab Results  Component Value Date   WBC 7.5 03/12/2015   HGB 12.6* 03/12/2015   HCT 38.1* 03/12/2015   MCV 85.2 03/12/2015   PLT 237 03/12/2015      Chemistry      Component Value Date/Time   NA 140 03/12/2015 1155   NA 139 02/27/2015 0001   K 3.9 03/12/2015 1155   K 3.8 02/27/2015 0001   CL 104 02/27/2015 0001   CO2 23 03/12/2015 1155   CO2 25 02/27/2015 0001   BUN 23.3 03/12/2015 1155   BUN 29* 02/27/2015 0001   CREATININE 1.3 03/12/2015 1155   CREATININE 1.26 02/27/2015 0001   CREATININE 1.10 09/30/2011 1453      Component Value Date/Time   CALCIUM 9.3 03/12/2015 1155   CALCIUM 9.6 02/27/2015 0001   ALKPHOS 344* 03/12/2015 1155   AST 20 03/12/2015 1155   ALT 22 03/12/2015 1155   BILITOT 0.44 03/12/2015 1155       RADIOGRAPHIC STUDIES: Dg Chest 2 View  02/27/2015   CLINICAL DATA:  Right chest pain for 1 week  EXAM: CHEST  2 VIEW  COMPARISON:  02/08/2009  FINDINGS: Cardiomediastinal silhouette is stable. No acute infiltrate or pleural effusion. No pulmonary edema. Stable bilateral basilar linear atelectasis or scarring. Bony  thorax is unremarkable  IMPRESSION: No active cardiopulmonary disease.   Electronically Signed   By: Lahoma Crocker M.D.   On: 02/27/2015 14:10   Ct Angio Chest Pe W/cm &/or Wo Cm  02/27/2015   CLINICAL DATA:  Elevated D-dimer level.  Bilateral rib pain.  EXAM: CT ANGIOGRAPHY CHEST WITH CONTRAST  TECHNIQUE: Multidetector CT imaging of the chest was performed using the standard protocol during bolus administration of intravenous contrast. Multiplanar CT image reconstructions and MIPs were obtained to evaluate the vascular anatomy.  CONTRAST:  59mL OMNIPAQUE IOHEXOL 350 MG/ML SOLN  COMPARISON:  None.  FINDINGS: No pneumothorax or pleural effusion is noted. Mild bilateral posterior basilar subsegmental atelectasis is noted. There is no definite evidence of pulmonary embolus. Visualized portion of upper abdomen appears normal. Mediastinal adenopathy is noted measuring 5.2 x 3.7 cm in the aortopulmonary window. Left peritracheal lymph node measuring 28 x 22 mm is noted. Pretracheal adenopathy is noted with largest lymph node measuring 32 x 17 mm. Old right rib fracture is noted. Several ill-defined sclerotic densities are noted in several lower ribs bilaterally as well as T8 vertebral body which may represent metastatic disease.  Review of the MIP images confirms the above findings.  IMPRESSION: No evidence of pulmonary embolus.  Mediastinal adenopathy is noted as  described above concerning for metastatic disease or other malignancy.  Several ill-defined sclerotic densities are noted in the ribs bilaterally as well as T8 vertebral body which may represent metastatic disease. Bone scan may be performed for further evaluation.   Electronically Signed   By: Marijo Conception, M.D.   On: 02/27/2015 19:59    ASSESSMENT: This is a very pleasant 64 years old African-American male who was recently found to have mediastinal lymphadenopathy as well as suspicious bone metastasis concerning for malignancy.   PLAN: I had a lengthy  discussion with the patient today about his current condition and further investigation to confirm a diagnosis. I showed him the images of the CT scan of the chest. The patient is very anxious. I assured him that we don't have a diagnosis yet to confirm a malignancy but we will need to run a few tests for evaluation of his condition. I recommended for the patient to have a PET scan performed first. Based on the PET scan results I may consider the patient for tissue biopsy and this site of biopsy will be chosen based on the PET scan results. I will arrange for the patient to come back for follow-up visit in 2 weeks for reevaluation and more detailed discussion of his condition. The patient was seen during the multidisciplinary thoracic oncology clinic today by medical oncology, thoracic navigator as well as social worker. He was advised to call immediately if he has any concerning symptoms in the interval.  The patient voices understanding of current disease status and treatment options and is in agreement with the current care plan.  All questions were answered. The patient knows to call the clinic with any problems, questions or concerns. We can certainly see the patient much sooner if necessary.  Thank you so much for allowing me to participate in the care of ALVON NYGAARD. I will continue to follow up the patient with you and assist in his care.  I spent 40 minutes counseling the patient face to face. The total time spent in the appointment was 60 minutes.  Disclaimer: This note was dictated with voice recognition software. Similar sounding words can inadvertently be transcribed and may not be corrected upon review.   Malkia Nippert K. Mar 12, 2015, 1:48 PM

## 2015-03-12 NOTE — Telephone Encounter (Signed)
Pt confirmed labs/ov per 05/26 POF, gave pt AVS and Calendar..... KJ °

## 2015-03-12 NOTE — Progress Notes (Signed)
[place] Clinical Social Work  Clinical Social Work met with patient and Futures trader at [employer] appointment to offer support and assess for psychosocial needs.  Medical oncologist reviewed patient's scans and recommended PET scan be completed.  Steven Church shared he was much more at ease after discussion with physician and plans to continue praying that he does not have cancer.  He reported his faith and prayer are main causes of support.  Patient has one son who lives in [place].  He works as a Investment banker, corporate.  He shared he enjoys his job.  CSW repeated physicians reccommendation for more testing.    Clinical Social Work briefly discussed Clinical Social Work role and [employer] support programs/services.  Clinical Social Work encouraged patient to call with any additional questions or concerns.   Polo Riley, MSW, LCSW, OSW-C Clinical Social Worker [facility] [phone]

## 2015-03-20 ENCOUNTER — Ambulatory Visit (HOSPITAL_COMMUNITY)
Admission: RE | Admit: 2015-03-20 | Discharge: 2015-03-20 | Disposition: A | Discharge status: Home / Self Care | Payer: 59 | Source: Ambulatory Visit | Attending: Internal Medicine | Admitting: Internal Medicine

## 2015-03-20 ENCOUNTER — Other Ambulatory Visit: Payer: 59

## 2015-03-20 ENCOUNTER — Other Ambulatory Visit (HOSPITAL_BASED_OUTPATIENT_CLINIC_OR_DEPARTMENT_OTHER): Payer: 59

## 2015-03-20 DIAGNOSIS — R599 Enlarged lymph nodes, unspecified: Secondary | ICD-10-CM

## 2015-03-20 DIAGNOSIS — C7951 Secondary malignant neoplasm of bone: Secondary | ICD-10-CM | POA: Diagnosis not present

## 2015-03-20 DIAGNOSIS — R590 Localized enlarged lymph nodes: Secondary | ICD-10-CM

## 2015-03-20 DIAGNOSIS — R591 Generalized enlarged lymph nodes: Secondary | ICD-10-CM | POA: Diagnosis not present

## 2015-03-20 DIAGNOSIS — C801 Malignant (primary) neoplasm, unspecified: Secondary | ICD-10-CM | POA: Insufficient documentation

## 2015-03-20 DIAGNOSIS — R918 Other nonspecific abnormal finding of lung field: Secondary | ICD-10-CM

## 2015-03-20 LAB — COMPREHENSIVE METABOLIC PANEL (CC13)
ALK PHOS: 250 U/L — AB (ref 40–150)
ALT: 34 U/L (ref 0–55)
ANION GAP: 10 meq/L (ref 3–11)
AST: 29 U/L (ref 5–34)
Albumin: 2.8 g/dL — ABNORMAL LOW (ref 3.5–5.0)
BILIRUBIN TOTAL: 0.66 mg/dL (ref 0.20–1.20)
BUN: 31.1 mg/dL — ABNORMAL HIGH (ref 7.0–26.0)
CO2: 24 mEq/L (ref 22–29)
Calcium: 9.2 mg/dL (ref 8.4–10.4)
Chloride: 107 mEq/L (ref 98–109)
Creatinine: 1.4 mg/dL — ABNORMAL HIGH (ref 0.7–1.3)
EGFR: 58 mL/min/{1.73_m2} — ABNORMAL LOW (ref >90)
Glucose: 106 mg/dl (ref 70–140)
Potassium: 3.4 mEq/L — ABNORMAL LOW (ref 3.5–5.1)
SODIUM: 140 meq/L (ref 136–145)
TOTAL PROTEIN: 8 g/dL (ref 6.4–8.3)

## 2015-03-20 LAB — CBC WITH DIFFERENTIAL/PLATELET
BASO%: 0.3 % (ref 0.0–2.0)
Basophils Absolute: 0 10*3/uL (ref 0.0–0.1)
EOS ABS: 0.2 10*3/uL (ref 0.0–0.5)
EOS%: 3.1 % (ref 0.0–7.0)
HCT: 34.9 % — ABNORMAL LOW (ref 38.4–49.9)
HGB: 12 g/dL — ABNORMAL LOW (ref 13.0–17.1)
LYMPH#: 1.9 10*3/uL (ref 0.9–3.3)
LYMPH%: 24 % (ref 14.0–49.0)
MCH: 28.8 pg (ref 27.2–33.4)
MCHC: 34.4 g/dL (ref 32.0–36.0)
MCV: 83.9 fL (ref 79.3–98.0)
MONO#: 0.7 10*3/uL (ref 0.1–0.9)
MONO%: 8.6 % (ref 0.0–14.0)
NEUT#: 5 10*3/uL (ref 1.5–6.5)
NEUT%: 64 % (ref 39.0–75.0)
PLATELETS: 256 10*3/uL (ref 140–400)
RBC: 4.16 10*6/uL — AB (ref 4.20–5.82)
RDW: 13.9 % (ref 11.0–14.6)
WBC: 7.8 10*3/uL (ref 4.0–10.3)

## 2015-03-20 LAB — GLUCOSE, CAPILLARY: GLUCOSE-CAPILLARY: 100 mg/dL — AB (ref 65–99)

## 2015-03-20 MED ORDER — FLUDEOXYGLUCOSE F - 18 (FDG) INJECTION: 11.8000 | Freq: Once | INTRAVENOUS | Status: AC | PRN

## 2015-03-25 ENCOUNTER — Telehealth (HOSPITAL_COMMUNITY): Payer: Self-pay | Admitting: *Deleted

## 2015-03-25 ENCOUNTER — Telehealth: Payer: Self-pay | Admitting: Oncology

## 2015-03-25 ENCOUNTER — Ambulatory Visit (HOSPITAL_BASED_OUTPATIENT_CLINIC_OR_DEPARTMENT_OTHER): Payer: 59 | Admitting: Oncology

## 2015-03-25 ENCOUNTER — Encounter: Payer: Self-pay | Admitting: Oncology

## 2015-03-25 VITALS — BP 128/76 | HR 85 | Temp 98.0°F | Resp 18 | Ht 70.0 in | Wt 224.9 lb

## 2015-03-25 DIAGNOSIS — C61 Malignant neoplasm of prostate: Secondary | ICD-10-CM

## 2015-03-25 DIAGNOSIS — R599 Enlarged lymph nodes, unspecified: Secondary | ICD-10-CM

## 2015-03-25 DIAGNOSIS — C7951 Secondary malignant neoplasm of bone: Secondary | ICD-10-CM

## 2015-03-25 DIAGNOSIS — R590 Localized enlarged lymph nodes: Secondary | ICD-10-CM

## 2015-03-25 NOTE — Progress Notes (Signed)
No images are attached to the encounter. No scans are attached to the encounter. No scans are attached to the encounter. 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 male returns for a follow-up visit to discuss PET scan results. He was initially seen in our multidisciplinary thoracic oncology clinic for mediastinal lymphadenopathy. The patient overall feels well today. He is eating well and his weight is stable. He denies chest pain, shortness of breath, nausea, vomiting, constipation, diarrhea. Denies difficulty with urination. Denies hematuria. He denies bleeding. He states that he is unable to feel any palpable adenopathy.  MEDICAL HISTORY: Past Medical History  Diagnosis Date  . Hypertension     ALLERGIES:  has No Known Allergies.  MEDICATIONS:  Current Outpatient Prescriptions  Medication Sig Dispense Refill  . EDARBYCLOR 40-25 MG TABS TAKE 1 TABLET BY MOUTH EVERY MORNING. 30 tablet 1  . finasteride (PROSCAR) 5 MG tablet Take 1 tablet (5 mg total) by mouth daily. 30 tablet 11  . fluticasone (FLONASE) 50 MCG/ACT nasal spray Place into both nostrils daily.    Marland Kitchen HYDROcodone-acetaminophen (NORCO/VICODIN) 5-325 MG per tablet Take 1 tablet by mouth every 6 (six) hours as needed. for pain  0  . omeprazole (PRILOSEC) 40 MG capsule TAKE 1 CAPSULE (40 MG TOTAL) BY MOUTH DAILY. 30 capsule 5  . tamsulosin (FLOMAX) 0.4 MG CAPS capsule TAKE ONE CAPSULE BY MOUTH EVERY DAY 30 capsule 2   No current facility-administered medications for this visit.    SURGICAL HISTORY: History reviewed. No pertinent past surgical history.  REVIEW OF SYSTEMS:  Review of Systems  Constitutional: Negative.   HENT: Negative.   Eyes: Negative.    Respiratory: Negative.   Cardiovascular: Negative.   Gastrointestinal: Negative.   Genitourinary: Negative.   Musculoskeletal: Negative.   Skin: Negative.   Neurological: Negative.   Endo/Heme/Allergies: Negative.   Psychiatric/Behavioral: Negative.      PHYSICAL EXAMINATION: Physical Exam  Constitutional: He is oriented to person, place, and time and well-developed, well-nourished, and in no distress.  HENT:  Head: Normocephalic and atraumatic.  Mouth/Throat: Oropharynx is clear and moist.  Eyes: Conjunctivae and EOM are normal. Pupils are equal, round, and reactive to light.  Neck: Normal range of motion. Neck supple. No thyromegaly present.  Cardiovascular: Normal rate, regular rhythm, normal heart sounds and intact distal pulses.   Pulmonary/Chest: Effort normal and breath sounds normal.  Abdominal: Soft. Bowel sounds are normal. He exhibits no mass. There is no tenderness.  Musculoskeletal: Normal range of motion. He exhibits no edema.  Neurological: He is alert and oriented to person, place, and time. Gait normal.  Skin: Skin is warm and dry.  Psychiatric: Mood, memory, affect and judgment normal.    ECOG PERFORMANCE STATUS: 0 - Asymptomatic  Blood pressure 128/76, pulse 85, temperature 98 F (36.7 C), temperature source Oral, resp. rate 18, height 5\' 10"  (1.778 m), weight 224 lb 14.4 oz (102.014 kg), SpO2 99 %.  LABORATORY DATA: Lab Results  Component Value Date   WBC 7.8 03/20/2015   HGB 12.0* 03/20/2015   HCT 34.9* 03/20/2015   MCV 83.9 03/20/2015   PLT 256 03/20/2015      Chemistry      Component Value Date/Time   NA 140 03/20/2015 1122   NA 139 02/27/2015 0001  K 3.4* 03/20/2015 1122   K 3.8 02/27/2015 0001   CL 104 02/27/2015 0001   CO2 24 03/20/2015 1122   CO2 25 02/27/2015 0001   BUN 31.1* 03/20/2015 1122   BUN 29* 02/27/2015 0001   CREATININE 1.4* 03/20/2015 1122   CREATININE 1.26 02/27/2015 0001   CREATININE 1.10 09/30/2011 1453       Component Value Date/Time   CALCIUM 9.2 03/20/2015 1122   CALCIUM 9.6 02/27/2015 0001   ALKPHOS 250* 03/20/2015 1122   AST 29 03/20/2015 1122   ALT 34 03/20/2015 1122   BILITOT 0.66 03/20/2015 1122       RADIOGRAPHIC STUDIES:  Dg Chest 2 View  02/27/2015   CLINICAL DATA:  Right chest pain for 1 week  EXAM: CHEST  2 VIEW  COMPARISON:  02/08/2009  FINDINGS: Cardiomediastinal silhouette is stable. No acute infiltrate or pleural effusion. No pulmonary edema. Stable bilateral basilar linear atelectasis or scarring. Bony thorax is unremarkable  IMPRESSION: No active cardiopulmonary disease.   Electronically Signed   By: Lahoma Crocker M.D.   On: 02/27/2015 14:10   Ct Angio Chest Pe W/cm &/or Wo Cm  02/27/2015   CLINICAL DATA:  Elevated D-dimer level.  Bilateral rib pain.  EXAM: CT ANGIOGRAPHY CHEST WITH CONTRAST  TECHNIQUE: Multidetector CT imaging of the chest was performed using the standard protocol during bolus administration of intravenous contrast. Multiplanar CT image reconstructions and MIPs were obtained to evaluate the vascular anatomy.  CONTRAST:  34mL OMNIPAQUE IOHEXOL 350 MG/ML SOLN  COMPARISON:  None.  FINDINGS: No pneumothorax or pleural effusion is noted. Mild bilateral posterior basilar subsegmental atelectasis is noted. There is no definite evidence of pulmonary embolus. Visualized portion of upper abdomen appears normal. Mediastinal adenopathy is noted measuring 5.2 x 3.7 cm in the aortopulmonary window. Left peritracheal lymph node measuring 28 x 22 mm is noted. Pretracheal adenopathy is noted with largest lymph node measuring 32 x 17 mm. Old right rib fracture is noted. Several ill-defined sclerotic densities are noted in several lower ribs bilaterally as well as T8 vertebral body which may represent metastatic disease.  Review of the MIP images confirms the above findings.  IMPRESSION: No evidence of pulmonary embolus.  Mediastinal adenopathy is noted as described above concerning for  metastatic disease or other malignancy.  Several ill-defined sclerotic densities are noted in the ribs bilaterally as well as T8 vertebral body which may represent metastatic disease. Bone scan may be performed for further evaluation.   Electronically Signed   By: Marijo Conception, M.D.   On: 02/27/2015 19:59   Nm Pet Image Initial (pi) Skull Base To Thigh  03/20/2015   CLINICAL DATA:  Initial treatment strategy for mediastinal lymphadenopathy.  EXAM: NUCLEAR MEDICINE PET SKULL BASE TO THIGH  TECHNIQUE: 11.2 mCi F-18 FDG was injected intravenously. Full-ring PET imaging was performed from the skull base to thigh after the radiotracer. CT data was obtained and used for attenuation correction and anatomic localization.  FASTING BLOOD GLUCOSE:  Value: 100 mg/dl  COMPARISON:  Chest CT 02/27/2015  FINDINGS: NECK  No hypermetabolic lymph nodes in the neck.  CHEST  As demonstrated on the recent CT scan there is mediastinal lymphadenopathy which is hypermetabolic.  The pretracheal nodal disease has an SUV max of 3.4.  The aorticopulmonary window adenopathy is 5.9.  The left paraesophageal nodal disease is 7.4.  No definite pulmonary nodules or primary lung neoplasm. Mild emphysematous changes. No pleural effusion.  ABDOMEN/PELVIS  Abnormal FDG activity noted in  the mid central portion of the prostate gland with SUV max of 10.2. This is worrisome for primary prostate carcinoma. It is not in the peripheral zone as typically seen.  There is adjacent perirectal lymphadenopathy, pelvic sidewall adenopathy, sigmoid mesocolon adenopathy and bulky retroperitoneal adenopathy, right greater than left. This ends just above the iliac crest.  The largest nodal mass on the right side at the level of the aortic bifurcation measures 6.0 x 5.2 cm and has an SUV max of 10.0.  3 adjacent sigmoid mesocolon lymph nodes measure 10.5, 20 and 23 mm. SUV max is 9.1.  No inguinal lymphadenopathy.  SKELETON  Diffuse hypermetabolic skeletal  metastasis involving the spine, ribs, sternum and pelvis. No pathologic fractures are identified.  Index lesion in the left twelfth rib has an SUV max of 9.67.  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 There is bulky pelvic lymphadenopathy, mediastinal lymphadenopathy and diffuse osseous metastatic disease.   Electronically Signed   By: Marijo Sanes M.D.   On: 03/20/2015 16:20     ASSESSMENT/PLAN:  No problem-specific assessment & plan notes found for this encounter.  Mr. Steven Church is a 64 year old African-American male who was referred to [place] for mediastinal lymphadenopathy. He also had suspicious bone metastasis concerning for malignancy.   The patient was seen and examined with Dr. Julien Nordmann. PET scan was obtained and was reviewed with the patient today. PET scan results are most consistent with prostate carcinoma involving the mid central gland. There is bulky pelvic lymphadenopathy, mediastinal lymphadenopathy and diffuse osseous metastatic disease. In review of the chart, the patient had a PSA drawn on 02/27/2015. The PSA result was 899.8.  Discussed with the patient that we recommend that he be followed up by Dr. Zola Button who is more familiar with the treatment for prostate cancer. The patient has been set up for a biopsy of his bulky adenopathy in anticipation of his visit with Dr [name].   The patient has been scheduled back as a new patient for Dr [name] in approximately 2 weeks to discuss his diagnosis and treatment options.  All questions were answered. The patient knows to call the clinic with any problems, questions or concerns. We can certainly see the patient much sooner if necessary.   Mikey Bussing, DNP, AGPCNP-BC, AOCNP 03/25/2015  ADDENDUM: Hematology/Oncology Attending: I had a face to face  encounter with the patient today. I recommended his care plan. This is a very pleasant 63 years old African-American male who was initially evaluated for mediastinal lymphadenopathy and suspicious bone lesion. The patient had a PET scan performed recently which showed findings consistent with prostate carcinoma with mid central gland lesion as well as bulky pelvic, mediastinal lymphadenopathy as well as diffuse osseous metastatic disease. His PSA was also elevated at 899.8. I had a lengthy discussion with the patient today about his condition. I recommended for him to see my partner Dr. Dr. [name] for further evaluation and management of his prostate cancer. He is expected to see Dr. [name] in the recent 2 weeks for evaluation and management of his condition. He was advised to call in the interval if he has any concerning symptoms.  Disclaimer: This note was dictated with voice recognition software. Similar sounding words can inadvertently be transcribed and may be missed upon review. Eilleen Kempf., MD 03/25/2015

## 2015-03-25 NOTE — Telephone Encounter (Signed)
Received phone call from Nicole Kindred in Radiology scheduling. Patient does not have anyone to drive him for biopsy. Patient will be receiving sedation and policy requires a driver. Spoke with Mikey Bussing NP who ordered biopsy.  She will discuss with Dr. [name].  Gave her Tony's call back number [phone]

## 2015-03-25 NOTE — Telephone Encounter (Signed)
Gave adn printed appt sched and avs for pt for JUNE °

## 2015-03-26 ENCOUNTER — Telehealth (HOSPITAL_COMMUNITY): Payer: Self-pay | Admitting: *Deleted

## 2015-03-26 ENCOUNTER — Encounter: Payer: Self-pay | Admitting: *Deleted

## 2015-03-26 NOTE — Progress Notes (Signed)
Spoke with patient, states he needs to know when his bx is scheduled, so he may ask a family member to drive him home. Lm on vm for toni in central scheduling to call patient tomorrow between 8 and 12, before he goes to work, with a date and time.

## 2015-03-26 NOTE — Progress Notes (Signed)
Lm for patient to call me, [id number].

## 2015-04-02 ENCOUNTER — Other Ambulatory Visit: Payer: Self-pay | Admitting: Radiology

## 2015-04-03 ENCOUNTER — Ambulatory Visit (HOSPITAL_COMMUNITY)
Admission: RE | Admit: 2015-04-03 | Discharge: 2015-04-03 | Disposition: A | Discharge status: Home / Self Care | Payer: 59 | Source: Ambulatory Visit | Attending: Oncology | Admitting: Oncology

## 2015-04-03 ENCOUNTER — Encounter (HOSPITAL_COMMUNITY): Payer: Self-pay

## 2015-04-03 DIAGNOSIS — Z8546 Personal history of malignant neoplasm of prostate: Secondary | ICD-10-CM | POA: Insufficient documentation

## 2015-04-03 DIAGNOSIS — R590 Localized enlarged lymph nodes: Secondary | ICD-10-CM

## 2015-04-03 DIAGNOSIS — R599 Enlarged lymph nodes, unspecified: Secondary | ICD-10-CM | POA: Insufficient documentation

## 2015-04-03 LAB — CBC
HCT: 35.4 % — ABNORMAL LOW (ref 39.0–52.0)
Hemoglobin: 11.6 g/dL — ABNORMAL LOW (ref 13.0–17.0)
MCH: 28 pg (ref 26.0–34.0)
MCHC: 32.8 g/dL (ref 30.0–36.0)
MCV: 85.5 fL (ref 78.0–100.0)
PLATELETS: 268 10*3/uL (ref 150–400)
RBC: 4.14 MIL/uL — ABNORMAL LOW (ref 4.22–5.81)
RDW: 14.4 % (ref 11.5–15.5)
WBC: 7.4 10*3/uL (ref 4.0–10.5)

## 2015-04-03 LAB — PROTIME-INR
INR: 1.16 (ref 0.00–1.49)
PROTHROMBIN TIME: 15 s (ref 11.6–15.2)

## 2015-04-03 LAB — APTT: aPTT: 27 seconds (ref 24–37)

## 2015-04-03 MED ORDER — FENTANYL CITRATE (PF) 100 MCG/2ML IJ SOLN
INTRAMUSCULAR | Status: AC | PRN
  Administered 2015-04-03: 50 ug via INTRAVENOUS

## 2015-04-03 MED ORDER — MIDAZOLAM HCL 2 MG/2ML IJ SOLN
INTRAMUSCULAR | Status: AC
  Filled 2015-04-03: qty 6

## 2015-04-03 MED ORDER — FENTANYL CITRATE (PF) 100 MCG/2ML IJ SOLN
INTRAMUSCULAR | Status: AC
  Filled 2015-04-03: qty 6

## 2015-04-03 MED ORDER — SODIUM CHLORIDE 0.9 % IV SOLN
INTRAVENOUS | Status: DC
  Administered 2015-04-03: 07:00:00 via INTRAVENOUS

## 2015-04-03 MED ORDER — MIDAZOLAM HCL 2 MG/2ML IJ SOLN
INTRAMUSCULAR | Status: AC | PRN
  Administered 2015-04-03: 1 mg via INTRAVENOUS

## 2015-04-03 NOTE — H&P (Signed)
Referring Physician(s): Curcio,Kristin R  History of Present Illness: Steven Church is a 64 y.o. male with mediastinal and retroperitoneal lymphadenopathy and diffuse osseous metastatic disease on PET 03/20/15. Patient also with elevated PSA with concern for prostate carcinoma. He has been scheduled today for image guided right retroperitoneal lymph node biopsy and will follow up with Dr. [name] following the biopsy. He denies any chest pain, shortness of breath or palpitations. He does c/o left lower chest wall tenderness after lifting something heavy recently. He denies any active signs of bleeding or excessive bruising. He denies any recent fever or chills. The patient denies any history of sleep apnea or chronic oxygen use. He denies any known complications to sedation but has never had it before.   Past Medical History  Diagnosis Date  . Hypertension    History reviewed. No pertinent past surgical history.  Allergies: Review of patient's allergies indicates no known allergies.  Medications: Prior to Admission medications   Medication Sig Start Date End Date Taking? Authorizing Provider  Aspirin-Caffeine (BC FAST PAIN RELIEF PO) Take 1 Package by mouth as needed (pain).   Yes Historical Provider, MD  EDARBYCLOR 40-25 MG TABS TAKE 1 TABLET BY MOUTH EVERY MORNING. 03/09/15  Yes Camelia Eng Tysinger, PA-C  finasteride (PROSCAR) 5 MG tablet Take 1 tablet (5 mg total) by mouth daily. 02/24/15  Yes Denita Lung, MD  fluticasone [facility]) 50 MCG/ACT nasal spray Place 2 sprays into both nostrils daily.    Yes Historical Provider, MD  HYDROcodone-acetaminophen (NORCO/VICODIN) 5-325 MG per tablet Take 1 tablet by mouth every 6 (six) hours as needed for moderate pain or severe pain.  02/27/15  Yes Historical Provider, MD  omeprazole (PRILOSEC) 40 MG capsule TAKE 1 CAPSULE (40 MG TOTAL) BY MOUTH DAILY. 12/08/14  Yes Camelia Eng Tysinger, PA-C  tamsulosin (FLOMAX) 0.4 MG CAPS capsule TAKE ONE CAPSULE  BY MOUTH EVERY DAY Patient taking differently: TAKE 0.4 MG BY MOUTH EVERY DAY 02/10/15  Yes Carlena Hurl, PA-C     Family History  Problem Relation Age of Onset  . Hypertension Mother   . Diabetes Father     History   Social History  . Marital Status: Divorced    Spouse Name: N/A  . Number of Children: N/A  . Years of Education: N/A   Social History Main Topics  . Smoking status: Former Smoker -- 1.00 packs/day for 18 years  . Smokeless tobacco: Not on file  . Alcohol Use: No  . Drug Use: No  . Sexual Activity: Yes   Other Topics Concern  . None   Social History Narrative   Review of Systems: A 12 point ROS discussed and pertinent positives are indicated in the HPI above.  All other systems are negative.  Review of Systems  Vital Signs: BP 137/74 mmHg  Pulse 75  Temp(Src) 98.2 F (36.8 C) (Oral)  Resp 18  SpO2 95%  Physical Exam  Constitutional: He is oriented to person, place, and time. No distress.  HENT:  Head: Normocephalic and atraumatic.  Neck: No tracheal deviation present.  Cardiovascular: Normal rate and regular rhythm.  Exam reveals no gallop and no friction rub.   No murmur heard. Pulmonary/Chest: Effort normal and breath sounds normal. No respiratory distress. He has no wheezes. He has no rales.  Abdominal: Soft. Bowel sounds are normal.  Neurological: He is alert and oriented to person, place, and time.  Skin: He is not diaphoretic.  Psychiatric: He has a  normal mood and affect. His behavior is normal. Thought content normal.    Mallampati Score:  MD Evaluation Airway: WNL Heart: WNL Abdomen: WNL Chest/ Lungs: WNL ASA  Classification: 2 Mallampati/Airway Score: Two  Imaging: Nm Pet Image Initial (pi) Skull Base To Thigh  03/20/2015   CLINICAL DATA:  Initial treatment strategy for mediastinal lymphadenopathy.  EXAM: NUCLEAR MEDICINE PET SKULL BASE TO THIGH  TECHNIQUE: 11.2 mCi F-18 FDG was injected intravenously. Full-ring PET imaging  was performed from the skull base to thigh after the radiotracer. CT data was obtained and used for attenuation correction and anatomic localization.  FASTING BLOOD GLUCOSE:  Value: 100 mg/dl  COMPARISON:  Chest CT 02/27/2015  FINDINGS: NECK  No hypermetabolic lymph nodes in the neck.  CHEST  As demonstrated on the recent CT scan there is mediastinal lymphadenopathy which is hypermetabolic.  The pretracheal nodal disease has an SUV max of 3.4.  The aorticopulmonary window adenopathy is 5.9.  The left paraesophageal nodal disease is 7.4.  No definite pulmonary nodules or primary lung neoplasm. Mild emphysematous changes. No pleural effusion.  ABDOMEN/PELVIS  Abnormal FDG activity noted in the mid central portion of the prostate gland with SUV max of 10.2. This is worrisome for primary prostate carcinoma. It is not in the peripheral zone as typically seen.  There is adjacent perirectal lymphadenopathy, pelvic sidewall adenopathy, sigmoid mesocolon adenopathy and bulky retroperitoneal adenopathy, right greater than left. This ends just above the iliac crest.  The largest nodal mass on the right side at the level of the aortic bifurcation measures 6.0 x 5.2 cm and has an SUV max of 10.0.  3 adjacent sigmoid mesocolon lymph nodes measure 10.5, 20 and 23 mm. SUV max is 9.1.  No inguinal lymphadenopathy.  SKELETON  Diffuse hypermetabolic skeletal metastasis involving the spine, ribs, sternum and pelvis. No pathologic fractures are identified.  Index lesion in the left twelfth rib has an SUV max of 9.67.  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 There is bulky pelvic lymphadenopathy, mediastinal lymphadenopathy and diffuse osseous metastatic disease.   Electronically Signed   By: Marijo Sanes M.D.   On: 03/20/2015 16:20     Labs:  CBC:  Recent Labs  02/27/15 0001 03/12/15 1155 03/20/15 1122 04/03/15 0705  WBC 7.0 7.5 7.8 7.4  HGB 12.9* 12.6* 12.0* 11.6*  HCT 37.6* 38.1* 34.9* 35.4*  PLT 278 237 256 268    COAGS:  Recent Labs  04/03/15 0705  INR 1.16  APTT 27    BMP:  Recent Labs  02/27/15 0001 03/12/15 1155 03/20/15 1122  NA 139 140 140  K 3.8 3.9 3.4*  CL 104  --   --   CO2 25 23 24   GLUCOSE 80 107 106  BUN 29* 23.3 31.1*  CALCIUM 9.6 9.3 9.2  CREATININE 1.26 1.3 1.4*    LIVER FUNCTION TESTS:  Recent Labs  03/12/15 1155 03/20/15 1122  BILITOT 0.44 0.66  AST 20 29  ALT 22 34  ALKPHOS 344* 250*  PROT 7.9 8.0  ALBUMIN 3.2* 2.8*   Assessment and Plan: Lymphadenopathy with diffuse osseous metastatic disease on PET 03/20/15 Elevated PSA Scheduled today for image guided right retroperitoneal lymph node biopsy with sedation The patient has been NPO, no blood thinners taken, labs and vitals have been reviewed.  Risks and Benefits discussed with the patient including, but not limited to bleeding, infection, damage to adjacent structures or low yield requiring additional tests. All of the patient's questions were answered, patient is agreeable to proceed. Consent signed and in chart. HTN BPH   Thank you for this interesting consult.  I greatly enjoyed meeting Steven Church and look forward to participating in their care.  SignedHedy Jacob 04/03/2015, 8:24 AM   I spent a total of 30 minutes in face to face in clinical consultation, greater than 50% of which was counseling/coordinating care for retroperitoneal lymphadenopathy.

## 2015-04-03 NOTE — Procedures (Signed)
Interventional Radiology Procedure Note  Procedure: CT guided biopsy of retroperitoneal mass.  4 x 18G core biopsy.  Complications: No immediate Recommendations:  - Routine care. - observe 1 hour - follow up result  Signed,  Dulcy Fanny. Earleen Newport, DO

## 2015-04-03 NOTE — Discharge Instructions (Addendum)
°  Incision Care An incision is when a surgeon cuts into your body tissues. After surgery, the incision needs to be cared for properly to prevent infection.  HOME CARE INSTRUCTIONS   Take all medicine as directed by your caregiver. Only take over-the-counter or prescription medicines for pain, discomfort, or fever as directed by your caregiver.  Keep dressing clean and dry for 24hrs, after which you may shower.  Take showers. Do not take tub baths, swim, or do anything that may soak the wound until it is healed.  You may cover the incision site with a fresh bandaid if needed for the first day or two.  Resume your normal diet and activities as directed or allowed.  Avoid lifting any weight until you are instructed otherwise.  Do not use creams or lotions on the incision site. Do not pick or scratch at the wound.  Drink enough fluids to keep your urine clear or pale yellow.  Call for your biopsy results as directed by your provider.  SEEK MEDICAL CARE IF:   You have redness, swelling, or increasing pain in the wound that is not controlled with medicine.  You have drainage, blood, or pus coming from the wound that lasts longer than 1 day.  You develop muscle aches, chills, or a general ill feeling.  You notice a bad smell coming from the wound or dressing.  Your wound edges separate after the sutures, staples, or skin adhesive strips have been removed.  You develop persistent nausea or vomiting.   SEEK IMMEDIATE MEDICAL CARE IF:   You have a fever.  You develop a rash.  You develop dizzy episodes or faint while standing.  You have difficulty breathing.  You develop any reaction or side effects to medicine given.   MAKE SURE YOU:   Understand these instructions.  Will watch your condition.  Will get help right away if you are not doing well or get worse. Document Released: 04/22/2005 Document Revised: 12/26/2011 Document Reviewed: 11/27/2013 [facility] Patient  Information 2015 [place], [place]. This information is not intended to replace advice given to you by your health care provider. Make sure you discuss any questions you have with your health care provider.

## 2015-04-07 ENCOUNTER — Other Ambulatory Visit: Payer: Self-pay | Admitting: Oncology

## 2015-04-07 ENCOUNTER — Encounter: Payer: Self-pay | Admitting: Oncology

## 2015-04-07 DIAGNOSIS — C61 Malignant neoplasm of prostate: Secondary | ICD-10-CM | POA: Insufficient documentation

## 2015-04-07 HISTORY — DX: Malignant neoplasm of prostate: C61

## 2015-04-10 ENCOUNTER — Telehealth: Payer: Self-pay | Admitting: Oncology

## 2015-04-10 ENCOUNTER — Other Ambulatory Visit: Payer: 59

## 2015-04-10 ENCOUNTER — Ambulatory Visit (HOSPITAL_BASED_OUTPATIENT_CLINIC_OR_DEPARTMENT_OTHER): Payer: 59 | Admitting: Oncology

## 2015-04-10 ENCOUNTER — Ambulatory Visit (HOSPITAL_BASED_OUTPATIENT_CLINIC_OR_DEPARTMENT_OTHER): Payer: 59

## 2015-04-10 VITALS — BP 125/72 | HR 66 | Temp 97.8°F | Resp 18 | Ht 70.0 in | Wt 221.7 lb

## 2015-04-10 DIAGNOSIS — C778 Secondary and unspecified malignant neoplasm of lymph nodes of multiple regions: Secondary | ICD-10-CM

## 2015-04-10 DIAGNOSIS — Z5111 Encounter for antineoplastic chemotherapy: Secondary | ICD-10-CM

## 2015-04-10 DIAGNOSIS — C61 Malignant neoplasm of prostate: Secondary | ICD-10-CM | POA: Diagnosis not present

## 2015-04-10 DIAGNOSIS — C7951 Secondary malignant neoplasm of bone: Secondary | ICD-10-CM

## 2015-04-10 MED ORDER — LEUPROLIDE ACETATE (3 MONTH) 22.5 MG IM KIT
22.5000 mg | PACK | Freq: Once | INTRAMUSCULAR | Status: AC
  Administered 2015-04-10: 22.5 mg via INTRAMUSCULAR
  Filled 2015-04-10: qty 22.5

## 2015-04-10 MED ORDER — BICALUTAMIDE 50 MG PO TABS: 50.0000 mg | ORAL_TABLET | Freq: Every day | ORAL | Status: DC

## 2015-04-10 NOTE — Telephone Encounter (Signed)
Gave patient avs report and appointments for August. Per FS patient injections will be every 4 months and next injection will be scheduled when he see patient again in 2 months. Patient aware.

## 2015-04-10 NOTE — Patient Instructions (Signed)
Leuprolide depot injection or implant What is this medicine? LEUPROLIDE (loo PROE lide) is a man-made protein that acts like a natural hormone in the body. It decreases testosterone in men and decreases estrogen in women. In men, this medicine is used to treat advanced prostate cancer. In women, some forms of this medicine may be used to treat endometriosis, uterine fibroids, or other male hormone-related problems. This medicine may be used for other purposes; ask your health care provider or pharmacist if you have questions. COMMON BRAND NAME(S): Eligard, Lupron Depot, Lupron Depot-Ped, Viadur What should I tell my health care provider before I take this medicine? They need to know if you have any of these conditions: -diabetes -heart disease or previous heart attack -high blood pressure -high cholesterol -osteoporosis -pain or difficulty passing urine -spinal cord metastasis -stroke -tobacco smoker -unusual vaginal bleeding (women) -an unusual or allergic reaction to leuprolide, benzyl alcohol, other medicines, foods, dyes, or preservatives -pregnant or trying to get pregnant -breast-feeding How should I use this medicine? This medicine is for injection into a muscle or for implant or injection under the skin. It is given by a health care professional in a hospital or clinic setting. The specific product will determine how it will be given to you. Make sure you understand which product you receive and how often you will receive it. Talk to your pediatrician regarding the use of this medicine in children. Special care may be needed. Overdosage: If you think you have taken too much of this medicine contact a poison control center or emergency room at once. NOTE: This medicine is only for you. Do not share this medicine with others. What if I miss a dose? It is important not to miss a dose. Call your doctor or health care professional if you are unable to keep an appointment. Depot  injections: Depot injections are given either once-monthly, every 12 weeks, every 16 weeks, or every 24 weeks depending on the product you are prescribed. The product you are prescribed will be based on if you are male or male, and your condition. Make sure you understand your product and dosing. Implant dosing: The implant is removed and replaced once a year. The implant is only used in males. What may interact with this medicine? Do not take this medicine with any of the following medications: -chasteberry This medicine may also interact with the following medications: -herbal or dietary supplements, like black cohosh or DHEA -male hormones, like estrogens or progestins and birth control pills, patches, rings, or injections -male hormones, like testosterone This list may not describe all possible interactions. Give your health care provider a list of all the medicines, herbs, non-prescription drugs, or dietary supplements you use. Also tell them if you smoke, drink alcohol, or use illegal drugs. Some items may interact with your medicine. What should I watch for while using this medicine? Visit your doctor or health care professional for regular checks on your progress. During the first weeks of treatment, your symptoms may get worse, but then will improve as you continue your treatment. You may get hot flashes, increased bone pain, increased difficulty passing urine, or an aggravation of nerve symptoms. Discuss these effects with your doctor or health care professional, some of them may improve with continued use of this medicine. Male patients may experience a menstrual cycle or spotting during the first months of therapy with this medicine. If this continues, contact your doctor or health care professional. What side effects may I notice from   receiving this medicine? Side effects that you should report to your doctor or health care professional as soon as possible: -allergic reactions like  skin rash, itching or hives, swelling of the face, lips, or tongue -breathing problems -chest pain -depression or memory disorders -pain in your legs or groin -pain at site where injected or implanted -severe headache -swelling of the feet and legs -visual changes -vomiting Side effects that usually do not require medical attention (report to your doctor or health care professional if they continue or are bothersome): -breast swelling or tenderness -decrease in sex drive or performance -diarrhea -hot flashes -loss of appetite -muscle, joint, or bone pains -nausea -redness or irritation at site where injected or implanted -skin problems or acne This list may not describe all possible side effects. Call your doctor for medical advice about side effects. You may report side effects to FDA at 1-800-FDA-1088. Where should I keep my medicine? This drug is given in a hospital or clinic and will not be stored at home. NOTE: This sheet is a summary. It may not cover all possible information. If you have questions about this medicine, talk to your doctor, pharmacist, or health care provider.  2015, [employer]. (2010-04-06 14:41:21)  

## 2015-04-10 NOTE — Progress Notes (Signed)
Hematology and Oncology Follow Up Visit  Steven Church [id number] 10/24/1950 64 y.o. 04/10/2015 11:33 AM Glade Lloyd, Redge Gainer, PA-CTysinger, Camelia Eng, PA-C   Principle Diagnosis: 64 year old gentleman with prostate cancer presented with diffuse bony metastasis and lymphadenopathy. His PSA is 899 with unknown Gleason score. His diagnosis was confirmed in June 2016.   Prior Therapy: He is status post a biopsy obtained of the soft tissue in the retroperitoneal area on 04/03/2015.  Current therapy: Under consideration to start an urgent deprivation.  Interim History:  Steven Church presents today for a follow-up visit. He is a pleasant 64 year old gentleman I'm seen for the first time after he has been evaluated for potentially lung cancer. His workup included a PET CT scan on 03/20/2015 which showed diffuse lymphadenopathy, diffuse bony metastasis and potentially prostate involvement. His PSA is elevated suggesting prostate cancer. Soft tissue biopsy done on 04/03/2015 confirmed that diagnosis. Clinically, he remains relatively asymptomatic. He does report mild lower urinary tract symptoms including nocturia and incomplete emptying. But does not report any pain. He does not report any back pain shoulder pain or neuropathy. He has been prescribed hydrocodone which she does not take. He continues to work full-time and lives independently. He has not reported any decline in his performance status or activity level.  He is not report any headaches, blurry vision, double vision, syncope or seizures. He does not report any fevers, chills, sweats, weight loss or appetite changes. He does not report any chest pain, palpitation, orthopnea or leg edema. He does not report any cough, shortness of breath, dyspnea on exertion, wheezing or hemoptysis. He does not report any nausea, vomiting, abdominal pain, hematochezia, melanoma, constipation, diarrhea or loose satiety. He does not report any hematuria or  dysuria. He does report frequency, hesitancy and nocturia. He does not report any skeletal complaints of arthralgias or myalgias. He does not report any heat or cold intolerance. He does not report any lymphadenopathy or petechiae. Remaining review of systems unremarkable.  Medications: I have reviewed the patient's current medications.  Current Outpatient Prescriptions  Medication Sig Dispense Refill  . Aspirin-Caffeine (BC FAST PAIN RELIEF PO) Take 1 Package by mouth as needed (pain).    Marland Kitchen EDARBYCLOR 40-25 MG TABS TAKE 1 TABLET BY MOUTH EVERY MORNING. 30 tablet 1  . finasteride (PROSCAR) 5 MG tablet Take 1 tablet (5 mg total) by mouth daily. 30 tablet 11  . fluticasone (FLONASE) 50 MCG/ACT nasal spray Place 2 sprays into both nostrils daily.     Marland Kitchen HYDROcodone-acetaminophen (NORCO/VICODIN) 5-325 MG per tablet Take 1 tablet by mouth every 6 (six) hours as needed for moderate pain or severe pain.   0  . omeprazole (PRILOSEC) 40 MG capsule TAKE 1 CAPSULE (40 MG TOTAL) BY MOUTH DAILY. 30 capsule 5  . tamsulosin (FLOMAX) 0.4 MG CAPS capsule TAKE ONE CAPSULE BY MOUTH EVERY DAY (Patient taking differently: TAKE 0.4 MG BY MOUTH EVERY DAY) 30 capsule 2  . bicalutamide (CASODEX) 50 MG tablet Take 1 tablet (50 mg total) by mouth daily. 30 tablet 0   No current facility-administered medications for this visit.     Allergies: No Known Allergies  Past Medical History, Surgical history, Social history, and Family History were reviewed and updated.   Physical Exam: Blood pressure 125/72, pulse 66, temperature 97.8 F (36.6 C), temperature source Oral, resp. rate 18, height 5\' 10"  (1.778 m), weight 221 lb 11.2 oz (100.562 kg). ECOG: 0 General appearance: alert and cooperative Head: Normocephalic, without obvious abnormality Neck:  no adenopathy Lymph nodes: Cervical, supraclavicular, and axillary nodes normal. Heart:regular rate and rhythm, S1, S2 normal, no murmur, click, rub or gallop Lung:chest  clear, no wheezing, rales, normal symmetric air entry Abdomin: soft, non-tender, without masses or organomegaly EXT:no erythema, induration, or nodules   Lab Results: Lab Results  Component Value Date   WBC 7.4 04/03/2015   HGB 11.6* 04/03/2015   HCT 35.4* 04/03/2015   MCV 85.5 04/03/2015   PLT 268 04/03/2015     Chemistry      Component Value Date/Time   NA 140 03/20/2015 1122   NA 139 02/27/2015 0001   K 3.4* 03/20/2015 1122   K 3.8 02/27/2015 0001   CL 104 02/27/2015 0001   CO2 24 03/20/2015 1122   CO2 25 02/27/2015 0001   BUN 31.1* 03/20/2015 1122   BUN 29* 02/27/2015 0001   CREATININE 1.4* 03/20/2015 1122   CREATININE 1.26 02/27/2015 0001   CREATININE 1.10 09/30/2011 1453      Component Value Date/Time   CALCIUM 9.2 03/20/2015 1122   CALCIUM 9.6 02/27/2015 0001   ALKPHOS 250* 03/20/2015 1122   AST 29 03/20/2015 1122   ALT 34 03/20/2015 1122   BILITOT 0.66 03/20/2015 1122     Results for CARNEL, STEGMAN (MRN [id number]) as of 04/10/2015 11:34  Ref. Range 02/27/2015 00:01  PSA Latest Ref Range: <=4.00 ng/mL 899.80 (H)    Radiological Studies:  EXAM: NUCLEAR MEDICINE PET SKULL BASE TO THIGH  TECHNIQUE: 11.2 mCi F-18 FDG was injected intravenously. Full-ring PET imaging was performed from the skull base to thigh after the radiotracer. CT data was obtained and used for attenuation correction and anatomic localization.  FASTING BLOOD GLUCOSE: Value: 100 mg/dl  COMPARISON: Chest CT 02/27/2015  FINDINGS: NECK  No hypermetabolic lymph nodes in the neck.  CHEST  As demonstrated on the recent CT scan there is mediastinal lymphadenopathy which is hypermetabolic.  The pretracheal nodal disease has an SUV max of 3.4.  The aorticopulmonary window adenopathy is 5.9.  The left paraesophageal nodal disease is 7.4.  No definite pulmonary nodules or primary lung neoplasm. Mild emphysematous changes. No pleural  effusion.  ABDOMEN/PELVIS  Abnormal FDG activity noted in the mid central portion of the prostate gland with SUV max of 10.2. This is worrisome for primary prostate carcinoma. It is not in the peripheral zone as typically seen.  There is adjacent perirectal lymphadenopathy, pelvic sidewall adenopathy, sigmoid mesocolon adenopathy and bulky retroperitoneal adenopathy, right greater than left. This ends just above the iliac crest.  The largest nodal mass on the right side at the level of the aortic bifurcation measures 6.0 x 5.2 cm and has an SUV max of 10.0.  3 adjacent sigmoid mesocolon lymph nodes measure 10.5, 20 and 23 mm. SUV max is 9.1.  No inguinal lymphadenopathy.  SKELETON  Diffuse hypermetabolic skeletal metastasis involving the spine, ribs, sternum and pelvis. No pathologic fractures are identified.  Index lesion in the left twelfth rib has an SUV max of 9.67.  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 There is bulky pelvic lymphadenopathy, mediastinal lymphadenopathy and diffuse osseous metastatic disease.    Impression and Plan:  64 year old gentleman with the following issues:  1. Advanced prostate cancer presenting with diffuse bony metastasis and bulky lymphadenopathy and PSA of 899.8. This is  biopsy proven to be prostate cancer. The natural course of this disease was discussed with the patient extensively. Treatment options were outlined and I emphasized the fact that these are palliative in nature. In vision depravation remains the first line of therapy at this time. Explained to him that that could be done surgically by bilateral orchiectomy versus chemical castration in the form of [place] agonist or antagonist.  Risks and benefits of and during deprivation were  discussed. Complications include fatigue, tiredness, weight gain, hot flashes, osteoporosis and potential cardiovascular complications were reviewed. Benefit would include a reduction in his cancer volume and improvement in his quality of life. Overall survival is definitely improved.  Given the potential flare phenomenon associated with LHRH agonists I will add Casodex which will be started today.  The role of adding systemic chemotherapy was also reviewed. There is certainly clinical benefit associated with adding chemotherapy for hormone sensitive prostate cancer based on at least 2-3 studies. Adding he would benefit and he has excellent performance status to withstand it.  The details of systemic chemotherapy would include Taxotere at 75 mg/m given every 3 weeks for total of 6 cycles. This has been associated with improvement in overall survival based on the CHAARTED of close to 13 months. He might consider it but he is a bit overwhelmed at this time and I prefer to proceed with any deprivation only and consider systemic chemotherapy at a later date. He is a risk of developing castration resistant disease and chemotherapies in the future regardless.  2. Bone directed therapy: He will benefit from [facility] and in preparation for that I will ask him to obtain dental clearance as well as start calcium supplements. We will start in future visits.  3. Lower urinary tract symptoms: Seems to be improved with Proscar.   4. Pain: Seems to be reasonably controlled with mild analgesia. Radiation therapy could be used in the future if needed to for palliative purposes.  5. Prognosis: This was discussed today in detail. He understands that he has an incurable malignancy but certainly treatable. I fear that he has an aggressive cancer that can develop castration resistant disease. He does not have good relationship with his family including his sister and his son. He continues to live independently which could be  an issue down the line I will continue to address that with him.  All his questions were answered today to his satisfaction.   [facility], MD 6/24/201611:33 AM

## 2015-04-24 ENCOUNTER — Ambulatory Visit (INDEPENDENT_AMBULATORY_CARE_PROVIDER_SITE_OTHER): Payer: 59 | Admitting: Medical

## 2015-04-24 ENCOUNTER — Encounter: Payer: Self-pay | Admitting: Medical

## 2015-04-24 VITALS — BP 100/68 | HR 81 | Temp 98.1°F | Resp 14 | Ht 70.0 in | Wt 221.0 lb

## 2015-04-24 DIAGNOSIS — Z639 Problem related to primary support group, unspecified: Secondary | ICD-10-CM

## 2015-04-24 DIAGNOSIS — R599 Enlarged lymph nodes, unspecified: Secondary | ICD-10-CM | POA: Diagnosis not present

## 2015-04-24 DIAGNOSIS — J3089 Other allergic rhinitis: Secondary | ICD-10-CM | POA: Diagnosis not present

## 2015-04-24 DIAGNOSIS — I10 Essential (primary) hypertension: Secondary | ICD-10-CM | POA: Diagnosis not present

## 2015-04-24 DIAGNOSIS — Z638 Other specified problems related to primary support group: Secondary | ICD-10-CM

## 2015-04-24 DIAGNOSIS — R590 Localized enlarged lymph nodes: Secondary | ICD-10-CM | POA: Diagnosis not present

## 2015-04-24 DIAGNOSIS — Z0000 Encounter for general adult medical examination without abnormal findings: Secondary | ICD-10-CM

## 2015-04-24 DIAGNOSIS — C61 Malignant neoplasm of prostate: Secondary | ICD-10-CM

## 2015-04-24 LAB — POCT URINALYSIS DIPSTICK
BILIRUBIN UA: NEGATIVE
Glucose, UA: NEGATIVE
KETONES UA: NEGATIVE
Leukocytes, UA: NEGATIVE
NITRITE UA: NEGATIVE
PROTEIN UA: NEGATIVE
SPEC GRAV UA: 1.025
UROBILINOGEN UA: NEGATIVE
pH, UA: 6

## 2015-04-24 MED ORDER — TAMSULOSIN HCL 0.4 MG PO CAPS: 0.4000 mg | ORAL_CAPSULE | Freq: Every day | ORAL | Status: DC

## 2015-04-24 MED ORDER — FINASTERIDE 5 MG PO TABS: 5.0000 mg | ORAL_TABLET | Freq: Every day | ORAL | Status: DC

## 2015-04-24 MED ORDER — OMEPRAZOLE 40 MG PO CPDR: 40.0000 mg | DELAYED_RELEASE_CAPSULE | Freq: Every day | ORAL | Status: DC

## 2015-04-24 MED ORDER — AZILSARTAN-CHLORTHALIDONE 40-25 MG PO TABS: 1.0000 | ORAL_TABLET | Freq: Every morning | ORAL | Status: DC

## 2015-04-24 NOTE — Progress Notes (Signed)
Subjective:   HPI  Steven Church is a 64 y.o. male who presents for a complete physical.  Medical care team includes:  Dr. Zola Button, oncology Darneshia Demary Audelia Acton, PA-C here for primary care   Preventative care: Last ophthalmology visit:N/A Last dental visit: YES UNSURE OF DENTIST NAME Last colonoscopy:UNSURE Last prostate exam: recently Last EKG:N/A Last labs:recently  Prior vaccinations: TD or Tdap:WITH IN 5 YEARS HE GOT AT [place] Pneumococcal:N/A Shingles/Zostavax:N/A  Advanced directive: Health care power of attorney: No Living will: No  Concerns: Recent treatemnt initiated for prostate cancer  Reviewed their medical, surgical, family, social, medication, and allergy history and updated chart as appropriate.  Past Medical History  Diagnosis Date  . Hypertension   . Prostate cancer 04/07/2015    Past Surgical History  Procedure Laterality Date  . Colonoscopy      never    History   Social History  . Marital Status: Divorced    Spouse Name: N/A  . Number of Children: N/A  . Years of Education: N/A   Occupational History  . Not on file.   Social History Main Topics  . Smoking status: Former Smoker -- 1.00 packs/day for 18 years  . Smokeless tobacco: Not on file  . Alcohol Use: No  . Drug Use: No  . Sexual Activity: Yes   Other Topics Concern  . 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  Exercise with walking    Family History  Problem Relation Age of Onset  . Hypertension Mother   . Diabetes Father      Current outpatient prescriptions:  .  bicalutamide (CASODEX) 50 MG tablet, Take 1 tablet (50 mg total) by mouth daily., Disp: 30 tablet, Rfl: 0 .  EDARBYCLOR 40-25 MG TABS, TAKE 1 TABLET BY MOUTH EVERY MORNING., Disp: 30 tablet, Rfl: 1 .  finasteride (PROSCAR) 5 MG tablet, Take 1 tablet (5 mg total) by  mouth daily., Disp: 30 tablet, Rfl: 11 .  fluticasone (FLONASE) 50 MCG/ACT nasal spray, Place 2 sprays into both nostrils daily. , Disp: , Rfl:  .  omeprazole (PRILOSEC) 40 MG capsule, TAKE 1 CAPSULE (40 MG TOTAL) BY MOUTH DAILY., Disp: 30 capsule, Rfl: 5 .  tamsulosin (FLOMAX) 0.4 MG CAPS capsule, TAKE ONE CAPSULE BY MOUTH EVERY DAY (Patient taking differently: TAKE 0.4 MG BY MOUTH EVERY DAY), Disp: 30 capsule, Rfl: 2 .  Aspirin-Caffeine (BC FAST PAIN RELIEF PO), Take 1 Package by mouth as needed (pain)., Disp: , Rfl:  .  HYDROcodone-acetaminophen (NORCO/VICODIN) 5-325 MG per tablet, Take 1 tablet by mouth every 6 (six) hours as needed for moderate pain or severe pain. , Disp: , Rfl: 0  No Known Allergies   Review of Systems Constitutional: -fever, -chills, -sweats, -unexpected weight change, -decreased appetite, -fatigue Allergy: -sneezing, -itching, -congestion Dermatology: -changing moles, --rash, -lumps ENT: -runny nose, -ear pain, -sore throat, -hoarseness, -sinus pain, -teeth pain, - ringing in ears, -hearing loss, -nosebleeds Cardiology: -chest pain, -palpitations, -swelling, -difficulty breathing when lying flat, -waking up short of breath Respiratory: +cough, -shortness of breath, -difficulty breathing with exercise or exertion, -wheezing, -coughing up blood Gastroenterology: -abdominal pain, -nausea, -vomiting, -diarrhea, -constipation, -blood in stool, -changes in bowel movement, -difficulty swallowing or eating Hematology: -bleeding, -bruising  Musculoskeletal: -joint aches, -muscle aches, -joint swelling, -back pain, -neck pain, -cramping, -changes in gait Ophthalmology: denies vision changes, eye redness, itching, discharge Urology: -burning  with urination, -difficulty urinating, -blood in urine, -urinary frequency, -urgency, -incontinence Neurology: -headache, -weakness, -tingling, -numbness, -memory loss, -falls, -dizziness Psychology: -depressed mood, -agitation, -sleep  problems     Objective:   Physical Exam  BP 100/68 mmHg  Pulse 81  Temp(Src) 98.1 F (36.7 C) (Oral)  Resp 14  Ht 5\' 10"  (1.778 m)  Wt 221 lb (100.245 kg)  BMI 31.71 kg/m2  General appearance: alert, no distress, WD/WN, obese AA male HEENT: normocephalic, sclerae anicteric, TMs pearly, nares normal, no discharge or erythema, pharynx normal Oral cavity: MMM, no lesions Neck: supple, no lymphadenopathy, no thyromegaly, no masses, no bruits Heart: RRR, normal S1, S2, no murmurs Lungs: CTA bilaterally, no wheezes, rhonchi, or rales Abdomen: +bs, soft, non tender, non distended, no masses, no hepatomegaly, no splenomegaly Back: nontender, no deformity MSK: UE and LE  unremarkable Pulses: 2+ symmetric, upper and lower extremities, normal cap refill Ext: no edema Neuro: nonfocal exam Skin: few scattered benign macules GU: normal male, uncircumcised, no mas, no nodules, no hernia Rectal - deferred, just examined last visit    Adult ECG Report  Indication: HTN  Rate: 61bpm  Rhythm: normal sinus rhythm  QRS Axis: -7 degrees  PR Interval: 175ms  QRS Duration: 17ms  QTc: 460ms  Conduction Disturbances: poor R wave progression, T wave inversion V6  Other Abnormalities: none  Patient's cardiac risk factors are: advanced age (older than 49 for men, 49 for women), hypertension, male gender and obesity (BMI >= 30 kg/m2).  EKG comparison: none  Narrative Interpretation: nonspecific intraventricular conduction delay, poor R wave progression     Assessment and Plan :    Encounter Diagnoses  Name Primary?  . Encounter for health maintenance examination in adult Yes  . Essential hypertension   . Prostate cancer   . Mediastinal lymphadenopathy   . Retroperitoneal lymphadenopathy   . Other allergic rhinitis   . Family discord     Physical exam - discussed healthy lifestyle, diet, exercise, preventative care, vaccinations, and addressed their concerns.   See your eye doctor  yearly for routine vision care See your dentist yearly for routine dental care including hygiene visits twice yearly.  HTN - c/t same medication, reviewed recent labs  Prostate cancer, lymphadenopathy - just recently started on therapy per oncology.   reviewed recent oncology notes, results from imaging and labs including PET scan.  Discussed focusing on his goals in life, priorities.  Advised he start going to church with his sister who he is closest too.  Glad to hear he has had some prayer time with 2 different ministers that have been out to the home.  Advised he pursue counseling through oncology office/ support group.    Family discord - in light of his diagnosis, advised he work on mending relationships with siblings, son, asking for forgiveness and extending forgiveness, repairing any relationship problems.     I will touch base with oncology on vaccines, verifying no contraindications.  He is technically due for flu vaccine, pneumococcal, possibly shingles, says he is up to date on tdap in last 5 years.    He has never had colonoscopy but this can be put on hold for now as he initiates therapy for prostate cancer.    Follow-up pending call back

## 2015-05-04 ENCOUNTER — Telehealth: Payer: Self-pay | Admitting: Medical

## 2015-05-04 NOTE — Telephone Encounter (Signed)
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  Have him come back at his convenience for pneumococcal vaccine, Prevnar 13 first if he hasn't had it, and if then he would be due for PPSV 23 next year.   Have him come back in August/September for flu shot.  We will NOT do shingles vaccine.

## 2015-05-05 NOTE — Telephone Encounter (Signed)
Patient is aware and he is coming by on Friday for his Prevnar 13 vaccine.

## 2015-05-08 ENCOUNTER — Other Ambulatory Visit (INDEPENDENT_AMBULATORY_CARE_PROVIDER_SITE_OTHER): Payer: 59

## 2015-05-08 ENCOUNTER — Other Ambulatory Visit: Payer: Self-pay | Admitting: Oncology

## 2015-05-08 DIAGNOSIS — Z23 Encounter for immunization: Secondary | ICD-10-CM

## 2015-06-02 ENCOUNTER — Other Ambulatory Visit: Payer: Self-pay | Admitting: Oncology

## 2015-06-05 ENCOUNTER — Telehealth: Payer: Self-pay | Admitting: Oncology

## 2015-06-05 ENCOUNTER — Ambulatory Visit (HOSPITAL_BASED_OUTPATIENT_CLINIC_OR_DEPARTMENT_OTHER): Payer: 59 | Admitting: Oncology

## 2015-06-05 ENCOUNTER — Other Ambulatory Visit (HOSPITAL_BASED_OUTPATIENT_CLINIC_OR_DEPARTMENT_OTHER): Payer: 59

## 2015-06-05 VITALS — BP 134/87 | HR 62 | Temp 98.1°F | Resp 18 | Ht 70.0 in | Wt 230.8 lb

## 2015-06-05 DIAGNOSIS — C786 Secondary malignant neoplasm of retroperitoneum and peritoneum: Secondary | ICD-10-CM | POA: Diagnosis not present

## 2015-06-05 DIAGNOSIS — C61 Malignant neoplasm of prostate: Secondary | ICD-10-CM

## 2015-06-05 DIAGNOSIS — C7951 Secondary malignant neoplasm of bone: Secondary | ICD-10-CM

## 2015-06-05 DIAGNOSIS — R351 Nocturia: Secondary | ICD-10-CM

## 2015-06-05 DIAGNOSIS — R339 Retention of urine, unspecified: Secondary | ICD-10-CM

## 2015-06-05 DIAGNOSIS — R590 Localized enlarged lymph nodes: Secondary | ICD-10-CM

## 2015-06-05 LAB — CBC WITH DIFFERENTIAL/PLATELET
BASO%: 0.6 % (ref 0.0–2.0)
BASOS ABS: 0 10*3/uL (ref 0.0–0.1)
EOS ABS: 0.4 10*3/uL (ref 0.0–0.5)
EOS%: 5.8 % (ref 0.0–7.0)
HEMATOCRIT: 36.1 % — AB (ref 38.4–49.9)
HGB: 12 g/dL — ABNORMAL LOW (ref 13.0–17.1)
LYMPH#: 2.5 10*3/uL (ref 0.9–3.3)
LYMPH%: 33.2 % (ref 14.0–49.0)
MCH: 28.7 pg (ref 27.2–33.4)
MCHC: 33.3 g/dL (ref 32.0–36.0)
MCV: 86.1 fL (ref 79.3–98.0)
MONO#: 0.6 10*3/uL (ref 0.1–0.9)
MONO%: 7.8 % (ref 0.0–14.0)
NEUT#: 3.9 10*3/uL (ref 1.5–6.5)
NEUT%: 52.6 % (ref 39.0–75.0)
Platelets: 189 10*3/uL (ref 140–400)
RBC: 4.19 10*6/uL — ABNORMAL LOW (ref 4.20–5.82)
RDW: 15.1 % — AB (ref 11.0–14.6)
WBC: 7.4 10*3/uL (ref 4.0–10.3)

## 2015-06-05 LAB — COMPREHENSIVE METABOLIC PANEL (CC13)
ALK PHOS: 809 U/L — AB (ref 40–150)
ALT: 25 U/L (ref 0–55)
AST: 20 U/L (ref 5–34)
Albumin: 3.5 g/dL (ref 3.5–5.0)
Anion Gap: 11 mEq/L (ref 3–11)
BUN: 26 mg/dL (ref 7.0–26.0)
CALCIUM: 9.4 mg/dL (ref 8.4–10.4)
CO2: 22 mEq/L (ref 22–29)
Chloride: 107 mEq/L (ref 98–109)
Creatinine: 1.2 mg/dL (ref 0.7–1.3)
EGFR: 71 mL/min/{1.73_m2} — ABNORMAL LOW (ref >90)
Glucose: 92 mg/dl (ref 70–140)
POTASSIUM: 3.3 meq/L — AB (ref 3.5–5.1)
Sodium: 140 mEq/L (ref 136–145)
Total Bilirubin: 0.47 mg/dL (ref 0.20–1.20)
Total Protein: 7.4 g/dL (ref 6.4–8.3)

## 2015-06-05 MED ORDER — CALCIUM CITRATE-VITAMIN D 250-100 MG-UNIT PO TABS: 1.0000 | ORAL_TABLET | Freq: Two times a day (BID) | ORAL | Status: DC

## 2015-06-05 NOTE — Telephone Encounter (Signed)
Gave and printed appt sched and avs fo rpt for OCT °

## 2015-06-05 NOTE — Progress Notes (Signed)
Hematology and Oncology Follow Up Visit  Steven Church [id number] 1951-10-04 64 y.o. 06/05/2015 3:20 PM [name], PA-CTysinger, Camelia Eng, PA-C   Principle Diagnosis: 64 year old gentleman with prostate cancer presented with diffuse bony metastasis and lymphadenopathy. His PSA is 899 with unknown Gleason score. His diagnosis was confirmed in June 2016.   Prior Therapy: He is status post a biopsy obtained of the soft tissue in the retroperitoneal area on 04/03/2015.  Current therapy: Lupron 22.5 mg IM every 3 months started in June 2016. Casodex given for 1 month and discontinued in August 2016. Xgeva to start and October 2016.  Interim History:  Steven Church presents today for a follow-up visit. Since the last visit, he received a Lupron without any complications. He finished overall 1 month of Casodex and have tolerated it well. He did report some hot flashes and some gait gain but otherwise no complications. He does report mild lower urinary tract symptoms including nocturia and incomplete emptying. But does not report any pain. He does not report any back pain shoulder pain or neuropathy. He has been prescribed hydrocodone which she does not take. He continues to work full-time and lives independently. He has not reported any decline in his performance status or activity level.  He is not report any headaches, blurry vision, double vision, syncope or seizures. He does not report any fevers, chills, sweats, weight loss or appetite changes. He does not report any chest pain, palpitation, orthopnea or leg edema. He does not report any cough, shortness of breath, dyspnea on exertion, wheezing or hemoptysis. He does not report any nausea, vomiting, abdominal pain, hematochezia, melanoma, constipation, diarrhea or loose satiety. He does not report any hematuria or dysuria. He does report frequency, hesitancy and nocturia. He does not report any skeletal complaints of arthralgias or  myalgias. He does not report any heat or cold intolerance. He does not report any lymphadenopathy or petechiae. Remaining review of systems unremarkable.  Medications: I have reviewed the patient's current medications.  Current Outpatient Prescriptions  Medication Sig Dispense Refill  . Aspirin-Caffeine (BC FAST PAIN RELIEF PO) Take 1 Package by mouth as needed (pain).    . Azilsartan-Chlorthalidone (EDARBYCLOR) 40-25 MG TABS Take 1 tablet by mouth every morning. 90 tablet 3  . calcium-vitamin D 250-100 MG-UNIT per tablet Take 1 tablet by mouth 2 (two) times daily. 60 tablet 3  . finasteride (PROSCAR) 5 MG tablet Take 1 tablet (5 mg total) by mouth daily. 30 tablet 0  . fluticasone (FLONASE) 50 MCG/ACT nasal spray Place 2 sprays into both nostrils daily.     Marland Kitchen omeprazole (PRILOSEC) 40 MG capsule Take 1 capsule (40 mg total) by mouth daily. 90 capsule 1  . tamsulosin (FLOMAX) 0.4 MG CAPS capsule Take 1 capsule (0.4 mg total) by mouth daily. 90 capsule 1   No current facility-administered medications for this visit.     Allergies: No Known Allergies  Past Medical History, Surgical history, Social history, and Family History were reviewed and updated.   Physical Exam: Blood pressure 134/87, pulse 62, temperature 98.1 F (36.7 C), temperature source Oral, resp. rate 18, height 5\' 10"  (1.778 m), weight 230 lb 12.8 oz (104.69 kg), SpO2 99 %. ECOG: 0 General appearance: alert and cooperative Head: Normocephalic, without obvious abnormality Neck: no adenopathy Lymph nodes: Cervical, supraclavicular, and axillary nodes normal. Heart:regular rate and rhythm, S1, S2 normal, no murmur, click, rub or gallop Lung:chest clear, no wheezing, rales, normal symmetric air entry Abdomin: soft, non-tender, without  masses or organomegaly EXT:no erythema, induration, or nodules   Lab Results: Lab Results  Component Value Date   WBC 7.4 06/05/2015   HGB 12.0* 06/05/2015   HCT 36.1* 06/05/2015   MCV  86.1 06/05/2015   PLT 189 06/05/2015     Chemistry      Component Value Date/Time   NA 140 03/20/2015 1122   NA 139 02/27/2015 0001   K 3.4* 03/20/2015 1122   K 3.8 02/27/2015 0001   CL 104 02/27/2015 0001   CO2 24 03/20/2015 1122   CO2 25 02/27/2015 0001   BUN 31.1* 03/20/2015 1122   BUN 29* 02/27/2015 0001   CREATININE 1.4* 03/20/2015 1122   CREATININE 1.26 02/27/2015 0001   CREATININE 1.10 09/30/2011 1453      Component Value Date/Time   CALCIUM 9.2 03/20/2015 1122   CALCIUM 9.6 02/27/2015 0001   ALKPHOS 250* 03/20/2015 1122   AST 29 03/20/2015 1122   ALT 34 03/20/2015 1122   BILITOT 0.66 03/20/2015 1122     Results for Steven Church, Steven Church (MRN [id number]) as of 04/10/2015 11:34  Ref. Range 02/27/2015 00:01  PSA Latest Ref Range: <=4.00 ng/mL 899.80 (H)    Impression and Plan:  64 year old gentleman with the following issues:  1. Advanced prostate cancer presenting with diffuse bony metastasis and bulky lymphadenopathy and PSA of 899.8. This is biopsy proven to be prostate cancer.   He is currently on Lupron started in June 2016 and have tolerated it well. Casodex was discontinued after the flare phenomenon have been avoided.  The role of systemic chemotherapy was discussed again and opted to defer to a later date.  2. Bone directed therapy: Risks and benefits of Delton See was discussed today. These complications include hypocalcemia, osteonecrosis of the jaw and injection-related problems. He is ready to proceed and I we will start him with calcium and vitamin D supplements and preparation to start in the future.  3. Lower urinary tract symptoms: Seems to be improved with Proscar.   4. Pain: Seems to be reasonably controlled with mild analgesia. Radiation therapy could be used in the future if needed to for palliative purposes.  5. Prognosis: This was discussed today in detail. He understands that he has an incurable malignancy but certainly treatable. I fear that he has  an aggressive cancer that can develop castration resistant disease. At that time chemotherapy will be used.     Zola Button, MD 8/19/20163:20 PM

## 2015-06-06 LAB — PSA: PSA: 89.21 ng/mL — AB (ref <=4.00)

## 2015-07-24 ENCOUNTER — Other Ambulatory Visit (HOSPITAL_BASED_OUTPATIENT_CLINIC_OR_DEPARTMENT_OTHER): Payer: 59

## 2015-07-24 ENCOUNTER — Ambulatory Visit (HOSPITAL_BASED_OUTPATIENT_CLINIC_OR_DEPARTMENT_OTHER): Payer: 59

## 2015-07-24 ENCOUNTER — Ambulatory Visit (HOSPITAL_BASED_OUTPATIENT_CLINIC_OR_DEPARTMENT_OTHER): Payer: 59 | Admitting: Oncology

## 2015-07-24 ENCOUNTER — Telehealth: Payer: Self-pay | Admitting: Oncology

## 2015-07-24 VITALS — BP 128/71 | HR 64 | Temp 98.1°F | Resp 20 | Ht 70.0 in | Wt 236.0 lb

## 2015-07-24 DIAGNOSIS — Z5111 Encounter for antineoplastic chemotherapy: Secondary | ICD-10-CM | POA: Diagnosis not present

## 2015-07-24 DIAGNOSIS — C7951 Secondary malignant neoplasm of bone: Secondary | ICD-10-CM

## 2015-07-24 DIAGNOSIS — C61 Malignant neoplasm of prostate: Secondary | ICD-10-CM

## 2015-07-24 LAB — COMPREHENSIVE METABOLIC PANEL (CC13)
ALT: 19 U/L (ref 0–55)
AST: 18 U/L (ref 5–34)
Albumin: 3.4 g/dL — ABNORMAL LOW (ref 3.5–5.0)
Alkaline Phosphatase: 612 U/L — ABNORMAL HIGH (ref 40–150)
Anion Gap: 8 mEq/L (ref 3–11)
BUN: 20.3 mg/dL (ref 7.0–26.0)
CHLORIDE: 109 meq/L (ref 98–109)
CO2: 25 meq/L (ref 22–29)
Calcium: 9.4 mg/dL (ref 8.4–10.4)
Creatinine: 1.3 mg/dL (ref 0.7–1.3)
EGFR: 68 mL/min/{1.73_m2} — AB (ref >90)
Glucose: 102 mg/dl (ref 70–140)
POTASSIUM: 3.6 meq/L (ref 3.5–5.1)
Sodium: 142 mEq/L (ref 136–145)
Total Bilirubin: 0.42 mg/dL (ref 0.20–1.20)
Total Protein: 7.3 g/dL (ref 6.4–8.3)

## 2015-07-24 LAB — CBC WITH DIFFERENTIAL/PLATELET
BASO%: 0.6 % (ref 0.0–2.0)
Basophils Absolute: 0 10*3/uL (ref 0.0–0.1)
EOS%: 3.6 % (ref 0.0–7.0)
Eosinophils Absolute: 0.2 10*3/uL (ref 0.0–0.5)
HEMATOCRIT: 37 % — AB (ref 38.4–49.9)
HEMOGLOBIN: 12.5 g/dL — AB (ref 13.0–17.1)
LYMPH#: 1.9 10*3/uL (ref 0.9–3.3)
LYMPH%: 32.5 % (ref 14.0–49.0)
MCH: 29.6 pg (ref 27.2–33.4)
MCHC: 33.7 g/dL (ref 32.0–36.0)
MCV: 87.7 fL (ref 79.3–98.0)
MONO#: 0.5 10*3/uL (ref 0.1–0.9)
MONO%: 7.9 % (ref 0.0–14.0)
NEUT#: 3.3 10*3/uL (ref 1.5–6.5)
NEUT%: 55.4 % (ref 39.0–75.0)
Platelets: 182 10*3/uL (ref 140–400)
RBC: 4.22 10*6/uL (ref 4.20–5.82)
RDW: 13.8 % (ref 11.0–14.6)
WBC: 5.9 10*3/uL (ref 4.0–10.3)

## 2015-07-24 MED ORDER — LEUPROLIDE ACETATE (3 MONTH) 22.5 MG IM KIT
22.5000 mg | PACK | Freq: Once | INTRAMUSCULAR | Status: AC
  Administered 2015-07-24: 22.5 mg via INTRAMUSCULAR
  Filled 2015-07-24: qty 22.5

## 2015-07-24 MED ORDER — DENOSUMAB 120 MG/1.7ML ~~LOC~~ SOLN
120.0000 mg | Freq: Once | SUBCUTANEOUS | Status: AC
  Administered 2015-07-24: 120 mg via SUBCUTANEOUS
  Filled 2015-07-24: qty 1.7

## 2015-07-24 NOTE — Patient Instructions (Signed)
Denosumab injection What is this medicine? DENOSUMAB (den oh sue mab) slows bone breakdown. Prolia is used to treat osteoporosis in women after menopause and in men. Xgeva is used to prevent bone fractures and other bone problems caused by cancer bone metastases. Xgeva is also used to treat giant cell tumor of the bone. This medicine may be used for other purposes; ask your health care provider or pharmacist if you have questions. What should I tell my health care provider before I take this medicine? They need to know if you have any of these conditions: -dental disease -eczema -infection or history of infections -kidney disease or on dialysis -low blood calcium or vitamin D -malabsorption syndrome -scheduled to have surgery or tooth extraction -taking medicine that contains denosumab -thyroid or parathyroid disease -an unusual reaction to denosumab, other medicines, foods, dyes, or preservatives -pregnant or trying to get pregnant -breast-feeding How should I use this medicine? This medicine is for injection under the skin. It is given by a health care professional in a hospital or clinic setting. If you are getting Prolia, a special MedGuide will be given to you by the pharmacist with each prescription and refill. Be sure to read this information carefully each time. For Prolia, talk to your pediatrician regarding the use of this medicine in children. Special care may be needed. For Xgeva, talk to your pediatrician regarding the use of this medicine in children. While this drug may be prescribed for children as young as 13 years for selected conditions, precautions do apply. Overdosage: If you think you have taken too much of this medicine contact a poison control center or emergency room at once. NOTE: This medicine is only for you. Do not share this medicine with others. What if I miss a dose? It is important not to miss your dose. Call your doctor or health care professional if you are  unable to keep an appointment. What may interact with this medicine? Do not take this medicine with any of the following medications: -other medicines containing denosumab This medicine may also interact with the following medications: -medicines that suppress the immune system -medicines that treat cancer -steroid medicines like prednisone or cortisone This list may not describe all possible interactions. Give your health care provider a list of all the medicines, herbs, non-prescription drugs, or dietary supplements you use. Also tell them if you smoke, drink alcohol, or use illegal drugs. Some items may interact with your medicine. What should I watch for while using this medicine? Visit your doctor or health care professional for regular checks on your progress. Your doctor or health care professional may order blood tests and other tests to see how you are doing. Call your doctor or health care professional if you get a cold or other infection while receiving this medicine. Do not treat yourself. This medicine may decrease your body's ability to fight infection. You should make sure you get enough calcium and vitamin D while you are taking this medicine, unless your doctor tells you not to. Discuss the foods you eat and the vitamins you take with your health care professional. See your dentist regularly. Brush and floss your teeth as directed. Before you have any dental work done, tell your dentist you are receiving this medicine. Do not become pregnant while taking this medicine or for 5 months after stopping it. Women should inform their doctor if they wish to become pregnant or think they might be pregnant. There is a potential for serious side effects   to an unborn child. Talk to your health care professional or pharmacist for more information. What side effects may I notice from receiving this medicine? Side effects that you should report to your doctor or health care professional as soon as  possible: -allergic reactions like skin rash, itching or hives, swelling of the face, lips, or tongue -breathing problems -chest pain -fast, irregular heartbeat -feeling faint or lightheaded, falls -fever, chills, or any other sign of infection -muscle spasms, tightening, or twitches -numbness or tingling -skin blisters or bumps, or is dry, peels, or red -slow healing or unexplained pain in the mouth or jaw -unusual bleeding or bruising Side effects that usually do not require medical attention (Report these to your doctor or health care professional if they continue or are bothersome.): -muscle pain -stomach upset, gas This list may not describe all possible side effects. Call your doctor for medical advice about side effects. You may report side effects to FDA at 1-800-FDA-1088. Where should I keep my medicine? This medicine is only given in a clinic, doctor's office, or other health care setting and will not be stored at home. NOTE: This sheet is a summary. It may not cover all possible information. If you have questions about this medicine, talk to your doctor, pharmacist, or health care provider.    2016, Elsevier/Gold Standard. (2012-04-02 12:37:47) Leuprolide depot injection What is this medicine? LEUPROLIDE (loo PROE lide) is a man-made protein that acts like a natural hormone in the body. It decreases testosterone in men and decreases estrogen in women. In men, this medicine is used to treat advanced prostate cancer. In women, some forms of this medicine may be used to treat endometriosis, uterine fibroids, or other male hormone-related problems. This medicine may be used for other purposes; ask your health care provider or pharmacist if you have questions. What should I tell my health care provider before I take this medicine? They need to know if you have any of these conditions: -diabetes -heart disease or previous heart attack -high blood pressure -high  cholesterol -osteoporosis -pain or difficulty passing urine -spinal cord metastasis -stroke -tobacco smoker -unusual vaginal bleeding (women) -an unusual or allergic reaction to leuprolide, benzyl alcohol, other medicines, foods, dyes, or preservatives -pregnant or trying to get pregnant -breast-feeding How should I use this medicine? This medicine is for injection into a muscle or for injection under the skin. It is given by a health care professional in a hospital or clinic setting. The specific product will determine how it will be given to you. Make sure you understand which product you receive and how often you will receive it. Talk to your pediatrician regarding the use of this medicine in children. Special care may be needed. Overdosage: If you think you have taken too much of this medicine contact a poison control center or emergency room at once. NOTE: This medicine is only for you. Do not share this medicine with others. What if I miss a dose? It is important not to miss a dose. Call your doctor or health care professional if you are unable to keep an appointment. Depot injections: Depot injections are given either once-monthly, every 12 weeks, every 16 weeks, or every 24 weeks depending on the product you are prescribed. The product you are prescribed will be based on if you are male or male, and your condition. Make sure you understand your product and dosing. What may interact with this medicine? Do not take this medicine with any of the following  medications: -chasteberry This medicine may also interact with the following medications: -herbal or dietary supplements, like black cohosh or DHEA -male hormones, like estrogens or progestins and birth control pills, patches, rings, or injections -male hormones, like testosterone This list may not describe all possible interactions. Give your health care provider a list of all the medicines, herbs, non-prescription drugs, or  dietary supplements you use. Also tell them if you smoke, drink alcohol, or use illegal drugs. Some items may interact with your medicine. What should I watch for while using this medicine? Visit your doctor or health care professional for regular checks on your progress. During the first weeks of treatment, your symptoms may get worse, but then will improve as you continue your treatment. You may get hot flashes, increased bone pain, increased difficulty passing urine, or an aggravation of nerve symptoms. Discuss these effects with your doctor or health care professional, some of them may improve with continued use of this medicine. Male patients may experience a menstrual cycle or spotting during the first months of therapy with this medicine. If this continues, contact your doctor or health care professional. What side effects may I notice from receiving this medicine? Side effects that you should report to your doctor or health care professional as soon as possible: -allergic reactions like skin rash, itching or hives, swelling of the face, lips, or tongue -breathing problems -chest pain -depression or memory disorders -pain in your legs or groin -pain at site where injected or implanted -severe headache -swelling of the feet and legs -visual changes -vomiting Side effects that usually do not require medical attention (report to your doctor or health care professional if they continue or are bothersome): -breast swelling or tenderness -decrease in sex drive or performance -diarrhea -hot flashes -loss of appetite -muscle, joint, or bone pains -nausea -redness or irritation at site where injected or implanted -skin problems or acne This list may not describe all possible side effects. Call your doctor for medical advice about side effects. You may report side effects to FDA at 1-800-FDA-1088. Where should I keep my medicine? This drug is given in a hospital or clinic and will not be  stored at home. NOTE: This sheet is a summary. It may not cover all possible information. If you have questions about this medicine, talk to your doctor, pharmacist, or health care provider.    2016, Elsevier/Gold Standard. (2014-06-27 14:16:23)

## 2015-07-24 NOTE — Telephone Encounter (Signed)
Gave and pritned appt sched and avs for pt for NOV and Jan 2017

## 2015-07-24 NOTE — Progress Notes (Signed)
Hematology and Oncology Follow Up Visit  Steven Church [id number] 04-08-1951 64 y.o. 07/24/2015 12:07 PM   Principle Diagnosis: 64 year old gentleman with prostate cancer presented with diffuse bony metastasis and lymphadenopathy. His PSA is 899 with unknown Gleason score. His diagnosis was confirmed in June 2016.   Prior Therapy: He is status post a biopsy obtained of the soft tissue in the retroperitoneal area on 04/03/2015.  Current therapy:  Lupron 22.5 mg IM every 3 months started in June 2016. Casodex given for 1 month and discontinued in August 2016. Xgeva to start on 07/24/2015.  Interim History:  Steven Church presents today for a follow-up visit. Since the last visit, he continued to do well. He received a Lupron without any complications. He did report some hot flashes which has been infrequent and well tolerated. His appetite is excellent and have gained weight.  He does not report any pain. He does not report any back pain shoulder pain or neuropathy.He continues to work full-time and lives independently. He has not reported any decline in his performance status or activity level. He is taking calcium and vitamin D supplements without any complications related to it.  He is not report any headaches, blurry vision, double vision, syncope or seizures. He does not report any fevers, chills, sweats, weight loss or appetite changes. He does not report any chest pain, palpitation, orthopnea or leg edema. He does not report any cough, shortness of breath, dyspnea on exertion, wheezing or hemoptysis. He does not report any nausea, vomiting, abdominal pain, hematochezia, melanoma, constipation, diarrhea or loose satiety. He does not report any hematuria or dysuria. He does report frequency, hesitancy and nocturia. He does not report any skeletal complaints of arthralgias or myalgias. He does not report any heat or cold intolerance. He does not report any lymphadenopathy or petechiae.  Remaining review of systems unremarkable.  Medications: I have reviewed the patient's current medications.  Current Outpatient Prescriptions  Medication Sig Dispense Refill  . Aspirin-Caffeine (BC FAST PAIN RELIEF PO) Take 1 Package by mouth as needed (pain).    . Azilsartan-Chlorthalidone (EDARBYCLOR) 40-25 MG TABS Take 1 tablet by mouth every morning. 90 tablet 3  . bicalutamide (CASODEX) 50 MG tablet Take 50 mg by mouth daily.  0  . calcium-vitamin D 250-100 MG-UNIT per tablet Take 1 tablet by mouth 2 (two) times daily. 60 tablet 3  . finasteride (PROSCAR) 5 MG tablet Take 1 tablet (5 mg total) by mouth daily. 30 tablet 0  . fluticasone (FLONASE) 50 MCG/ACT nasal spray Place 2 sprays into both nostrils daily.     Marland Kitchen omeprazole (PRILOSEC) 40 MG capsule Take 1 capsule (40 mg total) by mouth daily. 90 capsule 1  . tamsulosin (FLOMAX) 0.4 MG CAPS capsule Take 1 capsule (0.4 mg total) by mouth daily. 90 capsule 1   No current facility-administered medications for this visit.     Allergies: No Known Allergies  Past Medical History, Surgical history, Social history, and Family History were reviewed and updated.   Physical Exam: Blood pressure 128/71, pulse 64, temperature 98.1 F (36.7 C), temperature source Oral, resp. rate 20, height 5\' 10"  (1.778 m), weight 236 lb (107.049 kg), SpO2 97 %. ECOG: 0 General appearance: alert and cooperative well-appearing gentleman without distress. Head: Normocephalic, without obvious abnormality no oral ulcers or lesions. Neck: no adenopathy Lymph nodes: Cervical, supraclavicular, and axillary nodes normal. Heart:regular rate and rhythm, S1, S2 normal, no murmur, click, rub or gallop Lung:chest clear, no wheezing, rales, normal symmetric  air entry Abdomin: soft, non-tender, without masses or organomegaly EXT:no erythema, induration, or nodules   Lab Results: Lab Results  Component Value Date   WBC 5.9 07/24/2015   HGB 12.5* 07/24/2015   HCT  37.0* 07/24/2015   MCV 87.7 07/24/2015   PLT 182 07/24/2015     Chemistry      Component Value Date/Time   NA 140 06/05/2015 1453   NA 139 02/27/2015 0001   K 3.3* 06/05/2015 1453   K 3.8 02/27/2015 0001   CL 104 02/27/2015 0001   CO2 22 06/05/2015 1453   CO2 25 02/27/2015 0001   BUN 26.0 06/05/2015 1453   BUN 29* 02/27/2015 0001   CREATININE 1.2 06/05/2015 1453   CREATININE 1.26 02/27/2015 0001   CREATININE 1.10 09/30/2011 1453      Component Value Date/Time   CALCIUM 9.4 06/05/2015 1453   CALCIUM 9.6 02/27/2015 0001   ALKPHOS 809* 06/05/2015 1453   AST 20 06/05/2015 1453   ALT 25 06/05/2015 1453   BILITOT 0.47 06/05/2015 1453     Results for Steven Church, Steven Church (MRN [id number]) as of 07/24/2015 11:51  Ref. Range 02/27/2015 00:01 06/05/2015 14:53  PSA Latest Ref Range: <=4.00 ng/mL 899.80 (H) 89.21 (H)     Impression and Plan:  64 year old gentleman with the following issues:  1. Advanced prostate cancer presenting with diffuse bony metastasis and bulky lymphadenopathy and PSA of 899.8. This is biopsy proven to be prostate cancer.   He is currently on Lupron started in June 2016 and have tolerated it well. Casodex was discontinued after the flare phenomenon have been avoided.  His PSA have cut down dramatically from 899-89. The plan is to continue with Lupron at this time as a single agent. The role of systemic chemotherapy was discussed again and opted to defer to a later date. He is at high risk of developing castration resistant disease and different therapy will be added at bedtime.  2. Bone directed therapy: Risks and benefits of Delton See was discussed again. These complications include hypocalcemia, osteonecrosis of the jaw and injection-related problems. He is ready to proceed today without any delay. He is already on calcium and vitamin D supplement.  3. Lower urinary tract symptoms: Seems to be improved with Proscar.   4. Pain: Seems to be reasonably controlled  with mild analgesia. Radiation therapy could be used in the future if needed to for palliative purposes.  5. Prognosis: This was discussed today in detail. He understands that he has an incurable malignancy but certainly treatable. He has good performance status and wants to be aggressive in his approach at the time being.  6. Follow-up: Will be in 3 months for clinical visit. He will receive Xgeva every 6 weeks and repeat Lupron in 3 months.     Zola Button, MD 10/7/201612:07 PM

## 2015-07-27 LAB — PSA: PSA: 126.6 ng/mL — AB (ref <=4.00)

## 2015-08-06 ENCOUNTER — Other Ambulatory Visit: Payer: Self-pay | Admitting: Medical

## 2015-08-06 NOTE — Telephone Encounter (Signed)
Is this ok to refill?  

## 2015-09-04 ENCOUNTER — Ambulatory Visit (HOSPITAL_BASED_OUTPATIENT_CLINIC_OR_DEPARTMENT_OTHER): Payer: 59

## 2015-09-04 ENCOUNTER — Other Ambulatory Visit (HOSPITAL_BASED_OUTPATIENT_CLINIC_OR_DEPARTMENT_OTHER): Payer: 59

## 2015-09-04 VITALS — BP 125/87 | HR 79 | Temp 98.2°F

## 2015-09-04 DIAGNOSIS — C61 Malignant neoplasm of prostate: Secondary | ICD-10-CM

## 2015-09-04 DIAGNOSIS — C7951 Secondary malignant neoplasm of bone: Secondary | ICD-10-CM | POA: Diagnosis not present

## 2015-09-04 LAB — CBC WITH DIFFERENTIAL/PLATELET
BASO%: 0.5 % (ref 0.0–2.0)
BASOS ABS: 0 10*3/uL (ref 0.0–0.1)
EOS ABS: 0.3 10*3/uL (ref 0.0–0.5)
EOS%: 3.5 % (ref 0.0–7.0)
HEMATOCRIT: 37.2 % — AB (ref 38.4–49.9)
HGB: 12.4 g/dL — ABNORMAL LOW (ref 13.0–17.1)
LYMPH#: 2.6 10*3/uL (ref 0.9–3.3)
LYMPH%: 32.4 % (ref 14.0–49.0)
MCH: 29 pg (ref 27.2–33.4)
MCHC: 33.3 g/dL (ref 32.0–36.0)
MCV: 87 fL (ref 79.3–98.0)
MONO#: 0.5 10*3/uL (ref 0.1–0.9)
MONO%: 6.7 % (ref 0.0–14.0)
NEUT#: 4.5 10*3/uL (ref 1.5–6.5)
NEUT%: 56.9 % (ref 39.0–75.0)
PLATELETS: 240 10*3/uL (ref 140–400)
RBC: 4.28 10*6/uL (ref 4.20–5.82)
RDW: 13.4 % (ref 11.0–14.6)
WBC: 7.9 10*3/uL (ref 4.0–10.3)

## 2015-09-04 LAB — COMPREHENSIVE METABOLIC PANEL (CC13)
ALT: 19 U/L (ref 0–55)
ANION GAP: 9 meq/L (ref 3–11)
AST: 21 U/L (ref 5–34)
Albumin: 3.3 g/dL — ABNORMAL LOW (ref 3.5–5.0)
Alkaline Phosphatase: 402 U/L — ABNORMAL HIGH (ref 40–150)
BILIRUBIN TOTAL: 0.49 mg/dL (ref 0.20–1.20)
BUN: 17.7 mg/dL (ref 7.0–26.0)
CO2: 24 meq/L (ref 22–29)
CREATININE: 1.2 mg/dL (ref 0.7–1.3)
Calcium: 9 mg/dL (ref 8.4–10.4)
Chloride: 107 mEq/L (ref 98–109)
EGFR: 74 mL/min/{1.73_m2} — ABNORMAL LOW (ref >90)
GLUCOSE: 107 mg/dL (ref 70–140)
Potassium: 4.1 mEq/L (ref 3.5–5.1)
Sodium: 140 mEq/L (ref 136–145)
TOTAL PROTEIN: 7.7 g/dL (ref 6.4–8.3)

## 2015-09-04 MED ORDER — DENOSUMAB 120 MG/1.7ML ~~LOC~~ SOLN
120.0000 mg | Freq: Once | SUBCUTANEOUS | Status: AC
  Administered 2015-09-04: 120 mg via SUBCUTANEOUS
  Filled 2015-09-04: qty 1.7

## 2015-09-05 LAB — PSA: PSA: 192 ng/mL — ABNORMAL HIGH (ref <=4.00)

## 2015-10-23 ENCOUNTER — Ambulatory Visit (HOSPITAL_BASED_OUTPATIENT_CLINIC_OR_DEPARTMENT_OTHER): Payer: Managed Care, Other (non HMO) | Admitting: Oncology

## 2015-10-23 ENCOUNTER — Telehealth: Payer: Self-pay | Admitting: Oncology

## 2015-10-23 ENCOUNTER — Ambulatory Visit (HOSPITAL_BASED_OUTPATIENT_CLINIC_OR_DEPARTMENT_OTHER): Payer: Managed Care, Other (non HMO)

## 2015-10-23 ENCOUNTER — Other Ambulatory Visit (HOSPITAL_BASED_OUTPATIENT_CLINIC_OR_DEPARTMENT_OTHER): Payer: Managed Care, Other (non HMO)

## 2015-10-23 VITALS — BP 149/54 | HR 72 | Temp 97.9°F | Resp 18 | Ht 70.0 in | Wt 233.9 lb

## 2015-10-23 DIAGNOSIS — C61 Malignant neoplasm of prostate: Secondary | ICD-10-CM

## 2015-10-23 DIAGNOSIS — C7951 Secondary malignant neoplasm of bone: Secondary | ICD-10-CM

## 2015-10-23 DIAGNOSIS — Z5111 Encounter for antineoplastic chemotherapy: Secondary | ICD-10-CM

## 2015-10-23 LAB — CBC WITH DIFFERENTIAL/PLATELET
BASO%: 0.5 % (ref 0.0–2.0)
Basophils Absolute: 0 10*3/uL (ref 0.0–0.1)
EOS%: 2.3 % (ref 0.0–7.0)
Eosinophils Absolute: 0.1 10*3/uL (ref 0.0–0.5)
HEMATOCRIT: 34.5 % — AB (ref 38.4–49.9)
HGB: 11.7 g/dL — ABNORMAL LOW (ref 13.0–17.1)
LYMPH#: 1.8 10*3/uL (ref 0.9–3.3)
LYMPH%: 29.9 % (ref 14.0–49.0)
MCH: 28.7 pg (ref 27.2–33.4)
MCHC: 33.9 g/dL (ref 32.0–36.0)
MCV: 84.8 fL (ref 79.3–98.0)
MONO#: 0.5 10*3/uL (ref 0.1–0.9)
MONO%: 8.1 % (ref 0.0–14.0)
NEUT#: 3.7 10*3/uL (ref 1.5–6.5)
NEUT%: 59.2 % (ref 39.0–75.0)
Platelets: 237 10*3/uL (ref 140–400)
RBC: 4.07 10*6/uL — AB (ref 4.20–5.82)
RDW: 13.5 % (ref 11.0–14.6)
WBC: 6.2 10*3/uL (ref 4.0–10.3)

## 2015-10-23 LAB — COMPREHENSIVE METABOLIC PANEL
ALT: 20 U/L (ref 0–55)
ANION GAP: 11 meq/L (ref 3–11)
AST: 25 U/L (ref 5–34)
Albumin: 3.4 g/dL — ABNORMAL LOW (ref 3.5–5.0)
Alkaline Phosphatase: 554 U/L — ABNORMAL HIGH (ref 40–150)
BILIRUBIN TOTAL: 0.38 mg/dL (ref 0.20–1.20)
BUN: 29.4 mg/dL — AB (ref 7.0–26.0)
CO2: 22 meq/L (ref 22–29)
CREATININE: 1.3 mg/dL (ref 0.7–1.3)
Calcium: 8.5 mg/dL (ref 8.4–10.4)
Chloride: 106 mEq/L (ref 98–109)
EGFR: 65 mL/min/{1.73_m2} — ABNORMAL LOW (ref >90)
GLUCOSE: 105 mg/dL (ref 70–140)
Potassium: 3.9 mEq/L (ref 3.5–5.1)
Sodium: 140 mEq/L (ref 136–145)
TOTAL PROTEIN: 8.2 g/dL (ref 6.4–8.3)

## 2015-10-23 MED ORDER — DENOSUMAB 120 MG/1.7ML ~~LOC~~ SOLN
120.0000 mg | Freq: Once | SUBCUTANEOUS | Status: AC
  Administered 2015-10-23: 120 mg via SUBCUTANEOUS
  Filled 2015-10-23: qty 1.7

## 2015-10-23 MED ORDER — LEUPROLIDE ACETATE (3 MONTH) 22.5 MG IM KIT
22.5000 mg | PACK | Freq: Once | INTRAMUSCULAR | Status: AC
  Administered 2015-10-23: 22.5 mg via INTRAMUSCULAR
  Filled 2015-10-23: qty 22.5

## 2015-10-23 NOTE — Progress Notes (Signed)
Hematology and Oncology Follow Up Visit  Steven Church [id number] March 17, 1951 65 y.o. 10/23/2015 10:51 AM   Principle Diagnosis: 65 year old gentleman with prostate cancer presented with diffuse bony metastasis and lymphadenopathy. His PSA is 899 with unknown Gleason score. His diagnosis was confirmed in June 2016.   Prior Therapy: He is status post a biopsy obtained of the soft tissue in the retroperitoneal area on 04/03/2015.  Current therapy:  Lupron 22.5 mg IM every 3 months started in June 2016. Casodex given for 1 month and discontinued in August 2016. Xgeva to start on 07/24/2015.  Interim History:  Steven Church presents today for a follow-up visit. Since the last visit, he reports no major complaints. He continues to receive Lupron and Xgeva without any major complications. He has reported some excessive fatigue associated with Lupron that interferes with his work-related duties on the day he receives it. He also has reported symptoms of injection-related pain from the [employer]. Otherwise he continues to be rather functional without any decline in his energy her performance status. He does not report any pain. He does not report any back pain shoulder pain or neuropathy.He continues to work full-time and lives independently. He did report occasional dizziness when he stands up from a sitting position.  He is not report any headaches, blurry vision, double vision, syncope or seizures. He does not report any fevers, chills, sweats, weight loss or appetite changes. He does not report any chest pain, palpitation, orthopnea or leg edema. He does not report any cough, shortness of breath, dyspnea on exertion, wheezing or hemoptysis. He does not report any nausea, vomiting, abdominal pain, hematochezia, melanoma, constipation, diarrhea or loose satiety. He does not report any hematuria or dysuria. He does report frequency, hesitancy and nocturia. He does not report any skeletal complaints of  arthralgias or myalgias. He does not report any lymphadenopathy or petechiae. Remaining review of systems unremarkable.  Medications: I have reviewed the patient's current medications.  Current Outpatient Prescriptions  Medication Sig Dispense Refill  . Aspirin-Caffeine (BC FAST PAIN RELIEF PO) Take 1 Package by mouth as needed (pain).    . Azilsartan-Chlorthalidone (EDARBYCLOR) 40-25 MG TABS Take 1 tablet by mouth every morning. 90 tablet 3  . bicalutamide (CASODEX) 50 MG tablet Take 50 mg by mouth daily.  0  . calcium-vitamin D 250-100 MG-UNIT per tablet Take 1 tablet by mouth 2 (two) times daily. 60 tablet 3  . finasteride (PROSCAR) 5 MG tablet Take 1 tablet (5 mg total) by mouth daily. 30 tablet 0  . fluticasone (FLONASE) 50 MCG/ACT nasal spray Place 2 sprays into both nostrils daily.     Marland Kitchen omeprazole (PRILOSEC) 40 MG capsule Take 1 capsule (40 mg total) by mouth daily. 90 capsule 1  . tamsulosin (FLOMAX) 0.4 MG CAPS capsule Take 1 capsule (0.4 mg total) by mouth daily. 90 capsule 1  . tamsulosin (FLOMAX) 0.4 MG CAPS capsule TAKE ONE CAPSULE BY MOUTH EVERY DAY 30 capsule 2   No current facility-administered medications for this visit.     Allergies: No Known Allergies  Past Medical History, Surgical history, Social history, and Family History were reviewed and updated.   Physical Exam: Blood pressure 149/54, pulse 72, temperature 97.9 F (36.6 C), temperature source Oral, resp. rate 18, height 5\' 10"  (1.778 m), weight 233 lb 14.4 oz (106.096 kg), SpO2 98 %. ECOG: 0 General appearance: alert and cooperative not in any distress. Head: Normocephalic, without obvious abnormality no oral thrush. Neck: no adenopathy Lymph nodes: Cervical,  supraclavicular, and axillary nodes normal. Heart:regular rate and rhythm, S1, S2 normal, no murmur, click, rub or gallop Lung:chest clear, no wheezing, rales, normal symmetric air entry Abdomin: soft, non-tender, without masses or organomegaly no  shifting dullness or ascites. EXT:no erythema, induration, or nodules   Lab Results: Lab Results  Component Value Date   WBC 6.2 10/23/2015   HGB 11.7* 10/23/2015   HCT 34.5* 10/23/2015   MCV 84.8 10/23/2015   PLT 237 10/23/2015     Chemistry      Component Value Date/Time   NA 140 09/04/2015 1001   NA 139 02/27/2015 0001   K 4.1 09/04/2015 1001   K 3.8 02/27/2015 0001   CL 104 02/27/2015 0001   CO2 24 09/04/2015 1001   CO2 25 02/27/2015 0001   BUN 17.7 09/04/2015 1001   BUN 29* 02/27/2015 0001   CREATININE 1.2 09/04/2015 1001   CREATININE 1.26 02/27/2015 0001   CREATININE 1.10 09/30/2011 1453      Component Value Date/Time   CALCIUM 9.0 09/04/2015 1001   CALCIUM 9.6 02/27/2015 0001   ALKPHOS 402* 09/04/2015 1001   AST 21 09/04/2015 1001   ALT 19 09/04/2015 1001   BILITOT 0.49 09/04/2015 1001        Results for Steven Church, Steven Church (MRN [id number]) as of 10/23/2015 09:57  Ref. Range 06/05/2015 14:53 07/24/2015 11:20 09/04/2015 10:01  PSA Latest Ref Range: <=4.00 ng/mL 89.21 (H) 126.60 (H) 192.00 (H)   Impression and Plan:  65 year old gentleman with the following issues:  1. Advanced prostate cancer presenting with diffuse bony metastasis and bulky lymphadenopathy and PSA of 899.8. This is biopsy proven to be prostate cancer.   He is currently on Lupron started in June 2016 and have tolerated it well. Casodex was discontinued after the flare phenomenon have been avoided.  His PSA have cut down dramatically from 899-89. Most recently his PSA started to increase up to 192. Options of treatment were reviewed today and likely would benefit from second line hormone therapy in the form of Zytiga or Xtandi. The plan is to restage her with a bone scan for the next visit and institute this therapy in the near future. I anticipate starting one of those agents likely in February 2017.  2. Bone directed therapy: Risks and benefits of Delton See was discussed again. These complications  include hypocalcemia, osteonecrosis of the jaw and injection-related problems. He is on calcium supplements as well as vitamin D and ready to proceed.  3. Lower urinary tract symptoms: Seems to be improved with Proscar. He is urinating freely without any major issues.  4. Pain: Seems to be reasonably controlled with mild analgesia. No major pain noted.  5. Prognosis: This was discussed today in detail. He understands that he has an incurable malignancy but certainly treatable. He has good performance status and wants to be aggressive in his approach at the time being. No need to change his approach at this time.  6. Follow-up: Will be in 6 weeks after repeat PSA and a bone scan and determination 4 different salvage therapy.     Steven Waterson, MD 1/6/201710:51 AM

## 2015-10-23 NOTE — Telephone Encounter (Signed)
per pof to sch pt appt-gave pt copy of avs °

## 2015-10-24 LAB — PSA: PSA: 295.1 ng/mL — AB (ref <=4.00)

## 2015-10-30 ENCOUNTER — Other Ambulatory Visit: Payer: Self-pay | Admitting: Medical

## 2015-11-03 ENCOUNTER — Other Ambulatory Visit: Payer: Self-pay | Admitting: Medical

## 2015-11-05 ENCOUNTER — Telehealth: Payer: Self-pay | Admitting: Medical

## 2015-11-05 NOTE — Telephone Encounter (Signed)
Make him f/u appt to discuss options and recheck on BP

## 2015-11-05 NOTE — Telephone Encounter (Signed)
Pt made appt for tomorrow

## 2015-11-05 NOTE — Telephone Encounter (Signed)
Pt says that new insurance will not cover Edarbyclor and will have to pay $120+ for this med. Pt thinks he needs a generic blood pressure med so requesting a different med. Also does pt need to be taking Finasteride, if so need refill ASAP. He just refilled Flomax.

## 2015-11-06 ENCOUNTER — Ambulatory Visit (INDEPENDENT_AMBULATORY_CARE_PROVIDER_SITE_OTHER): Payer: Managed Care, Other (non HMO) | Admitting: Medical

## 2015-11-06 ENCOUNTER — Encounter: Payer: Self-pay | Admitting: Medical

## 2015-11-06 VITALS — BP 102/60 | HR 92 | Wt 226.0 lb

## 2015-11-06 DIAGNOSIS — I10 Essential (primary) hypertension: Secondary | ICD-10-CM | POA: Diagnosis not present

## 2015-11-06 MED ORDER — LOSARTAN POTASSIUM-HCTZ 100-25 MG PO TABS: 1.0000 | ORAL_TABLET | Freq: Every day | ORAL | Status: DC

## 2015-11-06 NOTE — Progress Notes (Signed)
Subjective: Chief Complaint  Patient presents with  . discuss meds    needs new bp pill   Here for cost issues with BP medication.   Since late 2015 we switched him from HCTZ to [employer].  Since then BPs have been fine and he has done well on the medication.   However, this past week he went to refill and cost was well over $100 per month which he is not willing to do.   He has tolerated Edarbychlor well.   He denies chest pain, edema, SOB.  He does get a little lightheaded sometimes when he stands up fast, and BPs have been running similar to today's readings.    He is managed by urology currently for prostate cancer.  Past Medical History  Diagnosis Date  . Hypertension   . Obesity   . Prostate cancer ([place]) 04/07/2015    bone mets, started oncology 03/2015  . Family history of cancer   . Former smoker    ROS as in subjective  Objective: BP 102/60 mmHg  Pulse 92  Wt 226 lb (102.513 kg)  General appearance: alert, no distress, WD/WN Neck: supple, no lymphadenopathy, no thyromegaly, no masses Heart: RRR, normal S1, S2, no murmurs Lungs: CTA bilaterally, no wheezes, rhonchi, or rales Pulses: 2+ symmetric, upper and lower extremities, normal cap refill Ext: no edema   Assessment: Encounter Diagnosis  Name Primary?  . Essential hypertension Yes    Plan: Although he has done well on Edarbychlor, we will switch to Losartan HCT due to costs.  Advised he monitor BP, get me readings within a month.  Goal around 120/80.    Angie was seen today for discuss meds.  Diagnoses and all orders for this visit:  Essential hypertension  Other orders -     losartan-hydrochlorothiazide (HYZAAR) 100-25 MG tablet; Take 1 tablet by mouth daily.

## 2015-11-12 ENCOUNTER — Other Ambulatory Visit: Payer: Self-pay | Admitting: Medical

## 2015-12-08 ENCOUNTER — Ambulatory Visit: Payer: Managed Care, Other (non HMO)

## 2015-12-08 ENCOUNTER — Other Ambulatory Visit (HOSPITAL_BASED_OUTPATIENT_CLINIC_OR_DEPARTMENT_OTHER): Payer: Managed Care, Other (non HMO)

## 2015-12-08 DIAGNOSIS — C61 Malignant neoplasm of prostate: Secondary | ICD-10-CM | POA: Diagnosis not present

## 2015-12-08 LAB — COMPREHENSIVE METABOLIC PANEL
ALBUMIN: 3.1 g/dL — AB (ref 3.5–5.0)
ALT: 14 U/L (ref 0–55)
AST: 28 U/L (ref 5–34)
Alkaline Phosphatase: 774 U/L — ABNORMAL HIGH (ref 40–150)
Anion Gap: 11 mEq/L (ref 3–11)
BUN: 20.6 mg/dL (ref 7.0–26.0)
CHLORIDE: 107 meq/L (ref 98–109)
CO2: 22 meq/L (ref 22–29)
CREATININE: 1 mg/dL (ref 0.7–1.3)
Calcium: 8.8 mg/dL (ref 8.4–10.4)
EGFR: 88 mL/min/{1.73_m2} — ABNORMAL LOW (ref >90)
Glucose: 97 mg/dl (ref 70–140)
Potassium: 3.8 mEq/L (ref 3.5–5.1)
SODIUM: 140 meq/L (ref 136–145)
TOTAL PROTEIN: 7.5 g/dL (ref 6.4–8.3)
Total Bilirubin: 0.44 mg/dL (ref 0.20–1.20)

## 2015-12-08 LAB — CBC WITH DIFFERENTIAL/PLATELET
BASO%: 0.3 % (ref 0.0–2.0)
Basophils Absolute: 0 10*3/uL (ref 0.0–0.1)
EOS ABS: 0.3 10*3/uL (ref 0.0–0.5)
EOS%: 4.2 % (ref 0.0–7.0)
HEMATOCRIT: 32.5 % — AB (ref 38.4–49.9)
HEMOGLOBIN: 11.1 g/dL — AB (ref 13.0–17.1)
LYMPH#: 2.5 10*3/uL (ref 0.9–3.3)
LYMPH%: 32.5 % (ref 14.0–49.0)
MCH: 28 pg (ref 27.2–33.4)
MCHC: 34.2 g/dL (ref 32.0–36.0)
MCV: 81.9 fL (ref 79.3–98.0)
MONO#: 0.6 10*3/uL (ref 0.1–0.9)
MONO%: 8.2 % (ref 0.0–14.0)
NEUT%: 54.8 % (ref 39.0–75.0)
NEUTROS ABS: 4.1 10*3/uL (ref 1.5–6.5)
PLATELETS: 228 10*3/uL (ref 140–400)
RBC: 3.97 10*6/uL — ABNORMAL LOW (ref 4.20–5.82)
RDW: 14.4 % (ref 11.0–14.6)
WBC: 7.6 10*3/uL (ref 4.0–10.3)

## 2015-12-09 LAB — PSA: PROSTATE SPECIFIC AG, SERUM: 470.3 ng/mL — AB (ref 0.0–4.0)

## 2015-12-09 LAB — PSA (PARALLEL TESTING): PSA: 438 ng/mL — ABNORMAL HIGH (ref <=4.00)

## 2015-12-11 ENCOUNTER — Ambulatory Visit (HOSPITAL_BASED_OUTPATIENT_CLINIC_OR_DEPARTMENT_OTHER): Payer: Managed Care, Other (non HMO) | Admitting: Oncology

## 2015-12-11 ENCOUNTER — Telehealth: Payer: Self-pay | Admitting: Oncology

## 2015-12-11 ENCOUNTER — Encounter (HOSPITAL_COMMUNITY): Admission: RE | Admit: 2015-12-11 | Payer: Managed Care, Other (non HMO) | Source: Ambulatory Visit

## 2015-12-11 ENCOUNTER — Ambulatory Visit (HOSPITAL_BASED_OUTPATIENT_CLINIC_OR_DEPARTMENT_OTHER): Payer: Managed Care, Other (non HMO)

## 2015-12-11 ENCOUNTER — Telehealth: Payer: Self-pay | Admitting: Pharmacist

## 2015-12-11 ENCOUNTER — Encounter (HOSPITAL_COMMUNITY): Payer: Managed Care, Other (non HMO)

## 2015-12-11 VITALS — BP 119/65 | HR 77 | Temp 98.3°F | Resp 18 | Ht 70.0 in | Wt 220.9 lb

## 2015-12-11 DIAGNOSIS — C7951 Secondary malignant neoplasm of bone: Secondary | ICD-10-CM | POA: Diagnosis not present

## 2015-12-11 DIAGNOSIS — C61 Malignant neoplasm of prostate: Secondary | ICD-10-CM | POA: Diagnosis not present

## 2015-12-11 MED ORDER — DENOSUMAB 120 MG/1.7ML ~~LOC~~ SOLN
120.0000 mg | Freq: Once | SUBCUTANEOUS | Status: AC
  Administered 2015-12-11: 120 mg via SUBCUTANEOUS
  Filled 2015-12-11: qty 1.7

## 2015-12-11 MED ORDER — ENZALUTAMIDE 40 MG PO CAPS: 160.0000 mg | ORAL_CAPSULE | Freq: Every day | ORAL | Status: DC

## 2015-12-11 NOTE — Progress Notes (Signed)
Hematology and Oncology Follow Up Visit  JAJA PROUSE [id number] 1951-07-10 65 y.o. 12/11/2015 12:04 PM   Principle Diagnosis: 65 year old gentleman with prostate cancer presented with diffuse bony metastasis and lymphadenopathy. His PSA is 899 with unknown Gleason score. His diagnosis was confirmed in June 2016.   Prior Therapy: He is status post a biopsy obtained of the soft tissue in the retroperitoneal area on 04/03/2015.  Current therapy:  Lupron 22.5 mg IM every 3 months started in June 2016. Casodex given for 1 month and discontinued in August 2016. Xgeva to start on 07/24/2015.  Interim History:  Mr. Mullings presents today for a follow-up visit. Since the last visit, he continues to do very well. He remains active and performs activities of daily living without any decline.    He continues to receive Lupron and Xgeva without any major complications. He does not report any pain. He does not report any back pain shoulder pain or neuropathy. He continues to work full-time and lives independently. He denied any pathological fractures falls or syncope.  He is not report any headaches, blurry vision, double vision, or seizures. He does not report any fevers, chills, sweats, weight loss or appetite changes. He does not report any chest pain, palpitation, orthopnea or leg edema. He does not report any cough, shortness of breath, dyspnea on exertion, wheezing or hemoptysis. He does not report any nausea, vomiting, abdominal pain, hematochezia, or early satiety. He does not report any hematuria or dysuria. He does report frequency, hesitancy and nocturia. He does not report any skeletal complaints of arthralgias or myalgias. Remaining review of systems unremarkable.  Medications: I have reviewed the patient's current medications.  Current Outpatient Prescriptions  Medication Sig Dispense Refill  . Aspirin-Caffeine (BC FAST PAIN RELIEF PO) Take 1 Package by mouth as needed (pain).    .  bicalutamide (CASODEX) 50 MG tablet Take 50 mg by mouth daily. Reported on 11/06/2015  0  . calcium-vitamin D 250-100 MG-UNIT per tablet Take 1 tablet by mouth 2 (two) times daily. 60 tablet 3  . finasteride (PROSCAR) 5 MG tablet Take 1 tablet (5 mg total) by mouth daily. 30 tablet 0  . fluticasone (FLONASE) 50 MCG/ACT nasal spray Place 2 sprays into both nostrils daily. Reported on 11/06/2015    . losartan-hydrochlorothiazide (HYZAAR) 100-25 MG tablet Take 1 tablet by mouth daily. 90 tablet 1  . omeprazole (PRILOSEC) 40 MG capsule TAKE 1 CAPSULE (40 MG TOTAL) BY MOUTH DAILY. 90 capsule 1  . tamsulosin (FLOMAX) 0.4 MG CAPS capsule TAKE ONE CAPSULE BY MOUTH EVERY DAY 30 capsule 1  . enzalutamide (XTANDI) 40 MG capsule Take 4 capsules (160 mg total) by mouth daily. 120 capsule 0   No current facility-administered medications for this visit.     Allergies: No Known Allergies  Past Medical History, Surgical history, Social history, and Family History were reviewed and updated.   Physical Exam: Blood pressure 119/65, pulse 77, temperature 98.3 F (36.8 C), temperature source Oral, resp. rate 18, height 5\' 10"  (1.778 m), weight 220 lb 14.4 oz (100.2 kg), SpO2 98 %. ECOG: 0 General appearance: alert and cooperative without distress. Head: Normocephalic, without obvious abnormality no oral thrush. Neck: no adenopathy Lymph nodes: Cervical, supraclavicular, and axillary nodes normal. Heart:regular rate and rhythm, S1, S2 normal, no murmur, click, rub or gallop Lung:chest clear, no wheezing, rales, normal symmetric air entry Abdomin: soft, non-tender, without masses or organomegaly no rebound or guarding. EXT:no erythema, induration, or nodules Neurological examination: No deficits  noted.  Lab Results: Lab Results  Component Value Date   WBC 7.6 12/08/2015   HGB 11.1* 12/08/2015   HCT 32.5* 12/08/2015   MCV 81.9 12/08/2015   PLT 228 12/08/2015     Chemistry      Component Value  Date/Time   NA 140 12/08/2015 0746   NA 139 02/27/2015 0001   K 3.8 12/08/2015 0746   K 3.8 02/27/2015 0001   CL 104 02/27/2015 0001   CO2 22 12/08/2015 0746   CO2 25 02/27/2015 0001   BUN 20.6 12/08/2015 0746   BUN 29* 02/27/2015 0001   CREATININE 1.0 12/08/2015 0746   CREATININE 1.26 02/27/2015 0001   CREATININE 1.10 09/30/2011 1453      Component Value Date/Time   CALCIUM 8.8 12/08/2015 0746   CALCIUM 9.6 02/27/2015 0001   ALKPHOS 774* 12/08/2015 0746   AST 28 12/08/2015 0746   ALT 14 12/08/2015 0746   BILITOT 0.44 12/08/2015 0746     Results for MATO, KLAY (MRN [id number]) as of 12/11/2015 12:06  Ref. Range 07/24/2015 11:20 09/04/2015 10:01 10/23/2015 10:23 12/08/2015 07:46  PSA Latest Ref Range: <=4.00 ng/mL 126.60 (H) 192.00 (H) 295.10 (H) 438.00 (H)      Impression and Plan:  65 year old gentleman with the following issues:  1. Advanced prostate cancer presenting with diffuse bony metastasis and bulky lymphadenopathy and PSA of 899.8. This is biopsy proven to be prostate cancer.   He is currently on Lupron started in June 2016 and have tolerated it well.   His PSA have cut down dramatically from 899 to 89 initially. His PSA started to rise in October 2016 and now is up to 438. 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  Options of therapy were reviewed today including Zytiga, Xtandi or possible systemic chemotherapy. The risks and benefits of all these options were reviewed today and he is agreeable to proceed with Xtandi. Complications include fatigue, nausea, lower extremity edema among others were reviewed. Rarely seizures have been reported with this medication but is unlikely. His agreeable to proceed at this time for a total of 160 mg daily dose. This dose can be adjusted in the future depending on his tolerance.  2. Bone directed therapy: He received Xgeva without any major complications. Potential complications were reviewed  again including  hypocalcemia, osteonecrosis of the jaw and injection-related problems. He is on calcium supplements as well as vitamin D and ready to proceed.  3. Lower urinary tract symptoms: Seems to be improved with Proscar. No major changes noted.  4. Pain: Seems to be reasonably controlled with mild analgesia. No changes noted.  5. Prognosis: The goal of therapy remains palliative at this time with incurable malignancy.  6. Follow-up: Will be in 6 weeks for a repeat PSA and repeat Lupron and Xgeva injection. We will also assess complications related to [facility].     [facility], MD 2/24/201712:04 PM

## 2015-12-11 NOTE — Telephone Encounter (Signed)
2-24-17[name] Rx must be filled through [place], enrolled patient and faxed letter of medical necessity.

## 2015-12-11 NOTE — Telephone Encounter (Signed)
12-11-15: New Rx for [facility] faxed to [facility]

## 2015-12-11 NOTE — Telephone Encounter (Signed)
Pt confirmed labs/ov/inj per 02/24 POF,gave pt AVS and Calendar... KJ

## 2015-12-14 ENCOUNTER — Other Ambulatory Visit: Payer: Self-pay | Admitting: *Deleted

## 2015-12-14 MED ORDER — ENZALUTAMIDE 40 MG PO CAPS: 160.0000 mg | ORAL_CAPSULE | Freq: Every day | ORAL | Status: DC

## 2015-12-15 ENCOUNTER — Encounter: Payer: Self-pay | Admitting: Oncology

## 2015-12-15 NOTE — Progress Notes (Signed)
[facility] xtandi per their req   [phone]

## 2015-12-18 ENCOUNTER — Other Ambulatory Visit: Payer: Self-pay | Admitting: Oncology

## 2015-12-18 DIAGNOSIS — C61 Malignant neoplasm of prostate: Secondary | ICD-10-CM

## 2015-12-18 MED ORDER — ABIRATERONE ACETATE 250 MG PO TABS: 1000.0000 mg | ORAL_TABLET | Freq: Every day | ORAL | Status: DC

## 2015-12-21 ENCOUNTER — Other Ambulatory Visit: Payer: Self-pay | Admitting: Pharmacist

## 2015-12-21 DIAGNOSIS — C61 Malignant neoplasm of prostate: Secondary | ICD-10-CM

## 2015-12-21 MED ORDER — ABIRATERONE ACETATE 250 MG PO TABS: 1000.0000 mg | ORAL_TABLET | Freq: Every day | ORAL | Status: DC

## 2015-12-23 ENCOUNTER — Telehealth: Payer: Self-pay | Admitting: Pharmacist

## 2015-12-23 NOTE — Telephone Encounter (Signed)
12/23/15: [facility] denied [facility]andi rx. Discussed with Dr. [name]. He would prefer to change pt to [facility]. New rx written and [facility]ed to [facility]. Called patient to discuss change and answer any questions. Left voicemail as patient did not answer. Instructed patient to call back to discuss.   Thank you,  Montel Clock, PharmD, [facility]

## 2016-01-01 ENCOUNTER — Telehealth: Payer: Self-pay | Admitting: Pharmacist

## 2016-01-01 NOTE — Telephone Encounter (Signed)
01/01/16: attempted to call patient again regarding new Zytiga medication. Unable to reach patient. Left VM for patient to call back and discuss. Steven Church is approved and [facility] so cost should be $25 or less. [facility] has tried calling patient to set up delivery for Zytiga as well but has been unable to reach the patient. I left [facility] number for Steven Church to call to set up shipment (Tel: [phone])  Thank you,  Montel Clock, PharmD, [facility]

## 2016-01-07 ENCOUNTER — Telehealth: Payer: Self-pay | Admitting: Medical

## 2016-01-07 NOTE — Telephone Encounter (Signed)
Rcvd 90 day refill request for Finasteride 5mg 

## 2016-01-08 ENCOUNTER — Other Ambulatory Visit: Payer: Self-pay | Admitting: Medical

## 2016-01-08 MED ORDER — FINASTERIDE 5 MG PO TABS: 5.0000 mg | ORAL_TABLET | Freq: Every day | ORAL | Status: DC

## 2016-01-08 NOTE — Telephone Encounter (Signed)
done

## 2016-01-11 ENCOUNTER — Encounter: Payer: Self-pay | Admitting: Medical

## 2016-01-11 ENCOUNTER — Ambulatory Visit (INDEPENDENT_AMBULATORY_CARE_PROVIDER_SITE_OTHER): Payer: Managed Care, Other (non HMO) | Admitting: Medical

## 2016-01-11 VITALS — BP 150/100 | HR 93 | Wt 210.0 lb

## 2016-01-11 DIAGNOSIS — I10 Essential (primary) hypertension: Secondary | ICD-10-CM | POA: Diagnosis not present

## 2016-01-11 DIAGNOSIS — R42 Dizziness and giddiness: Secondary | ICD-10-CM

## 2016-01-11 MED ORDER — LOSARTAN POTASSIUM 100 MG PO TABS: 100.0000 mg | ORAL_TABLET | Freq: Every day | ORAL | Status: DC

## 2016-01-11 NOTE — Progress Notes (Signed)
Subjective: Chief Complaint  Patient presents with  . bp    stopped taking 3 wks ago. hasnt checked bp. medication was making him feel dizzy.     Here for BP med concern.   He was having quite frequent dizziness on Losartan HCT.  He stopped this 3 weeks ago and dizziness resolved.   He denies chest pain, palpation, edema, sob.  In the past had done ok on Edarbychlor but insurance wouldn't cover this .  Has also been on plain HCTZ and plain chlorthalidone in the past.  Otherwise no c/o.   Still seeing oncology regularly, compliant with treatment.  No other aggravating or relieving factors. No other complaint.  Past Medical History  Diagnosis Date  . Hypertension   . Obesity   . Prostate cancer ([place]) 04/07/2015    bone mets, started oncology 03/2015  . Family history of cancer   . Former smoker    ROS as in subjective  Objective: BP 150/100 mmHg  Pulse 93  Wt 210 lb (95.255 kg)  BP Readings from Last 3 Encounters:  01/11/16 150/100  12/11/15 119/65  11/06/15 102/60   Wt Readings from Last 3 Encounters:  01/11/16 210 lb (95.255 kg)  12/11/15 220 lb 14.4 oz (100.2 kg)  11/06/15 226 lb (102.513 kg)   General appearance: alert, no distress, WD/WN,  HEENT: normocephalic, sclerae anicteric, TMs pearly, nares patent, no discharge or erythema, pharynx normal Oral cavity: MMM, no lesions Neck: supple, no lymphadenopathy, no thyromegaly, no masses Heart: RRR, normal S1, S2, no murmurs Lungs: CTA bilaterally, no wheezes, rhonchi, or rales Abdomen: +bs, soft, non tender, non distended, no masses, no hepatomegaly, no splenomegaly Pulses: 2+ symmetric, upper and lower extremities, normal cap refill   Assessment: Encounter Diagnoses  Name Primary?  . Essential hypertension Yes  . Dizziness and giddiness     Plan: Reviewed his concerns, recent labs in 11/2015.  Change to plain Losartan 100mg  daily.  Stop Losartan HCT.   Monitor BPs, and recheck in 32mo, sooner prn.    Of note, has  failed plain HCT and chlorthalidone, did well on Edarbychlor, but insurance wouldn't cover this.

## 2016-01-13 ENCOUNTER — Telehealth: Payer: Self-pay | Admitting: Family Medicine

## 2016-01-13 NOTE — Telephone Encounter (Signed)
Pt called and lost his cozar and [facility].  Called same.

## 2016-01-21 ENCOUNTER — Encounter: Payer: Self-pay | Admitting: Medical

## 2016-01-21 ENCOUNTER — Ambulatory Visit (INDEPENDENT_AMBULATORY_CARE_PROVIDER_SITE_OTHER): Payer: Managed Care, Other (non HMO) | Admitting: Medical

## 2016-01-21 VITALS — BP 130/80 | HR 88 | Wt 206.0 lb

## 2016-01-21 DIAGNOSIS — R29810 Facial weakness: Secondary | ICD-10-CM | POA: Diagnosis not present

## 2016-01-21 DIAGNOSIS — H02402 Unspecified ptosis of left eyelid: Secondary | ICD-10-CM | POA: Diagnosis not present

## 2016-01-21 DIAGNOSIS — R479 Unspecified speech disturbances: Secondary | ICD-10-CM | POA: Diagnosis not present

## 2016-01-21 DIAGNOSIS — C61 Malignant neoplasm of prostate: Secondary | ICD-10-CM | POA: Diagnosis not present

## 2016-01-21 DIAGNOSIS — R4781 Slurred speech: Secondary | ICD-10-CM | POA: Diagnosis not present

## 2016-01-21 DIAGNOSIS — I10 Essential (primary) hypertension: Secondary | ICD-10-CM

## 2016-01-21 DIAGNOSIS — H5340 Unspecified visual field defects: Secondary | ICD-10-CM

## 2016-01-21 LAB — CBC WITH DIFFERENTIAL/PLATELET
BASOS ABS: 0 {cells}/uL (ref 0–200)
BASOS PCT: 0 %
EOS ABS: 106 {cells}/uL (ref 15–500)
Eosinophils Relative: 2 %
HEMATOCRIT: 29.9 % — AB (ref 38.5–50.0)
HEMOGLOBIN: 10.2 g/dL — AB (ref 13.2–17.1)
LYMPHS ABS: 1590 {cells}/uL (ref 850–3900)
Lymphocytes Relative: 30 %
MCH: 27.2 pg (ref 27.0–33.0)
MCHC: 34.1 g/dL (ref 32.0–36.0)
MCV: 79.7 fL — AB (ref 80.0–100.0)
MONO ABS: 424 {cells}/uL (ref 200–950)
MPV: 9.8 fL (ref 7.5–12.5)
Monocytes Relative: 8 %
NEUTROS ABS: 3180 {cells}/uL (ref 1500–7800)
Neutrophils Relative %: 60 %
Platelets: 254 10*3/uL (ref 140–400)
RBC: 3.75 MIL/uL — ABNORMAL LOW (ref 4.20–5.80)
RDW: 16.4 % — ABNORMAL HIGH (ref 11.0–15.0)
WBC: 5.3 10*3/uL (ref 4.0–10.5)

## 2016-01-21 LAB — APTT: APTT: 26 s (ref 24–37)

## 2016-01-21 LAB — COMPREHENSIVE METABOLIC PANEL
ALT: 13 U/L (ref 9–46)
AST: 28 U/L (ref 10–35)
Albumin: 3.6 g/dL (ref 3.6–5.1)
Alkaline Phosphatase: 901 U/L — ABNORMAL HIGH (ref 40–115)
BILIRUBIN TOTAL: 0.6 mg/dL (ref 0.2–1.2)
BUN: 13 mg/dL (ref 7–25)
CALCIUM: 9 mg/dL (ref 8.6–10.3)
CHLORIDE: 107 mmol/L (ref 98–110)
CO2: 23 mmol/L (ref 20–31)
Creat: 1.02 mg/dL (ref 0.70–1.25)
GLUCOSE: 101 mg/dL — AB (ref 65–99)
POTASSIUM: 3.8 mmol/L (ref 3.5–5.3)
Sodium: 141 mmol/L (ref 135–146)
Total Protein: 7.1 g/dL (ref 6.1–8.1)

## 2016-01-21 LAB — D-DIMER, QUANTITATIVE: D-Dimer, Quant: 10.86 ug/mL-FEU — ABNORMAL HIGH (ref 0.00–0.48)

## 2016-01-21 LAB — PROTIME-INR
INR: 1.29 (ref <1.50)
Prothrombin Time: 16.3 seconds — ABNORMAL HIGH (ref 11.6–15.2)

## 2016-01-21 NOTE — Progress Notes (Signed)
Subjective: Chief Complaint  Patient presents with  . Follow-up    pt is having trouble getting his words out and is struggling to think about what to say. it has made talking very difficult for him. it has even changed the way his voice sounds. pt does stutter a lot and get tongue tied and can tell he is struggling to get words out. lt eye is partially closed.    Steven Church is a 65yo AA male with a hx/o hypertension, prostate cancer with metastasis undergoing treatment, and former smoker, here for speech changes, eye lid drooping.  I just saw him recently on 01/11/16 for dizziness and not tolerating his BP medication.  He had stopped it for 2 weeks given the dizziness.  When I last saw him on 01/11/16 he only reported intermittent dizziness that has resolved several days before he came here.   However, he was off his BP medication for a 2 week span prior to his visit here on 01/11/16.  He had no other neurologic symptoms at that time. However, he notes sometime within the past 1.5 week he developed difficulty with some of his words, stuttering, and left eye lid drooping was pointed out by a coworker.   He notes that a coworker commented on his speech last week, and his boss sent him home few days ago due to his symptoms and advised he be seen by his doctor.   He is worried that he can't get his words straight.   He did start Losartan, changed from Losartan HCT last visit.   He denies edema, chest pain, SOB, nausea, sweats.  Denies any other numbness, tinging, weakness. No other aggravating or relieving factors. No other complaint.  He lives alone.  Past Medical History  Diagnosis Date  . Hypertension   . Obesity   . Prostate cancer ([place]) 04/07/2015    bone mets, started oncology 03/2015  . Family history of cancer   . Former smoker     Past Surgical History  Procedure Laterality Date  . Colonoscopy      never    Social History   Social History  . Marital Status: Divorced    Spouse Name:  N/A  . Number of Children: N/A  . Years of Education: N/A   Occupational History  . Not on file.   Social History Main Topics  . Smoking status: Former Smoker -- 1.00 packs/day for 18 years  . Smokeless tobacco: Not on file  . Alcohol Use: No  . Drug Use: No  . Sexual Activity: Yes   Other Topics Concern  . 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  Exercise with walking    Family History  Problem Relation Age of Onset  . Hypertension Mother   . Other Mother     died of old age  . Diabetes Father     died of diabetic infection complications  . Cancer Sister     lung  . Alcohol abuse Brother   . Obesity Sister   . Hypertension Brother      Current outpatient prescriptions:  .  finasteride (PROSCAR) 5 MG tablet, Take 1 tablet (5 mg total) by mouth daily., Disp: 90 tablet, Rfl: 0 .  losartan (COZAAR) 100 MG tablet, Take 1 tablet (100 mg total) by mouth daily., Disp: 30 tablet, Rfl: 3 .  omeprazole (PRILOSEC) 40 MG capsule, TAKE 1  CAPSULE (40 MG TOTAL) BY MOUTH DAILY., Disp: 90 capsule, Rfl: 1 .  tamsulosin (FLOMAX) 0.4 MG CAPS capsule, TAKE ONE CAPSULE BY MOUTH EVERY DAY, Disp: 30 capsule, Rfl: 1 .  abiraterone Acetate (ZYTIGA) 250 MG tablet, Take 4 tablets (1,000 mg total) by mouth daily. Take on an empty stomach 1 hour before or 2 hours after a meal (Patient not taking: Reported on 01/21/2016), Disp: 120 tablet, Rfl: 0 .  Aspirin-Caffeine (BC FAST PAIN RELIEF PO), Take 1 Package by mouth as needed (pain). Reported on 01/21/2016, Disp: , Rfl:  .  bicalutamide (CASODEX) 50 MG tablet, Take 50 mg by mouth daily. Reported on 01/21/2016, Disp: , Rfl: 0 .  calcium-vitamin D 250-100 MG-UNIT per tablet, Take 1 tablet by mouth 2 (two) times daily. (Patient not taking: Reported on 01/21/2016), Disp: 60 tablet, Rfl: 3 .  fluticasone (FLONASE) 50 MCG/ACT nasal spray, Place 2 sprays into both  nostrils daily. Reported on 01/21/2016, Disp: , Rfl:   Allergies  Allergen Reactions  . Losartan Potassium-Hctz Other (See Comments)    dizziness     Objective: BP 130/80 mmHg  Pulse 88  Wt 206 lb (93.441 kg)  BP Readings from Last 3 Encounters:  01/21/16 130/80  01/11/16 150/100  12/11/15 119/65    General appearence: alert, no distress, WD/WN, AA male HEENT: normocephalic, sclerae anicteric, PERRLA, EOMi, nares patent, no discharge or erythema, pharynx normal Oral cavity: MMM, no lesions Neck: supple, no lymphadenopathy, no thyromegaly, no masses, no bruits Heart: RRR, normal S1, S2, no murmurs Lungs: CTA bilaterally, no wheezes, rhonchi, or rales Musculoskeletal: nontender, no swelling, no obvious deformity Extremities: no edema, no cyanosis, no clubbing Pulses: 2+ symmetric, upper and lower extremities, normal cap refill Neurological: +ovious left upper eyelid ptosis, he has difficulty forming some words or getting words to come out, stuttering at times, left cheek weakness, eye closing mildly difficult, some over pointing on finger to nose, DTRs not appreciated in general, right upper quadrant visual field seems impaired, alert, oriented x 3, CN2-12 otherwise intact, strength normal upper extremities and lower extremities, sensation normal throughout, no cerebellar signs, gait normal Psychiatric: speech difficult at times, but seems to be cognitively processing things fine, normal affect, behavior normal, pleasant     Adult ECG Report  Indication: possible recent stroke  Rate: 77 bpm  Rhythm: normal sinus rhythm  QRS Axis: -15 degrees  PR Interval: 111ms  QRS Duration: 33ms  QTc: 453ms  Conduction Disturbances: possible prolonged QT, T wave inversions V4, V5, V6, I, II  Other Abnormalities: none  Patient's cardiac risk factors are: advanced age (older than 10 for men, 67 for women), dyslipidemia, hypertension, male gender and obesity (BMI >= 30 kg/m2).  EKG comparison:  compared to 05/2015 EKG, there are new T wave inversions in V4-V5.    Narrative Interpretation: abnormal EKG, possible ischemic changes   MMSE 29/30.   Assessment: Encounter Diagnoses  Name Primary?  . Speech abnormality Yes  . Ptosis, left   . Slurred speech   . Facial weakness   . Essential hypertension   . Visual field defect   . Prostate cancer [facility])      Plan: discussed case with supervising physician.   discussed possible differential.  Could be bells palsy, but I am worried about recent stroke.  Given his hx/o prostate cancer with mets, can't rule out mass either.   We arranged to have MRI tomorrow, STAT labs today, there are new EKG changes as well.  We also  have him seeing cardiology tomorrow.    I discussed this possibility of this being a stroke and answered his questions.    We will call today with STAT labs, he is aware of the MRI appt, we will call to advised him of the cardiology appt time for tomorrow afternoon.  Advised if any new or changing symptoms to call 911.    If MRI negative, consider carotid ultrasound, consider ST, PT, OT, neuro consult.   Steven Church was seen today for follow-up.  Diagnoses and all orders for this visit:  Speech abnormality -     EKG 12-Lead -     Comprehensive metabolic panel -     CBC with Differential/Platelet -     Protime-INR -     APTT -     D-dimer, quantitative (not at [facility]) -     Cancel: CT Head Wo Contrast; Future -     MR Brain Wo Contrast; Future  Ptosis, left -     EKG 12-Lead -     Comprehensive metabolic panel -     CBC with Differential/Platelet -     Protime-INR -     APTT -     D-dimer, quantitative (not at [facility]) -     Cancel: CT Head Wo Contrast; Future -     MR Brain Wo Contrast; Future  Slurred speech -     MR Brain Wo Contrast; Future  Facial weakness -     EKG 12-Lead -     Comprehensive metabolic panel -     CBC with Differential/Platelet -     Protime-INR -     APTT -     D-dimer, quantitative  (not at [facility]) -     Cancel: CT Head Wo Contrast; Future -     MR Brain Wo Contrast; Future  Essential hypertension -     EKG 12-Lead -     Comprehensive metabolic panel -     CBC with Differential/Platelet -     Protime-INR -     APTT -     D-dimer, quantitative (not at [facility]) -     Cancel: CT Head Wo Contrast; Future -     MR Brain Wo Contrast; Future  Visual field defect -     MR Brain Wo Contrast; Future  Prostate cancer ([facility]) -     EKG 12-Lead -     Comprehensive metabolic panel -     CBC with Differential/Platelet -     Protime-INR -     APTT -     D-dimer, quantitative (not at [facility]) -     Cancel: CT Head Wo Contrast; Future -     MR Brain Wo Contrast; Future

## 2016-01-22 ENCOUNTER — Ambulatory Visit: Payer: Managed Care, Other (non HMO)

## 2016-01-22 ENCOUNTER — Ambulatory Visit
Admission: RE | Admit: 2016-01-22 | Discharge: 2016-01-22 | Disposition: A | Discharge status: Home / Self Care | Payer: Managed Care, Other (non HMO) | Source: Ambulatory Visit | Attending: Medical | Admitting: Medical

## 2016-01-22 ENCOUNTER — Other Ambulatory Visit (HOSPITAL_BASED_OUTPATIENT_CLINIC_OR_DEPARTMENT_OTHER): Payer: Managed Care, Other (non HMO)

## 2016-01-22 ENCOUNTER — Ambulatory Visit (HOSPITAL_BASED_OUTPATIENT_CLINIC_OR_DEPARTMENT_OTHER): Payer: Managed Care, Other (non HMO) | Admitting: Oncology

## 2016-01-22 ENCOUNTER — Telehealth: Payer: Self-pay | Admitting: Oncology

## 2016-01-22 ENCOUNTER — Ambulatory Visit: Payer: Managed Care, Other (non HMO) | Admitting: Interventional Cardiology

## 2016-01-22 ENCOUNTER — Other Ambulatory Visit: Payer: Self-pay | Admitting: Medical

## 2016-01-22 ENCOUNTER — Encounter: Payer: Self-pay | Admitting: Radiation Oncology

## 2016-01-22 VITALS — BP 150/84 | HR 86 | Temp 98.1°F | Resp 18 | Wt 210.9 lb

## 2016-01-22 DIAGNOSIS — C61 Malignant neoplasm of prostate: Secondary | ICD-10-CM

## 2016-01-22 DIAGNOSIS — R4781 Slurred speech: Secondary | ICD-10-CM

## 2016-01-22 DIAGNOSIS — I10 Essential (primary) hypertension: Secondary | ICD-10-CM

## 2016-01-22 DIAGNOSIS — R29810 Facial weakness: Secondary | ICD-10-CM

## 2016-01-22 DIAGNOSIS — R479 Unspecified speech disturbances: Secondary | ICD-10-CM

## 2016-01-22 DIAGNOSIS — H02402 Unspecified ptosis of left eyelid: Secondary | ICD-10-CM

## 2016-01-22 DIAGNOSIS — H5340 Unspecified visual field defects: Secondary | ICD-10-CM

## 2016-01-22 DIAGNOSIS — C7951 Secondary malignant neoplasm of bone: Secondary | ICD-10-CM | POA: Diagnosis not present

## 2016-01-22 DIAGNOSIS — C7932 Secondary malignant neoplasm of cerebral meninges: Secondary | ICD-10-CM

## 2016-01-22 LAB — COMPREHENSIVE METABOLIC PANEL
ALT: 13 U/L (ref 0–55)
ANION GAP: 10 meq/L (ref 3–11)
AST: 37 U/L — AB (ref 5–34)
Albumin: 3.2 g/dL — ABNORMAL LOW (ref 3.5–5.0)
Alkaline Phosphatase: 1080 U/L — ABNORMAL HIGH (ref 40–150)
BUN: 16.7 mg/dL (ref 7.0–26.0)
CHLORIDE: 109 meq/L (ref 98–109)
CO2: 25 meq/L (ref 22–29)
CREATININE: 1.4 mg/dL — AB (ref 0.7–1.3)
Calcium: 9.3 mg/dL (ref 8.4–10.4)
EGFR: 61 mL/min/{1.73_m2} — ABNORMAL LOW (ref >90)
Glucose: 100 mg/dl (ref 70–140)
Potassium: 3.5 mEq/L (ref 3.5–5.1)
SODIUM: 144 meq/L (ref 136–145)
Total Bilirubin: 0.63 mg/dL (ref 0.20–1.20)
Total Protein: 7.8 g/dL (ref 6.4–8.3)

## 2016-01-22 LAB — CBC WITH DIFFERENTIAL/PLATELET
BASO%: 0.3 % (ref 0.0–2.0)
Basophils Absolute: 0 10*3/uL (ref 0.0–0.1)
EOS ABS: 0.2 10*3/uL (ref 0.0–0.5)
EOS%: 2.4 % (ref 0.0–7.0)
HCT: 33.3 % — ABNORMAL LOW (ref 38.4–49.9)
HGB: 10.6 g/dL — ABNORMAL LOW (ref 13.0–17.1)
LYMPH%: 19.7 % (ref 14.0–49.0)
MCH: 26.1 pg — ABNORMAL LOW (ref 27.2–33.4)
MCHC: 32 g/dL (ref 32.0–36.0)
MCV: 81.7 fL (ref 79.3–98.0)
MONO#: 0.5 10*3/uL (ref 0.1–0.9)
MONO%: 8 % (ref 0.0–14.0)
NEUT%: 69.6 % (ref 39.0–75.0)
NEUTROS ABS: 4.6 10*3/uL (ref 1.5–6.5)
Platelets: 232 10*3/uL (ref 140–400)
RBC: 4.07 10*6/uL — AB (ref 4.20–5.82)
RDW: 16.5 % — AB (ref 11.0–14.6)
WBC: 6.6 10*3/uL (ref 4.0–10.3)
lymph#: 1.3 10*3/uL (ref 0.9–3.3)

## 2016-01-22 MED ORDER — DEXAMETHASONE 4 MG PO TABS: 4.0000 mg | ORAL_TABLET | Freq: Three times a day (TID) | ORAL | Status: DC

## 2016-01-22 MED ORDER — LEUPROLIDE ACETATE (3 MONTH) 22.5 MG IM KIT
22.5000 mg | PACK | Freq: Once | INTRAMUSCULAR | Status: DC
  Filled 2016-01-22: qty 22.5

## 2016-01-22 MED ORDER — GADOBENATE DIMEGLUMINE 529 MG/ML IV SOLN
20.0000 mL | Freq: Once | INTRAVENOUS | Status: AC | PRN
  Administered 2016-01-22: 20 mL via INTRAVENOUS

## 2016-01-22 MED ORDER — DENOSUMAB 120 MG/1.7ML ~~LOC~~ SOLN
120.0000 mg | Freq: Once | SUBCUTANEOUS | Status: DC
  Filled 2016-01-22: qty 1.7

## 2016-01-22 NOTE — Progress Notes (Signed)
Patient left before nurse able to give injections.  MD made aware of this, will give at next visit.

## 2016-01-22 NOTE — Telephone Encounter (Signed)
per pof to sch pt appt-gave pt copy of avs °

## 2016-01-22 NOTE — Progress Notes (Signed)
Hematology and Oncology Follow Up Visit  Steven Church [id number] 07-24-1951 65 y.o. 01/22/2016 12:09 PM   Principle Diagnosis: 65 year old gentleman with prostate cancer presented with diffuse bony metastasis and lymphadenopathy. His PSA is 899 with unknown Gleason score. His diagnosis was confirmed in June 2016.   Prior Therapy: He is status post a biopsy obtained of the soft tissue in the retroperitoneal area on 04/03/2015.  Current therapy:  Lupron 22.5 mg IM every 3 months started in June 2016. Casodex given for 1 month and discontinued in August 2016. Xgeva to start on 07/24/2015.  He is under consideration to start Zytiga although not received yet.  Interim History:  Steven Church presents today for a follow-up visit. Since the last visit, he developed dizziness and was evaluated by his primary care provider and his blood pressure medication and been adjusted. He also developed speech difficulties and facial droop and word finding difficulties. He had an MRI done on 01/22/2016 which showed a large amount of dural tumor of the left cerebellar convexity with subcentimeter lesions in the left frontal lobe. These findings are consistent with metastatic disease. Despite these findings, he feels relatively fair. He is able to ambulate without any major difficulties. Has not reported any headaches, blurry vision or syncope or seizures.   He continues to receive Lupron and Xgeva without any major complications. He does not report any pain. He does not report any back pain shoulder pain or neuropathy. He has not received Zytiga at this time although attempts to reach him by our pharmacist have failed previously.  Marland Kitchen He does not report any fevers, chills, sweats, weight loss or appetite changes. He does not report any chest pain, palpitation, orthopnea or leg edema. He does not report any cough, shortness of breath, dyspnea on exertion, wheezing or hemoptysis. He does not report any nausea,  vomiting, abdominal pain, hematochezia, or early satiety. He does not report any hematuria or dysuria. He does report frequency, hesitancy and nocturia. He does not report any skeletal complaints of arthralgias or myalgias. Remaining review of systems unremarkable.  Medications: I have reviewed the patient's current medications.  Current Outpatient Prescriptions  Medication Sig Dispense Refill  . abiraterone Acetate (ZYTIGA) 250 MG tablet Take 4 tablets (1,000 mg total) by mouth daily. Take on an empty stomach 1 hour before or 2 hours after a meal (Patient not taking: Reported on 01/21/2016) 120 tablet 0  . Aspirin-Caffeine (BC FAST PAIN RELIEF PO) Take 1 Package by mouth as needed (pain). Reported on 01/21/2016    . bicalutamide (CASODEX) 50 MG tablet Take 50 mg by mouth daily. Reported on 01/21/2016  0  . calcium-vitamin D 250-100 MG-UNIT per tablet Take 1 tablet by mouth 2 (two) times daily. (Patient not taking: Reported on 01/21/2016) 60 tablet 3  . dexamethasone (DECADRON) 4 MG tablet Take 1 tablet (4 mg total) by mouth 3 (three) times daily. 60 tablet 0  . finasteride (PROSCAR) 5 MG tablet Take 1 tablet (5 mg total) by mouth daily. 90 tablet 0  . fluticasone (FLONASE) 50 MCG/ACT nasal spray Place 2 sprays into both nostrils daily. Reported on 01/21/2016    . losartan (COZAAR) 100 MG tablet Take 1 tablet (100 mg total) by mouth daily. 30 tablet 3  . omeprazole (PRILOSEC) 40 MG capsule TAKE 1 CAPSULE (40 MG TOTAL) BY MOUTH DAILY. 90 capsule 1  . tamsulosin (FLOMAX) 0.4 MG CAPS capsule TAKE ONE CAPSULE BY MOUTH EVERY DAY 30 capsule 1   No current  facility-administered medications for this visit.     Allergies:  Allergies  Allergen Reactions  . Losartan Potassium-Hctz Other (See Comments)    dizziness    Past Medical History, Surgical history, Social history, and Family History were reviewed and updated.   Physical Exam: Blood pressure 150/84, pulse 86, temperature 98.1 F (36.7 C),  temperature source Oral, resp. rate 18, weight 210 lb 14.4 oz (95.664 kg), SpO2 98 %. ECOG: 1 General appearance: Alert, awake gentleman appeared without distress. Head: Normocephalic, without obvious abnormality no oral ulcers or lesions. Neck: no adenopathy Lymph nodes: Cervical, supraclavicular, and axillary nodes normal. Heart:regular rate and rhythm, S1, S2 normal, no murmur, click, rub or gallop Lung:chest clear, no wheezing, rales, normal symmetric air entry Abdomin: soft, non-tender, without masses or organomegaly no shifting dullness or ascites. EXT:no erythema, induration, or nodules Neurological examination: Facial droop are noted. Word finding difficulties noted. No motor, sensory or deep tendon reflex abnormalities.  Lab Results: Lab Results  Component Value Date   WBC 6.6 01/22/2016   HGB 10.6* 01/22/2016   HCT 33.3* 01/22/2016   MCV 81.7 01/22/2016   PLT 232 01/22/2016     Chemistry      Component Value Date/Time   NA 144 01/22/2016 1115   NA 141 01/21/2016 0001   K 3.5 01/22/2016 1115   K 3.8 01/21/2016 0001   CL 107 01/21/2016 0001   CO2 25 01/22/2016 1115   CO2 23 01/21/2016 0001   BUN 16.7 01/22/2016 1115   BUN 13 01/21/2016 0001   CREATININE 1.4* 01/22/2016 1115   CREATININE 1.02 01/21/2016 0001   CREATININE 1.10 09/30/2011 1453      Component Value Date/Time   CALCIUM 9.3 01/22/2016 1115   CALCIUM 9.0 01/21/2016 0001   ALKPHOS 1,080* 01/22/2016 1115   ALKPHOS 901* 01/21/2016 0001   AST 37* 01/22/2016 1115   AST 28 01/21/2016 0001   ALT 13 01/22/2016 1115   ALT 13 01/21/2016 0001   BILITOT 0.63 01/22/2016 1115   BILITOT 0.6 01/21/2016 0001      IMPRESSION: 1. Large amount of dural tumor over the left cerebral convexity with subcentimeter lesions in the left frontal lobe and cerebellum, consistent with metastatic disease. Involvement of the overlying skull as well as a short segment of the superior sagittal sinus. 2. 12 mm rightward midline  shift and mild left cerebral edema.  These results were called by telephone at the time of interpretation on 01/22/2016 at 10:15 am [facility] [facility] [place] [facility] who verbally acknowledged these results.  Results for BRICYN, Church (MRN [id number]) as of 01/22/2016 12:12  Ref. Range 12/08/2015 07:46 12/08/2015 07:46  PSA Latest Ref Range: 0.0-4.0 ng/mL 438.00 (H) 470.3 (H)     Impression and Plan:  65 year old gentleman with the following issues:  1. Advanced prostate cancer presenting with diffuse bony metastasis and bulky lymphadenopathy and PSA of 899.8. This is biopsy proven to be prostate cancer.   He is currently on Lupron started in June 2016 and have tolerated it well.   His PSA have cut down dramatically from 899 to 89 initially. His PSA started to rise in October 2016 and now is up to 470. Risks and benefits of Zytiga more reviewed and he is agreeable to proceed with this medication. We'll start with 1000 mg on an empty stomach. The benefit from this medication would be merely to palliate his cancer symptoms bowing forward. He understands that these are not curative options.   2. Bone directed therapy:  He received Xgeva without any major complications. Potential complications were reviewed again including  hypocalcemia, osteonecrosis of the jaw and injection-related problems. He is on calcium supplements as well as vitamin D and ready to proceed.  3. Dural/epidural mass noted over the frontal lobe with moderate regional mass effect noted on an MRI on 01/22/2016. This explains his slurred speech and facial droop. Most likely this represents metastatic prostate cancer to this area and certainly will need intervention. Given edema noted around this area of metastasis he will benefit from dexamethasone. I started at 4 mg 3 times a day with food to help alleviate some of the neurological symptoms. I will also refer him to radiation oncology for palliative radiation therapy in the near  future.  4. Pain: Seems to be reasonably controlled with mild analgesia. No changes noted.  5. Prognosis: His prognosis is rather poor given the aggressive nature of his cancer and side of metastasis. He still have reasonable performance status and would likely benefit from palliative radiation therapy as well as palliative systemic therapy.  6. Follow-up: Will be in 4 weeks for a repeat PSA and repeat Xgeva injection.     [facility], MD 4/7/201712:09 PM

## 2016-01-22 NOTE — Progress Notes (Addendum)
Location/Histology of Brain Tumor: Large amount of dural tumor over the left cerebral convexity with ubcentimeter lesions in the left frontal lobe and cerebellum, onsistent with metastatic disease. Involvement of the overlying skull as well as a short segment of the superior sagittal sinus.  Patient presented with symptoms of:  Presented to PCP with dizziness. PCP adjusted bp medications then, speech difficulties, facial drooping, and difficult word finding developed.   Past or anticipated interventions, if any, per neurosurgery: no  Past or anticipated interventions, if any, per medical oncology:  Prior Therapy: He is status post a biopsy obtained of the soft tissue in the retroperitoneal area on 04/03/2015.  Current therapy:  Lupron 22.5 mg IM every 3 months started in June 2016. Casodex given for 1 month and discontinued in August 2016. Xgeva to start on 07/24/2015.  He is under consideration to start Zytiga although not received yet.   Dose of Decadron, if applicable: Decadron 4 mg tid  Recent neurologic symptoms, if any:   Seizures: no  Headaches: no  Nausea: reports one episode of nausea and vomiting a small amount with scant blood  Dizziness/ataxia: resolved  Difficulty with hand coordination: reports one episode of right hand tremors when he was attempting to unlock the       door at his home  Focal numbness/weakness: no  Visual deficits/changes: no  Confusion/Memory deficits: no  Painful bone metastases at present, if any: no  SAFETY ISSUES:  Prior radiation? no  Pacemaker/ICD? no  Possible current pregnancy? no  Is the patient on methotrexate? no  Additional Complaints / other details: 65 year old male. Works in Forensic psychologist for [employer]. Difficulty word finding noted. Left eye ptosis noted.

## 2016-01-23 LAB — PSA (PARALLEL TESTING): PSA: 546.7 ng/mL — AB (ref <=4.00)

## 2016-01-23 LAB — PSA: PROSTATE SPECIFIC AG, SERUM: 702.5 ng/mL — AB (ref 0.0–4.0)

## 2016-01-25 ENCOUNTER — Ambulatory Visit (HOSPITAL_BASED_OUTPATIENT_CLINIC_OR_DEPARTMENT_OTHER)
Admission: RE | Admit: 2016-01-25 | Discharge: 2016-01-25 | Disposition: A | Discharge status: Home / Self Care | Payer: Managed Care, Other (non HMO) | Source: Ambulatory Visit | Attending: Radiation Oncology | Admitting: Radiation Oncology

## 2016-01-25 ENCOUNTER — Encounter: Payer: Self-pay | Admitting: Medical Oncology

## 2016-01-25 ENCOUNTER — Telehealth: Payer: Self-pay | Admitting: Pharmacist

## 2016-01-25 ENCOUNTER — Ambulatory Visit
Admission: RE | Admit: 2016-01-25 | Discharge: 2016-01-25 | Disposition: A | Discharge status: Home / Self Care | Payer: Managed Care, Other (non HMO) | Source: Ambulatory Visit | Attending: Radiation Oncology | Admitting: Radiation Oncology

## 2016-01-25 ENCOUNTER — Encounter: Payer: Self-pay | Admitting: Radiation Oncology

## 2016-01-25 VITALS — BP 141/82 | HR 67 | Resp 16 | Wt 207.9 lb

## 2016-01-25 DIAGNOSIS — Z7189 Other specified counseling: Secondary | ICD-10-CM

## 2016-01-25 DIAGNOSIS — C7931 Secondary malignant neoplasm of brain: Secondary | ICD-10-CM

## 2016-01-25 DIAGNOSIS — Z87891 Personal history of nicotine dependence: Secondary | ICD-10-CM | POA: Diagnosis not present

## 2016-01-25 DIAGNOSIS — R4701 Aphasia: Secondary | ICD-10-CM

## 2016-01-25 DIAGNOSIS — Z510 Encounter for antineoplastic radiation therapy: Secondary | ICD-10-CM | POA: Diagnosis not present

## 2016-01-25 DIAGNOSIS — Z515 Encounter for palliative care: Secondary | ICD-10-CM | POA: Diagnosis not present

## 2016-01-25 DIAGNOSIS — Z808 Family history of malignant neoplasm of other organs or systems: Secondary | ICD-10-CM | POA: Diagnosis not present

## 2016-01-25 DIAGNOSIS — C61 Malignant neoplasm of prostate: Secondary | ICD-10-CM

## 2016-01-25 DIAGNOSIS — C7951 Secondary malignant neoplasm of bone: Secondary | ICD-10-CM

## 2016-01-25 HISTORY — DX: Malignant neoplasm of bone and articular cartilage, unspecified: C41.9

## 2016-01-25 HISTORY — DX: Malignant neoplasm of brain, unspecified: C71.9

## 2016-01-25 NOTE — Telephone Encounter (Signed)
01/25/16: Patient still with confusion on how to obtain Zytiga rx. Fabio Asa is ready for Delivery from [place] in [place]. Copay card is for $10. [facility] has this on file. Provided Mr. Valliant son, Torion, with this information through a voicemail as well as [facility] specialty phone numbers [phone] (Phone), [phone] (Fax))  Thank you,  Montel Clock, PharmD, [facility]

## 2016-01-25 NOTE — Progress Notes (Signed)
  Radiation Oncology         [phone]) [phone] ________________________________  Name: Steven Church MRN: [id number]  Date: 01/25/2016  DOB: 1951/07/11  SIMULATION AND TREATMENT PLANNING NOTE    ICD-9-CM ICD-10-CM   1. 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  Identity was confirmed.  All relevant records and images related to the planned course of therapy were reviewed.  The patient freely provided informed written consent to proceed with treatment after reviewing the details related to the planned course of therapy. The consent form was witnessed and verified by the simulation staff.  Then, the patient was set-up in a stable reproducible  supine position for radiation therapy.  CT images were obtained.  Surface markings were placed.  The CT images were loaded into the planning software.  Then the target and avoidance structures were contoured.  Treatment planning then occurred.  The radiation prescription was entered and confirmed.  Then, I designed and supervised the construction of a total of 3 medically necessary complex treatment devices, including a custom made thermoplastic mask used for immobilization and two complex multileaf collimators to cover the entire intracranial contents, while shielding the eyes and face.  Each [facility] is independently created to account for beam divergence.  The right and left lateral fields will be treated with 6 MV X-rays.  I have requested : Isodose Plan.    PLAN:  The whole brain will be treated to 30 Gy in 10 fractions.  ________________________________  Sheral Apley Tammi Klippel, M.D.  This document serves as a record of services personally performed by Tyler Pita, MD. It was created on his behalf by Arlyce Harman, a trained medical scribe. The  creation of this record is based on the scribe's personal observations and the provider's statements to them. This document has been checked and approved by the attending provider.

## 2016-01-25 NOTE — Progress Notes (Signed)
Oncology Nurse Navigator Documentation  Oncology Nurse Navigator Flowsheets 03/11/2015 03/12/2015 01/25/2016  Navigator Encounter Type Introductory phone call;Clinic/MDC Clinic/MDC Treatment- Steven Church and his son Steven Church here today for consult with Dr. Tammi Klippel for brain mets. Steven Church has not received his Zytiga and he was confused in where to get stating his pharmacy does not have it. I did some research and found that Steven Church has been trying to reach Steven Church without success. I gave Steven Church the number to call and he states he will call today.   Patient Visit Type - - RadOnc  Treatment Phase - - CT SIM  Barriers/Navigation Needs - Education Education  Education - - Pain/ Symptom Management;Preparing for Upcoming Surgery/ Treatment  Interventions - - Education Method  Education Method - - Teach-back;Verbal  Support Groups/Services - - Friends and Family  Acuity - - Level 1  Acuity Level 1 - - Initial guidance, education and coordination as needed  Time Spent with Patient - - 45  Time Spent with Patient (Retired) 15 15 -

## 2016-01-25 NOTE — Progress Notes (Signed)
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 Prostate cancer ([place]) [place] [facility] referral to Social Work  2. Brain metastasis ([place]) 198.3 C79.31     HISTORY OF PRESENT ILLNESS::Steven Church is a 65 y.o. male who was originally diagnosed with prostate cancer in June 2016. He began hormone therapy which decreased his PSA initially. His PSA has began to rise reaching a most recent value of 702 on April 7th. The patient has seen Dr. [name] who started him on Zytiga.   The patient began to experience speech changes, left eye drooping, and a difficulty finding specific words, as pointed out by co-workers, prompting him to see his PCP. He had a brain MRI.  This showed a 3 cm thick left frontal convexity mass compressing the brain with midline shift and at least 3 intraparenchymal brain mets.  He started taking decadron on 01/22/16.    PREVIOUS RADIATION THERAPY: No  PAST MEDICAL HISTORY:  has a past medical history of Hypertension; Obesity; Prostate cancer ([place]) (04/07/2015); Family history of cancer; Former smoker; Bone cancer ([place]); and Brain cancer ([place]).    PAST SURGICAL HISTORY: Past Surgical History  Procedure Laterality Date  . Colonoscopy      never    FAMILY HISTORY: family history includes Alcohol abuse in his brother; Cancer in his sister; Diabetes in his father; Hypertension in his brother and mother; Obesity in his sister; Other in his mother.  SOCIAL HISTORY:  Social History   Social History  . Marital Status: Divorced    Spouse Name: N/A  . Number of Children: N/A    . Years of Education: N/A   Occupational History  . Not on file.   Social History Main Topics  . Smoking status: Former Smoker -- 1.00 packs/day for 18 years  . Smokeless tobacco: Never Used  . Alcohol Use: No  . Drug Use: No  . Sexual Activity: Yes   Other Topics Concern  . 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  Exercise with walking    ALLERGIES: Losartan potassium-hctz  MEDICATIONS:  Current Outpatient Prescriptions  Medication Sig Dispense Refill  . Aspirin-Caffeine (BC FAST PAIN RELIEF PO) Take 1 Package by mouth as needed (pain). Reported on 01/21/2016    . calcium-vitamin D 250-100 MG-UNIT per tablet Take 1 tablet by mouth 2 (two) times daily. 60 tablet 3  . [facility] OYSTER SHELL CALCIUM+VIT D 500-125 MG-UNIT TABS Take 1 tablet by mouth 2 (two) times daily.  3  . dexamethasone (DECADRON) 4 MG tablet Take 1 tablet (4 mg total) by mouth 3 (three) times daily. 60 tablet 0  . finasteride (PROSCAR) 5 MG tablet Take 1 tablet (5 mg total) by mouth daily. 90 tablet 0  . fluticasone (FLONASE) 50 MCG/ACT nasal spray Place 2 sprays into both nostrils daily. Reported on 01/21/2016    . losartan (COZAAR) 100 MG tablet Take 1 tablet (100 mg total) by mouth daily. 30 tablet 3  . omeprazole (PRILOSEC) 40 MG capsule TAKE 1 CAPSULE (40 MG TOTAL)  BY MOUTH DAILY. 90 capsule 1  . tamsulosin (FLOMAX) 0.4 MG CAPS capsule TAKE ONE CAPSULE BY MOUTH EVERY DAY 30 capsule 1  . abiraterone Acetate (ZYTIGA) 250 MG tablet Take 4 tablets (1,000 mg total) by mouth daily. Take on an empty stomach 1 hour before or 2 hours after a meal (Patient not taking: Reported on 01/21/2016) 120 tablet 0  . losartan-hydrochlorothiazide (HYZAAR) 100-25 MG tablet Take 1 tablet by mouth daily. Reported on 01/25/2016  1   No current facility-administered medications for this encounter.    REVIEW OF SYSTEMS:  A 15 point review  of systems is documented in the electronic medical record. This was obtained by the nursing staff. However, I reviewed this with the patient to discuss relevant findings and make appropriate changes.  Pertinent items are noted in HPI.   65 year old male. He is accompanied by his son, Steven Church, today. Works in Forensic psychologist for [employer]. Current symptoms are affecting his ability to perform tasks at work. No family history of prostate cancer. Denies seizures, headaches, focal numbness/weakness, visual deficits/changes, and confusion/memory deficits. Reports one episode of nausea and vomiting a small amount with scant blood. Previous dizziness has resolved. Reports one episode of right hand tremors when he was attemping to the unlock the door this morning. Denies difficulty eating or drinking on the left side. States his only perception is difficulty speaking. Denies pain.   PHYSICAL EXAM:  weight is 207 lb 14.4 oz (94.303 kg). His blood pressure is 141/82 and his pulse is 67. His respiration is 16 and oxygen saturation is 100%.   He had some slow word finding and left eye droop.  Otherwise neuro intact.  KPS = 80  100 - Normal; no complaints; no evidence of disease. 90   - Able to carry on normal activity; minor signs or symptoms of disease. 80   - Normal activity with effort; some signs or symptoms of disease. 63   - Cares for self; unable to carry on normal activity or to do active work. 60   - Requires occasional assistance, but is able to care for most of his personal needs. 50   - Requires considerable assistance and frequent medical care. 31   - Disabled; requires special care and assistance. 67   - Severely disabled; hospital admission is indicated although death not imminent. 10   - Very sick; hospital admission necessary; active supportive treatment necessary. 10   - Moribund; fatal processes progressing rapidly. 0     - Dead  Karnofsky DA, Abelmann Butler, Craver LS  and Burchenal JH [phone]) The use of the nitrogen mustards in the palliative treatment of carcinoma: with particular reference to bronchogenic carcinoma Cancer 1 [phone]  LABORATORY DATA:  Lab Results  Component Value Date   WBC 6.6 01/22/2016   HGB 10.6* 01/22/2016   HCT 33.3* 01/22/2016   MCV 81.7 01/22/2016   PLT 232 01/22/2016   Lab Results  Component Value Date   NA 144 01/22/2016   K 3.5 01/22/2016   CL 107 01/21/2016   CO2 25 01/22/2016   Lab Results  Component Value Date   ALT 13 01/22/2016   AST 37* 01/22/2016   ALKPHOS 1,080* 01/22/2016   BILITOT 0.63 01/22/2016     RADIOGRAPHY: Mr Jeri Cos Wo Contrast  01/22/2016  CLINICAL DATA:  Speech disturbance. Left thigh ptosis. Facial weakness. Metastatic prostate cancer. EXAM: MRI HEAD WITHOUT AND WITH CONTRAST TECHNIQUE: Multiplanar, multiecho pulse sequences of the brain  and surrounding structures were obtained without and with intravenous contrast. CONTRAST:  66mL MULTIHANCE GADOBENATE DIMEGLUMINE 529 MG/ML IV SOLN COMPARISON:  No prior brain imaging available.  PET-CT 03/20/2015. FINDINGS: There is a large heterogeneously enhancing dural/epidural soft tissue mass along the left cerebral convexity. This measures up to 2.7 cm in thickness over the frontal lobe, and there is moderate regional mass effect on the frontal lobe with 1.2 cm of rightward midline shift. There is partial effacement of the left lateral without evidence of ventricular trapping. Nodular dural tumor anterior to the left temporal lobe in the middle cranial fossa measures 1.5 cm in thickness. Extending both anteriorly and posteriorly from the tumor is smooth dural thickening and enhancement over the parietal and temporal lobes as well as frontal lobe extending into the anterior interhemispheric fissure which may reflect a component of reactive dural thickening. Calvarial bone marrow signal overlying the dural tumor is diffusely abnormal, as is left sphenoid wing marrow  signal. Tumor extends slightly across the midline at the vertex with involvement of a short segment of the superior sagittal sinus. The sinus appears patent anterior and posterior to this. Susceptibility artifact associated with the tumor may reflect blood products or mineralization. There is mild edema in the underlying left frontal and anterior parietal lobes. A 6 mm round enhancing lesion is noted in the left anterior cranial fossa, and it is not completely clear whether this reflects an extra-axial lesion or superficial left frontal parenchymal lesion. Additionally, there is a 6 mm enhancing lesion in the superior cerebellar vermis. There is also likely a 3 mm enhancing lesion in the inferolateral aspect of the left cerebellum (series 19, image 9 and series 20, image 11). Patchy T2 hyperintensities in the periventricular greater than subcortical cerebral white matter bilaterally are nonspecific but compatible with mild chronic small vessel ischemic disease. Abnormal marrow signal is partially visualized in the upper cervical spine including a 6 mm T1 hypointense focus in the C2 vertebral body, compatible with diffuse osseous metastatic disease described on the prior PET-CT. Orbits are unremarkable. There is mild left frontal and left ethmoid sinus mucosal thickening. A small left mastoid effusion is present. IMPRESSION: 1. Large amount of dural tumor over the left cerebral convexity with subcentimeter lesions in the left frontal lobe and cerebellum, consistent with metastatic disease. Involvement of the overlying skull as well as a short segment of the superior sagittal sinus. 2. 12 mm rightward midline shift and mild left cerebral edema. These results were called by telephone at the time of interpretation on 01/22/2016 at 10:15 am toDAVID [facility], [place] , who verbally acknowledged these results. Electronically Signed   By: Logan Bores M.D.   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 The patient would be a good candidate for whole brain radiation.   PLAN: Today, I talked to the patient and family about the findings and work-up thus far.  We discussed the natural history of metastatic prostate cancer with brain metastases and general treatment, highlighting the role of radiotherapy in the management.  We discussed the available radiation techniques, and focused on the details of logistics and delivery.  We reviewed the anticipated acute and late sequelae associated with radiation in this setting.  The patient was encouraged to ask questions that I answered to the best of my ability.  I filled out a patient counseling form during  our discussion including treatment diagrams.  We retained a copy for our records.  The patient would like to proceed with radiation and has been scheduled for CT simulation today.   The patient spoke with Wadie Lessen NP of palliative care today.   I spent 60 minutes minutes face to face with the patient and more than 50% of that time was spent in counseling and/or coordination of care.     ------------------------------------------------  Sheral Apley. Tammi Klippel, M.D.      This document serves as a record of services personally performed by Tyler Pita, MD. It was created on his behalf by Arlyce Harman, a trained medical scribe. The creation of this record is based on the scribe's personal observations and the provider's statements to them. This document has been checked and approved by the attending provider.

## 2016-01-25 NOTE — Progress Notes (Signed)
See progress note under physician encounter. 

## 2016-01-25 NOTE — Consult Note (Signed)
Consultation Note Date: 01/25/2016   Patient Name: Steven Church  DOB: [birth year]  MRN: [id number]  Age / Sex: 65 y.o., male  PCP: Carlena Hurl, PA-C Referring Physician: Tyler Pita, MD  Reason for Consultation: Establishing goals of care and Psychosocial/spiritual support  Clinical Assessment/Narrative:  Principle Diagnosis: 65 year old gentleman with prostate cancer presented with diffuse bony metastasis and lymphadenopathy. His PSA is 899 with unknown Gleason score. His diagnosis was confirmed in June 2016.   Prior Therapy: He is status post a biopsy obtained of the soft tissue in the retroperitoneal area on 04/03/2015.  Current therapy:  Lupron 22.5 mg IM every 3 months started in June 2016. Casodex given for 1 month and discontinued in August 2016. Xgeva to start on 07/24/2015.  He is under consideration to start Zytiga although not received yet.  Interim History: Steven Church presents today for a follow-up visit. Since the last visit, he developed dizziness and was evaluated by his primary care provider and his blood pressure medication and been adjusted. He also developed speech difficulties/word finding  and facial droop and word finding difficulties. He had an MRI done on 01/22/2016 which showed a large amount of dural tumor of the left cerebellar convexity with subcentimeter lesions in the left frontal lobe. These findings are consistent with metastatic disease. Despite these findings, he feels relatively fair. He is able to ambulate without any major difficulties. Has not reported any headaches, blurry vision or syncope or seizures.  He is seen today in the radiation-oncology clinic to discuss possible initiation of and options for treatment    This NP Wadie Lessen reviewed medical records, received report from team, assessed the patient and then meet with Mr Cusano and his son [name]  in  the outpatient radiation- oncology clinic to introduce Palliative Medicine into his overall treatment plan, initiate documentation of Advanced directives   A  discussion was had today regarding advanced directives.   The difference between a aggressive medical intervention path  and a palliative comfort care path for this patient at this time was had.  Values and goals of care important to patient and family were attempted to be elicited.   Questions and concerns addressed. Family encouraged to call with questions or concerns.  PMT will continue to support holistically, will f/u with this patient next week while he is here for his treatments.    ECOG PERFORMANCE STATUS* (Eastern Cooperative Oncology Group)  0 Fully active, able to continue with all pre-disease activities without restriction. Pt score  1 Restricted in physically strenuous activity but ambulatory and able to carry out work of a light or sedentary nature, e.g., light house work, office work. 1  2 Ambulatory and capable of all self-care but unable to carry out any work activities. Up and about more than 50% of waking hours.    3 Capable of only limited self-care. Confined to bed or chair more than 50% of waking hours.   4 Completely disabled. Cannot carry on any self-care. Totally confined to bed or chair.   5 Dead.    As published in Am. J. Clin. Oncol.: Eustace Pen, M.M., Colon Flattery., [place], D.C., [place], Sharen Hint., Drexel Iha, P.P.: Toxicity And Response Criteria Of The [facility]. [place] [id number], 1982.  The ECOG Performance Status is in the public domain therefore available for public use. To duplicate the scale, please cite the reference above and credit the [facility] Group, Tyler Pita M.D., Group Chair  Primary Decision Maker: patient himself with support of his son   HCPOA: none documented, encouraged     SUMMARY OF RECOMMENDATIONS  Code  Status/Advance Care Planning: Full code Code Status History    Date Active Date Inactive Code Status Order ID Comments User Context   04/03/2015  9:47 AM 04/04/2015  3:18 AM Full Code [id number]  Corrie Mckusick, DO HOV      Other Directives:None  Symptom Management:    Aphagia:  We discussed attempting to utilize written language instead of oral communication, Mr Marotta feels this will be more effective  Psycho-social/Spiritual:   Support System: Fair  Additional Recommendations: Education on Hospice benefit for possible need in the future  Prognosis:   Poor long term prognosis, likely less than six months    Chief Complaint/ Primary Diagnoses: Present on Admission:  **None**  I have reviewed the medical record, interviewed the patient and family, and examined the patient. The following aspects are pertinent.  Past Medical History  Diagnosis Date  . Hypertension   . Obesity   . Prostate cancer ([place]) 04/07/2015    bone mets, started oncology 03/2015  . Family history of cancer   . Former smoker   . Bone cancer ([place])   . Brain cancer [facility])    Social History   Social History  . Marital Status: Divorced    Spouse Name: N/A  . Number of Children: N/A  . Years of Education: N/A   Social History Main Topics  . Smoking status: Former Smoker -- 1.00 packs/day for 18 years  . Smokeless tobacco: Never Used  . Alcohol Use: No  . Drug Use: No  . Sexual Activity: Yes   Other Topics Concern  . 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  Exercise with walking   Family History  Problem Relation Age of Onset  . Hypertension Mother   . Other Mother     died of old age  . Diabetes Father     died of diabetic infection complications  . Cancer Sister     lung  . Alcohol abuse Brother   . Obesity Sister   . Hypertension Brother    Scheduled Meds: Continuous  Infusions: PRN Meds:. Medications Prior to Admission:  Prior to Admission medications   Medication Sig Start Date End Date Taking? Authorizing Provider  abiraterone Acetate (ZYTIGA) 250 MG tablet Take 4 tablets (1,000 mg total) by mouth daily. Take on an empty stomach 1 hour before or 2 hours after a meal Patient not taking: Reported on 01/21/2016 12/21/15   [name], MD  Aspirin-Caffeine (BC FAST PAIN RELIEF PO) Take 1 Package by mouth as needed (pain). Reported on 01/21/2016    Historical Provider, MD  calcium-vitamin D 250-100 MG-UNIT per tablet Take 1 tablet by mouth 2 (two) times daily. 06/05/15   [name], MD  [facility] OYSTER SHELL CALCIUM+VIT D 500-125 MG-UNIT TABS Take 1 tablet by mouth 2 (two) times daily. 12/12/15   Historical Provider, MD  dexamethasone (DECADRON) 4 MG tablet Take 1 tablet (4 mg total) by mouth 3 (three) times daily. 01/22/16   [name], MD  finasteride (PROSCAR) 5 MG tablet Take 1 tablet (5 mg total) by mouth daily. 01/08/16   Camelia Eng Tysinger, PA-C  fluticasone (FLONASE) 50 MCG/ACT nasal spray Place 2 sprays into both nostrils daily. Reported on 01/21/2016    Historical  Provider, MD  losartan (COZAAR) 100 MG tablet Take 1 tablet (100 mg total) by mouth daily. 01/11/16   Camelia Eng Tysinger, PA-C  losartan-hydrochlorothiazide (HYZAAR) 100-25 MG tablet Take 1 tablet by mouth daily. Reported on 01/25/2016 11/06/15   Historical Provider, MD  omeprazole (PRILOSEC) 40 MG capsule TAKE 1 CAPSULE (40 MG TOTAL) BY MOUTH DAILY. 11/12/15   Camelia Eng Tysinger, PA-C  tamsulosin (FLOMAX) 0.4 MG CAPS capsule TAKE ONE CAPSULE BY MOUTH EVERY DAY 10/30/15   Carlena Hurl, PA-C   Allergies  Allergen Reactions  . Losartan Potassium-Hctz Other (See Comments)    dizziness    Review of Systems  Constitutional:       -main complaint is difficulty in word finding     Physical Exam  Constitutional: He is oriented to person, place, and time. He appears well-developed.  HENT:  Head:  Normocephalic.  Eyes:  - noted slight left sided eye and mouth droop  Neurological: He is alert and oriented to person, place, and time.  Skin: Skin is warm and dry.    Vital Signs: There were no vitals taken for this visit.  SpO2:   O2 Device:  O2 Flow Rate: .   IO: Intake/output summary: No intake or output data in the 24 hours ending 01/25/16 1439  LBM:   Baseline Weight:   Most recent weight:        Palliative Assessment/Data:    Additional Data Reviewed:  CBC:    Component Value Date/Time   WBC 6.6 01/22/2016 1115   WBC 5.3 01/21/2016 0001   HGB 10.6* 01/22/2016 1115   HGB 10.2* 01/21/2016 0001   HCT 33.3* 01/22/2016 1115   HCT 29.9* 01/21/2016 0001   PLT 232 01/22/2016 1115   PLT 254 01/21/2016 0001   MCV 81.7 01/22/2016 1115   MCV 79.7* 01/21/2016 0001   NEUTROABS 4.6 01/22/2016 1115   NEUTROABS 3180 01/21/2016 0001   LYMPHSABS 1.3 01/22/2016 1115   LYMPHSABS 1590 01/21/2016 0001   MONOABS 0.5 01/22/2016 1115   MONOABS 424 01/21/2016 0001   EOSABS 0.2 01/22/2016 1115   EOSABS 106 01/21/2016 0001   BASOSABS 0.0 01/22/2016 1115   BASOSABS 0 01/21/2016 0001   Comprehensive Metabolic Panel:    Component Value Date/Time   NA 144 01/22/2016 1115   NA 141 01/21/2016 0001   K 3.5 01/22/2016 1115   K 3.8 01/21/2016 0001   CL 107 01/21/2016 0001   CO2 25 01/22/2016 1115   CO2 23 01/21/2016 0001   BUN 16.7 01/22/2016 1115   BUN 13 01/21/2016 0001   CREATININE 1.4* 01/22/2016 1115   CREATININE 1.02 01/21/2016 0001   CREATININE 1.10 09/30/2011 1453   GLUCOSE 100 01/22/2016 1115   GLUCOSE 101* 01/21/2016 0001   CALCIUM 9.3 01/22/2016 1115   CALCIUM 9.0 01/21/2016 0001   AST 37* 01/22/2016 1115   AST 28 01/21/2016 0001   ALT 13 01/22/2016 1115   ALT 13 01/21/2016 0001   ALKPHOS 1,080* 01/22/2016 1115   ALKPHOS 901* 01/21/2016 0001   BILITOT 0.63 01/22/2016 1115   BILITOT 0.6 01/21/2016 0001   PROT 7.8 01/22/2016 1115   PROT 7.1 01/21/2016 0001    ALBUMIN 3.2* 01/22/2016 1115   ALBUMIN 3.6 01/21/2016 0001   Discussed with Worthy Flank PA-C and Joaquim Lai RN  Time In: 0800 Time Out: 0900 Time Total: 60  min Greater than 50%  of this time was spent counseling and coordinating care related to the above assessment and plan.  Signed by: Wadie Lessen, NP  Knox Royalty, NP  01/25/2016, 2:39 PM  Please contact Palliative Medicine Team phone at [phone] for questions and concerns.

## 2016-01-26 DIAGNOSIS — C61 Malignant neoplasm of prostate: Secondary | ICD-10-CM | POA: Insufficient documentation

## 2016-01-26 DIAGNOSIS — Z7189 Other specified counseling: Secondary | ICD-10-CM | POA: Insufficient documentation

## 2016-01-26 DIAGNOSIS — C7951 Secondary malignant neoplasm of bone: Secondary | ICD-10-CM

## 2016-01-26 DIAGNOSIS — R4701 Aphasia: Secondary | ICD-10-CM | POA: Insufficient documentation

## 2016-01-26 DIAGNOSIS — Z515 Encounter for palliative care: Secondary | ICD-10-CM | POA: Insufficient documentation

## 2016-01-28 ENCOUNTER — Encounter: Payer: Self-pay | Admitting: Radiation Oncology

## 2016-01-28 NOTE — Progress Notes (Signed)
Completed FMLA paperwork and placed in Steven Church' inbox to review and sign.

## 2016-02-01 ENCOUNTER — Ambulatory Visit
Admission: RE | Admit: 2016-02-01 | Discharge: 2016-02-01 | Disposition: A | Discharge status: Home / Self Care | Payer: Managed Care, Other (non HMO) | Source: Ambulatory Visit | Attending: Radiation Oncology | Admitting: Radiation Oncology

## 2016-02-01 DIAGNOSIS — Z510 Encounter for antineoplastic radiation therapy: Secondary | ICD-10-CM | POA: Diagnosis not present

## 2016-02-02 ENCOUNTER — Telehealth: Payer: Self-pay | Admitting: Radiation Oncology

## 2016-02-02 ENCOUNTER — Ambulatory Visit
Admission: RE | Admit: 2016-02-02 | Discharge: 2016-02-02 | Disposition: A | Discharge status: Home / Self Care | Payer: Managed Care, Other (non HMO) | Source: Ambulatory Visit | Attending: Radiation Oncology | Admitting: Radiation Oncology

## 2016-02-02 DIAGNOSIS — Z510 Encounter for antineoplastic radiation therapy: Secondary | ICD-10-CM | POA: Diagnosis not present

## 2016-02-02 NOTE — Progress Notes (Signed)
02/02/2016 at 1600 Patient and his son picked up red folder of FMLA and short term disability paperwork. Patient understands he should review the paperwork and sign before submission to his HR department.

## 2016-02-02 NOTE — Telephone Encounter (Signed)
Phoned patient's son, Clare Gandy. Informed him that his father's FMLA and short term disability paperwork is ready for pick up. Ronalee Belts expresses his intention to pick these up on Monday. Appreciation for the call verbalized.

## 2016-02-03 ENCOUNTER — Encounter: Payer: Self-pay | Admitting: Medical Oncology

## 2016-02-03 ENCOUNTER — Ambulatory Visit
Admission: RE | Admit: 2016-02-03 | Discharge: 2016-02-03 | Disposition: A | Discharge status: Home / Self Care | Payer: Managed Care, Other (non HMO) | Source: Ambulatory Visit | Attending: Radiation Oncology | Admitting: Radiation Oncology

## 2016-02-03 DIAGNOSIS — Z510 Encounter for antineoplastic radiation therapy: Secondary | ICD-10-CM | POA: Diagnosis not present

## 2016-02-04 ENCOUNTER — Encounter: Payer: Self-pay | Admitting: Interventional Cardiology

## 2016-02-04 ENCOUNTER — Ambulatory Visit
Admission: RE | Admit: 2016-02-04 | Discharge: 2016-02-04 | Disposition: A | Discharge status: Home / Self Care | Payer: Managed Care, Other (non HMO) | Source: Ambulatory Visit | Attending: Radiation Oncology | Admitting: Radiation Oncology

## 2016-02-04 DIAGNOSIS — Z510 Encounter for antineoplastic radiation therapy: Secondary | ICD-10-CM | POA: Diagnosis not present

## 2016-02-05 ENCOUNTER — Encounter: Payer: Self-pay | Admitting: Radiation Oncology

## 2016-02-05 ENCOUNTER — Ambulatory Visit
Admission: RE | Admit: 2016-02-05 | Discharge: 2016-02-05 | Disposition: A | Discharge status: Home / Self Care | Payer: Managed Care, Other (non HMO) | Source: Ambulatory Visit | Attending: Radiation Oncology | Admitting: Radiation Oncology

## 2016-02-05 VITALS — BP 134/73 | HR 64 | Resp 16 | Wt 221.2 lb

## 2016-02-05 DIAGNOSIS — C7931 Secondary malignant neoplasm of brain: Secondary | ICD-10-CM

## 2016-02-05 DIAGNOSIS — C7951 Secondary malignant neoplasm of bone: Secondary | ICD-10-CM

## 2016-02-05 DIAGNOSIS — C61 Malignant neoplasm of prostate: Secondary | ICD-10-CM

## 2016-02-05 DIAGNOSIS — Z510 Encounter for antineoplastic radiation therapy: Secondary | ICD-10-CM | POA: Diagnosis not present

## 2016-02-05 MED ORDER — FLUCONAZOLE 100 MG PO TABS: 100.0000 mg | ORAL_TABLET | Freq: Every day | ORAL | Status: DC

## 2016-02-05 NOTE — Progress Notes (Signed)
  Radiation Oncology         [phone]   Name: Steven Church MRN: [id number]   Date: 02/05/2016  DOB: 1950/12/27     Weekly Radiation Therapy Management    ICD-9-CM ICD-10-CM   1. Prostate cancer ([place]) 185 C61   2. Brain metastasis ([facility]) 198.3 C79.31   3. Prostate cancer metastatic to bone ([facility]) 185 C61    198.5 C79.51     Current Dose: 15 Gy  Planned Dose:  30 Gy  Narrative The patient presents for routine under treatment assessment.  Weight gain noted. States, "I have a big appetite." Vitals stable. Denies pain. Reports when he closes his left eye he can see clearly from his left but, with both eyes "things are blurry. Reports taking decadron 4 mg tid as directed. Patient has THRUSH. Reports taking Zytiga as directed as well. Denies headache, nausea, vomiting, diplopia or ringing in his ear. Reports he is "finding his words better." Unsteady gait intermittently continues.    The patient is without complaint. Set-up films were reviewed. The chart was checked.  Physical Findings  weight is 221 lb 3.2 oz (100.336 kg). His blood pressure is 134/73 and his pulse is 64. His respiration is 16 and oxygen saturation is 99%. . Weight essentially stable.  No significant changes. Thrush noted.  Impression The patient is tolerating radiation.  Plan Continue treatment as planned. I have prescribed the patient diflucan for thrush. The patient will begin to taper his decadron to 2 times daily, previously taking 3 times daily.          Sheral Apley Tammi Klippel, M.D.  This document serves as a record of services personally performed by Tyler Pita, MD. It was created on his behalf by Arlyce Harman, a trained medical scribe. The creation of this record is based on the scribe's personal observations and the provider's statements to them. This document has been checked and approved by the attending provider.

## 2016-02-05 NOTE — Progress Notes (Signed)
Weight gain noted. States, "I have a big appetite." Vitals stable. Denies pain. Reports when he closes his left eye he can see clearly from his left but, with both eyes "things are blurry. Reports taking decadron 4 mg tid as directed. Patient has THRUSH. Reports taking Zytiga as directed as well. Denies headache, nausea, vomiting, diplopia or ringing in his ear. Reports he is "finding his words better." Unsteady gait intermittently continues.   BP 134/73 mmHg  Pulse 64  Resp 16  Wt 221 lb 3.2 oz (100.336 kg)  SpO2 99% Wt Readings from Last 3 Encounters:  02/05/16 221 lb 3.2 oz (100.336 kg)  01/25/16 207 lb 14.4 oz (94.303 kg)  01/22/16 210 lb 14.4 oz (95.664 kg)

## 2016-02-05 NOTE — Progress Notes (Signed)
Oncology Nurse Navigator Documentation  Oncology Nurse Navigator Flowsheets 03/12/2015 01/25/2016 02/03/2016  Navigator Encounter Type Clinic/MDC Treatment Treatment  Treatment Initiated Date - - 01/25/2016  Patient Visit Type - RadOnc RadOnc  Treatment Phase - CT SIM Treatment- [name] states that his first radiation treatment went well. I noted his speech is clearer and his thought process has improved. He states since he started taking the "pills" his symptoms have improved. I will continue to follow and asked him to call me with any questions or concerns.  Barriers/Navigation Needs Education Education -  Education - Pain/ Symptom Management;Preparing for Biomedical scientist Treatment -  Interventions - Education Method -  Education Method - Teach-back;Verbal -  Support Groups/Services - Friends and Family -  Acuity - Level 1 -  Acuity Level 1 - Initial guidance, education and coordination as needed -  Time Spent with Patient - 45 15  Time Spent with Patient (Retired) 15 - -

## 2016-02-08 ENCOUNTER — Ambulatory Visit
Admission: RE | Admit: 2016-02-08 | Discharge: 2016-02-08 | Disposition: A | Discharge status: Home / Self Care | Payer: Managed Care, Other (non HMO) | Source: Ambulatory Visit | Attending: Radiation Oncology | Admitting: Radiation Oncology

## 2016-02-08 ENCOUNTER — Encounter: Payer: Self-pay | Admitting: Medical Oncology

## 2016-02-08 ENCOUNTER — Encounter: Payer: Self-pay | Admitting: Oncology

## 2016-02-08 DIAGNOSIS — Z510 Encounter for antineoplastic radiation therapy: Secondary | ICD-10-CM | POA: Diagnosis not present

## 2016-02-08 NOTE — Progress Notes (Signed)
Left for dr.to sign.

## 2016-02-09 ENCOUNTER — Ambulatory Visit
Admission: RE | Admit: 2016-02-09 | Discharge: 2016-02-09 | Disposition: A | Discharge status: Home / Self Care | Payer: Managed Care, Other (non HMO) | Source: Ambulatory Visit | Attending: Radiation Oncology | Admitting: Radiation Oncology

## 2016-02-09 DIAGNOSIS — Z510 Encounter for antineoplastic radiation therapy: Secondary | ICD-10-CM | POA: Diagnosis not present

## 2016-02-10 ENCOUNTER — Encounter: Payer: Self-pay | Admitting: Radiation Oncology

## 2016-02-10 ENCOUNTER — Encounter: Payer: Self-pay | Admitting: Oncology

## 2016-02-10 ENCOUNTER — Ambulatory Visit
Admission: RE | Admit: 2016-02-10 | Discharge: 2016-02-10 | Disposition: A | Discharge status: Home / Self Care | Payer: Managed Care, Other (non HMO) | Source: Ambulatory Visit | Attending: Radiation Oncology | Admitting: Radiation Oncology

## 2016-02-10 ENCOUNTER — Telehealth: Payer: Self-pay | Admitting: Radiation Oncology

## 2016-02-10 DIAGNOSIS — Z510 Encounter for antineoplastic radiation therapy: Secondary | ICD-10-CM | POA: Diagnosis not present

## 2016-02-10 NOTE — Telephone Encounter (Addendum)
Hand delivered complete and signed [employer] short term disability paperwork to Verdie Drown to scan into the system. Handed patient original paperwork to take to his employer.

## 2016-02-10 NOTE — Progress Notes (Signed)
Paperwork ([employer]) received 4/26, faxed to attention Marge @ [phone] confirmation received, copy given to patient 4/26 Ardeen Fillers)

## 2016-02-10 NOTE — Progress Notes (Signed)
Faxed [employer] [phone] and sent to medical records form/notes

## 2016-02-11 ENCOUNTER — Ambulatory Visit
Admission: RE | Admit: 2016-02-11 | Discharge: 2016-02-11 | Disposition: A | Discharge status: Home / Self Care | Payer: Managed Care, Other (non HMO) | Source: Ambulatory Visit | Attending: Radiation Oncology | Admitting: Radiation Oncology

## 2016-02-11 ENCOUNTER — Encounter: Payer: Self-pay | Admitting: Radiation Oncology

## 2016-02-11 VITALS — BP 143/86 | HR 76 | Resp 16 | Wt 223.8 lb

## 2016-02-11 DIAGNOSIS — Z510 Encounter for antineoplastic radiation therapy: Secondary | ICD-10-CM | POA: Diagnosis not present

## 2016-02-11 DIAGNOSIS — C61 Malignant neoplasm of prostate: Secondary | ICD-10-CM

## 2016-02-11 MED ORDER — DEXAMETHASONE 4 MG PO TABS: 2.0000 mg | ORAL_TABLET | Freq: Two times a day (BID) | ORAL | Status: DC

## 2016-02-11 NOTE — Progress Notes (Signed)
  Radiation Oncology         [phone]   Name: Steven Church MRN: [id number]   Date: 02/11/2016  DOB: June 02, 1951     Weekly Radiation Therapy Management    ICD-9-CM ICD-10-CM   1. Prostate cancer ([place]) 185 C61     Current Dose: 27 Gy  Planned Dose:  30 Gy  Narrative The patient presents for routine under treatment assessment.  Weight and vitals stable. Denies pain. Reports taking decadron 4 mg bid as directed. No evidence of thrush noted. Denies headache, dizziness, nausea, vomiting, diplopia or ringing in the ears. Reports word finding and speech have improved. States, "I feel like I am moving around like an old man because I am not used to sitting around."  The patient is without complaint. Set-up films were reviewed. The chart was checked.  Physical Findings  weight is 223 lb 12.8 oz (101.515 kg). His blood pressure is 143/86 and his pulse is 76. His respiration is 16 and oxygen saturation is 100%. . Weight essentially stable.  No significant changes.  Impression The patient is tolerating radiation.  Plan Continue treatment as planned. I gave him instructions on how to taper his Decadron and he will start the taper tomorrow.         Sheral Apley Tammi Klippel, M.D.    This document serves as a record of services personally performed by Tyler Pita, MD. It was created on his behalf by Lendon Collar, a trained medical scribe. The creation of this record is based on the scribe's personal observations and the provider's statements to them. This document has been checked and approved by the attending provider.

## 2016-02-11 NOTE — Progress Notes (Signed)
Weight and vitals stable. Denies pain. Reports taking decadron 4 mg bid as directed. No evidence of thrush noted. Denies headache, dizziness, nausea, vomiting, diplopia or ringing in the ears. Reports word finding and speech have improved. States, "I feel like I am moving around like an old man because I am not used to sitting around."   BP 143/86 mmHg  Pulse 76  Resp 16  Wt 223 lb 12.8 oz (101.515 kg)  SpO2 100% Wt Readings from Last 3 Encounters:  02/11/16 223 lb 12.8 oz (101.515 kg)  02/05/16 221 lb 3.2 oz (100.336 kg)  01/25/16 207 lb 14.4 oz (94.303 kg)

## 2016-02-11 NOTE — Progress Notes (Signed)
Oncology Nurse Navigator Documentation  Oncology Nurse Navigator Flowsheets 01/25/2016 02/03/2016 02/08/2016  Navigator Encounter Type Treatment Treatment Treatment  Treatment Initiated Date - 01/25/2016 -  Patient Visit Type RadOnc RadOnc -  Treatment Phase CT SIM Treatment Treatment  Barriers/Navigation Needs Education - -  Education Pain/ Symptom Management;Preparing for Upcoming Surgery/ Treatment - Other- Steven Church states he is doing better since he started taking the decadron and getting the radiation. His speech is much clearer and he states he say what he is thinking. We discussed the importance of continuing taking the decadron even though he is feeling better. I stressed to Steven Church even though he is feeling better he should not be driving. He voiced understanding.  Interventions Education Method - Education Method  Education Method Teach-back;Verbal - Teach-back;Verbal  Support Groups/Services Friends and Family - Friends and Family  Acuity Level 1 - -  Acuity Level 1 Initial guidance, education and coordination as needed - -  Time Spent with Patient 45 15 15  Time Spent with Patient (Retired) - - -

## 2016-02-12 ENCOUNTER — Other Ambulatory Visit: Payer: Self-pay | Admitting: Radiation Oncology

## 2016-02-12 ENCOUNTER — Telehealth: Payer: Self-pay | Admitting: *Deleted

## 2016-02-12 ENCOUNTER — Encounter: Payer: Self-pay | Admitting: Radiation Oncology

## 2016-02-12 ENCOUNTER — Ambulatory Visit
Admission: RE | Admit: 2016-02-12 | Discharge: 2016-02-12 | Disposition: A | Discharge status: Home / Self Care | Payer: Managed Care, Other (non HMO) | Source: Ambulatory Visit | Attending: Radiation Oncology | Admitting: Radiation Oncology

## 2016-02-12 DIAGNOSIS — C7951 Secondary malignant neoplasm of bone: Secondary | ICD-10-CM

## 2016-02-12 DIAGNOSIS — C7931 Secondary malignant neoplasm of brain: Secondary | ICD-10-CM

## 2016-02-12 DIAGNOSIS — Z510 Encounter for antineoplastic radiation therapy: Secondary | ICD-10-CM | POA: Diagnosis not present

## 2016-02-12 DIAGNOSIS — C61 Malignant neoplasm of prostate: Secondary | ICD-10-CM

## 2016-02-12 NOTE — Progress Notes (Signed)
The following decadron taper instructions were given to the patient by Dr. Tammi Klippel on 02/11/16.  Half pill (2 mg) twice daily for two weeks then, half pill (2 mg) once daily for two weeks then, STOP.

## 2016-02-12 NOTE — Telephone Encounter (Signed)
Called patient to inform of speech pathology appt. On  May 5 - arrival time - 3:15 pm @ [facility], spoke with patient and he is aware of this appt.

## 2016-02-12 NOTE — Addendum Note (Signed)
Encounter addended by: Heywood Footman, RN on: 02/12/2016  1:54 PM<BR>     Documentation filed: Notes Section, Medications

## 2016-02-15 ENCOUNTER — Other Ambulatory Visit: Payer: Self-pay | Admitting: Oncology

## 2016-02-17 ENCOUNTER — Encounter: Payer: Self-pay | Admitting: *Deleted

## 2016-02-17 NOTE — Progress Notes (Signed)
[facility] Psychosocial Distress Screening Clinical Social Work  Clinical Social Work was referred by distress screening protocol.  The patient scored a 5 on the Psychosocial Distress Thermometer which indicates moderate distress. Clinical Social Worker attempted to contact patient by phone to assess for distress and other psychosocial needs. CSW has met patient previously in multidisciplinary clinic.  CSW left voicemail requesting patient return call.  ONCBCN DISTRESS SCREENING 01/25/2016  Screening Type Initial Screening  Distress experienced in past week (1-10) 5  Practical problem type Work/school  Emotional problem type Adjusting to illness  Information Concerns Type Lack of info about treatment;Lack of info about diagnosis  Physician notified of physical symptoms Yes  Referral to clinical social work Yes    Polo Riley, MSW, LCSW, OSW-C Clinical Social Worker [place] [phone]

## 2016-02-18 ENCOUNTER — Telehealth: Payer: Self-pay | Admitting: *Deleted

## 2016-02-18 NOTE — Telephone Encounter (Signed)
CALLED PATIENT TO ALTER FU APPT. ON 03-22-16 DUE TO ALISON PERKINS BEING ON VACATION, FU APPT. RESCHEDULED FOR 03-29-16 @ 2:15 PM WITH ALISON PERKINS, SPOKE WITH PATIENT AND HE AGREED TO NEW APPT. DAY AND TIME

## 2016-02-19 ENCOUNTER — Ambulatory Visit: Payer: Managed Care, Other (non HMO) | Attending: Radiation Oncology

## 2016-02-19 ENCOUNTER — Ambulatory Visit (HOSPITAL_BASED_OUTPATIENT_CLINIC_OR_DEPARTMENT_OTHER): Payer: Managed Care, Other (non HMO) | Admitting: Oncology

## 2016-02-19 ENCOUNTER — Other Ambulatory Visit (HOSPITAL_BASED_OUTPATIENT_CLINIC_OR_DEPARTMENT_OTHER): Payer: Managed Care, Other (non HMO)

## 2016-02-19 ENCOUNTER — Telehealth: Payer: Self-pay | Admitting: Oncology

## 2016-02-19 VITALS — BP 125/71 | HR 88 | Temp 98.4°F | Resp 18 | Ht 70.0 in | Wt 218.2 lb

## 2016-02-19 DIAGNOSIS — R4701 Aphasia: Secondary | ICD-10-CM | POA: Insufficient documentation

## 2016-02-19 DIAGNOSIS — R482 Apraxia: Secondary | ICD-10-CM | POA: Insufficient documentation

## 2016-02-19 DIAGNOSIS — C7951 Secondary malignant neoplasm of bone: Secondary | ICD-10-CM

## 2016-02-19 DIAGNOSIS — G939 Disorder of brain, unspecified: Secondary | ICD-10-CM

## 2016-02-19 DIAGNOSIS — C61 Malignant neoplasm of prostate: Secondary | ICD-10-CM

## 2016-02-19 LAB — CBC WITH DIFFERENTIAL/PLATELET
BASO%: 0.2 % (ref 0.0–2.0)
BASOS ABS: 0 10*3/uL (ref 0.0–0.1)
EOS%: 0.2 % (ref 0.0–7.0)
Eosinophils Absolute: 0 10*3/uL (ref 0.0–0.5)
HEMATOCRIT: 32.6 % — AB (ref 38.4–49.9)
HEMOGLOBIN: 10.4 g/dL — AB (ref 13.0–17.1)
LYMPH%: 14.9 % (ref 14.0–49.0)
MCH: 27.2 pg (ref 27.2–33.4)
MCHC: 32 g/dL (ref 32.0–36.0)
MCV: 84.9 fL (ref 79.3–98.0)
MONO#: 0.5 10*3/uL (ref 0.1–0.9)
MONO%: 8 % (ref 0.0–14.0)
NEUT%: 76.7 % — ABNORMAL HIGH (ref 39.0–75.0)
NEUTROS ABS: 5 10*3/uL (ref 1.5–6.5)
PLATELETS: 80 10*3/uL — AB (ref 140–400)
RBC: 3.84 10*6/uL — ABNORMAL LOW (ref 4.20–5.82)
RDW: 20.8 % — AB (ref 11.0–14.6)
WBC: 6.5 10*3/uL (ref 4.0–10.3)
lymph#: 1 10*3/uL (ref 0.9–3.3)

## 2016-02-19 LAB — COMPREHENSIVE METABOLIC PANEL
ALT: 22 U/L (ref 0–55)
AST: 16 U/L (ref 5–34)
Albumin: 2.9 g/dL — ABNORMAL LOW (ref 3.5–5.0)
Anion Gap: 7 mEq/L (ref 3–11)
BUN: 18 mg/dL (ref 7.0–26.0)
CHLORIDE: 112 meq/L — AB (ref 98–109)
CO2: 23 mEq/L (ref 22–29)
Calcium: 8.5 mg/dL (ref 8.4–10.4)
Creatinine: 0.9 mg/dL (ref 0.7–1.3)
GLUCOSE: 97 mg/dL (ref 70–140)
POTASSIUM: 4 meq/L (ref 3.5–5.1)
SODIUM: 142 meq/L (ref 136–145)
Total Bilirubin: 0.63 mg/dL (ref 0.20–1.20)
Total Protein: 5.9 g/dL — ABNORMAL LOW (ref 6.4–8.3)

## 2016-02-19 NOTE — Therapy (Signed)
[facility] [address] [place], [place], [address] Phone: [phone]   Fax:  [phone]  Speech Language Pathology Evaluation  Patient Details  Name: Steven Church MRN: [id number] Date of Birth: 14-Feb-1951 Referring Provider: Worthy Flank, NP  Encounter Date: 02/19/2016      End of Session - 02/19/16 1652    Visit Number 1   Number of Visits 16   Date for SLP Re-Evaluation 05/13/16   Authorization Type [employer]   SLP Start Time 1533   SLP Stop Time  1615   SLP Time Calculation (min) 42 min   Activity Tolerance Patient tolerated treatment well      Past Medical History  Diagnosis Date  . Hypertension   . Obesity   . Prostate cancer ([place]) 04/07/2015    bone mets, started oncology 03/2015  . Family history of cancer   . Former smoker   . Bone cancer ([place])   . Brain cancer [facility])     Past Surgical History  Procedure Laterality Date  . Colonoscopy      never    There were no vitals filed for this visit.      Subjective Assessment - 02/19/16 1541    Subjective Pt with mild hesitations, pauses in spontaneous speech. Last radiation therapy appt was last week.   Currently in Pain? 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   Prior Functional Status   Cognitive/Linguistic Baseline Within functional limits   Cognition   Overall Cognitive Status Within Functional Limits for tasks assessed   Auditory Comprehension   Overall Auditory Comprehension Appears within functional limits for tasks assessed   Verbal Expression   Overall Verbal Expression Impaired   Initiation No impairment   Repetition Impaired  >3 syllable words   Level of Impairment Word level   Naming Impairment   Responsive 76-100% accurate   100%   Confrontation 50-74% accurate   Divergent 75-100% accurate  75% iwth 10 items simple category   Other Verbal Expression Comments Pt with fast rate of speech hindering overall speech fluidity. Language in reading resulted in intermittent rare difficulty with verbal expression.    Motor Speech   Overall Motor Speech Impaired  "Some words I can't come out with."   Motor Planning Impaired   Level of Impairment Phrase   Motor Speech Errors Aware;Groping for words;Consistent   Interfering Components --  expressive aphasia   Effective Techniques Slow rate                         SLP Education - 02/19/16 1651    Education provided Yes   Education Details compensations for verbal expression - reduced rate   Person(s) Educated Patient   Methods Explanation   Comprehension Verbalized understanding          SLP Short Term Goals - 02/19/16 1654    SLP SHORT TERM GOAL #1   Title pt will demo HEP for verbal expression with rare min A   Time 4   Period Weeks   Status New   SLP SHORT TERM GOAL #2   Title  pt will generate WNL speech in 8 minute simple conversation over two sessions  Time 4   Period Weeks   Status New   SLP SHORT TERM GOAL #3   Title pt will complete mod complex naming tasks with 90% success and rare min A   Time 4   Period Weeks   Status New          SLP Long Term Goals - 02/19/16 1657    SLP LONG TERM GOAL #1   Title pt will complete HEP independently   Time 8   Period Weeks   Status New   SLP LONG TERM GOAL #2   Title pt will generate 8 minutes mod complex conversation with modified independence (slower rate, etc)   Time 8   Period Weeks   Status New          Plan - 02/19/16 1652    Clinical Impression Statement pt presents today with expressive aphasia with 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 Simple conversational speech is functional however pt expresses frustration over speech change and the negative stigma he feels. He  states he is not as confident with speaking, even among family. Skilled ST owuld benefit pt to improve his verbal expression skills and imrove his QOL.   Speech Therapy Frequency 2x / week   Duration --  8 weeks   Treatment/Interventions SLP instruction and feedback;Compensatory strategies;Patient/family education;Cueing hierarchy;Language facilitation  HEPs   Potential to Achieve Goals Fair   Potential Considerations Medical prognosis   Consulted and Agree with Plan of Care Patient      Patient will benefit from skilled therapeutic intervention in order to improve the following deficits and impairments:   Aphasia  Apraxia    Problem List Patient Active Problem List   Diagnosis Date Noted  . DNR (do not resuscitate) discussion   . Aphasia   . Prostate cancer metastatic to bone ([place])   . Palliative care encounter   . Brain metastasis ([place]) 01/25/2016  . Prostate cancer ([place]) 04/07/2015  . Retroperitoneal lymphadenopathy   . Mediastinal lymphadenopathy 03/12/2015  . Lung mass 03/09/2015  . Essential hypertension 07/11/2014  . Rhinitis, allergic 07/11/2014    [facility] ,MS, CCC-SLP   02/19/2016, 4:59 PM  [facility] [address] [place], [place], [address] Phone: [phone]   Fax:  [phone]  Name: Steven Church MRN: [id number] Date of Birth: 1951-01-14

## 2016-02-19 NOTE — Patient Instructions (Signed)
Slow your rate of speech down - this will help your message flow easier.

## 2016-02-19 NOTE — Telephone Encounter (Signed)
per pof to sch pt appt-gave pt copy of avs °

## 2016-02-19 NOTE — Progress Notes (Signed)
Hematology and Oncology Follow Up Visit  Steven Church [id number] 1951-10-01 65 y.o. 02/19/2016 12:58 PM   Principle Diagnosis: 65 year old gentleman with prostate cancer presented with diffuse bony metastasis and lymphadenopathy. His PSA is 899 with unknown Gleason score. His diagnosis was confirmed in June 2016.   Prior Therapy: He is status post a biopsy obtained of the soft tissue in the retroperitoneal area on 04/03/2015.  Current therapy:  Lupron 22.5 mg IM every 3 months started in June 2016.  Xgeva to start on 07/24/2015.  Zytiga 1000 mg started in April 2017.  Interim History:  Steven Church presents today for a follow-up visit. Since the last visit, he underwent palliative radiation therapy with whole brain radiation that was concluded in the last week of April. He tolerated therapy fairly well without any major symptoms. His appetite remained excellent and he is gaining weight. He is tapering dexamethasone slowly at this time as he started developing slight edema and facial swelling. His speech has improved slightly since the last visit. He denied any dizziness or syncope.  He started Zytiga as well which she has been taken in last month. He denied any specific complications such as weight loss, nausea or dyspepsia. He did report lower extremity swelling as mentioned which could be also related to steroids.   He does not report any fevers, chills, sweats, weight loss or appetite changes. He does not report any chest pain, palpitation, orthopnea or leg edema. He does not report any cough, shortness of breath, dyspnea on exertion, wheezing or hemoptysis. He does not report any nausea, vomiting, abdominal pain, hematochezia, or early satiety. He does not report any hematuria or dysuria. He does report frequency, hesitancy and nocturia. He does not report any skeletal complaints of arthralgias or myalgias. Remaining review of systems unremarkable.  Medications: I have reviewed the  patient's current medications.  Current Outpatient Prescriptions  Medication Sig Dispense Refill  . Aspirin-Caffeine (BC FAST PAIN RELIEF PO) Take 1 Package by mouth as needed (pain). Reported on 01/21/2016    . calcium-vitamin D 250-100 MG-UNIT per tablet Take 1 tablet by mouth 2 (two) times daily. 60 tablet 3  . [facility] OYSTER SHELL CALCIUM+VIT D 500-125 MG-UNIT TABS Take 1 tablet by mouth 2 (two) times daily.  3  . dexamethasone (DECADRON) 2 MG tablet Take 4 mg by mouth 2 (two) times daily with a meal. Patient given taper instructions on 02/11/16 by Steven Church. Take half pill (2 mg) twice daily for two weeks, then half pill (2 mg) once daily for two weeks, then STOP.    [facility] Kitchen dexamethasone (DECADRON) 4 MG tablet Take 0.5 tablets (2 mg total) by mouth 2 (two) times daily. Half pill (2 mg) twice daily for 2 weeks, then 2 mg once daily for 2 weeks then stop 60 tablet 0  . finasteride (PROSCAR) 5 MG tablet Take 1 tablet (5 mg total) by mouth daily. 90 tablet 0  . fluticasone (FLONASE) 50 MCG/ACT nasal spray Place 2 sprays into both nostrils daily. Reported on 01/21/2016    . losartan (COZAAR) 100 MG tablet Take 1 tablet (100 mg total) by mouth daily. 30 tablet 3  . losartan-hydrochlorothiazide (HYZAAR) 100-25 MG tablet Take 1 tablet by mouth daily. Reported on 01/25/2016  1  . omeprazole (PRILOSEC) 40 MG capsule TAKE 1 CAPSULE (40 MG TOTAL) BY MOUTH DAILY. 90 capsule 1  . tamsulosin (FLOMAX) 0.4 MG CAPS capsule TAKE ONE CAPSULE BY MOUTH EVERY DAY 30 capsule 1  . ZYTIGA 250  MG tablet TAKE FOUR TABLETS BY MOUTH ONE TIME DAILY ON AN EMPTY STOMACH, AT LEAST 1 HOUR BEFORE AND 2 HOURS AFTER FOOD 120 tablet 0   No current facility-administered medications for this visit.     Allergies:  Allergies  Allergen Reactions  . Losartan Potassium-Hctz Other (See Comments)    dizziness    Past Medical History, Surgical history, Social history, and Family History were reviewed and updated.   Physical Exam: There were  no vitals taken for this visit. ECOG: 1 General appearance: Pleasant-appearing gentleman appeared without distress. Head: Normocephalic, without obvious abnormality no oral ulcers or lesions. Neck: no adenopathy Lymph nodes: Cervical, supraclavicular, and axillary nodes normal. Heart:regular rate and rhythm, S1, S2 normal, no murmur, click, rub or gallop Lung:chest clear, no wheezing, rales, normal symmetric air entry Abdomin: soft, non-tender, without masses or organomegaly no shifting dullness or ascites. EXT:no erythema, induration, or nodules Neurological examination: No deficits noted.  Lab Results: Lab Results  Component Value Date   WBC 6.5 02/19/2016   HGB 10.4* 02/19/2016   HCT 32.6* 02/19/2016   MCV 84.9 02/19/2016   PLT 80* 02/19/2016     Chemistry      Component Value Date/Time   NA 144 01/22/2016 1115   NA 141 01/21/2016 0001   K 3.5 01/22/2016 1115   K 3.8 01/21/2016 0001   CL 107 01/21/2016 0001   CO2 25 01/22/2016 1115   CO2 23 01/21/2016 0001   BUN 16.7 01/22/2016 1115   BUN 13 01/21/2016 0001   CREATININE 1.4* 01/22/2016 1115   CREATININE 1.02 01/21/2016 0001   CREATININE 1.10 09/30/2011 1453      Component Value Date/Time   CALCIUM 9.3 01/22/2016 1115   CALCIUM 9.0 01/21/2016 0001   ALKPHOS 1,080* 01/22/2016 1115   ALKPHOS 901* 01/21/2016 0001   AST 37* 01/22/2016 1115   AST 28 01/21/2016 0001   ALT 13 01/22/2016 1115   ALT 13 01/21/2016 0001   BILITOT 0.63 01/22/2016 1115   BILITOT 0.6 01/21/2016 0001        Impression and Plan:  65 year old gentleman with the following issues:  1. Advanced prostate cancer presenting with diffuse bony metastasis and bulky lymphadenopathy and PSA of 899.8. This is biopsy proven to be prostate cancer.   He is currently on Lupron started in June 2016 but have developed castration resistant disease with a PSA rise up to 702.5.  He is currently on Zytiga and have tolerated it well. His PSA is currently  pending and if he develops adequate response, the plan is to continue with the same dose and schedule. He develops less than adequate response, systemic chemotherapy will be used.   2. Bone directed therapy: He received Xgeva without any major complications. This will be resumed with the next visit in June 2017. Complications such as osteonecrosis of the jaw as well as hypocalcemia were reviewed with the patient today.  3. Dural/epidural mass noted over the frontal lobe with moderate regional mass effect noted on an MRI on 01/22/2016. He is status post whole brain radiation as well as tapering doses of steroids. Clinically improving with improved speech and no neurological deficits.  4. Pain: Seems to be reasonably controlled with mild analgesia. No changes noted.  5. Prognosis: His prognosis is rather poor given the aggressive nature of his cancer and side of metastasis. He still have reasonable performance status and would likely benefit from palliative radiation therapy as well as palliative systemic therapy.  6. Follow-up: Will  be in 4 weeks to evaluate his progress as well as his response to [employer] [facility]     Zola Button, MD 5/5/201712:58 PM

## 2016-02-20 LAB — PSA: Prostate Specific Ag, Serum: 152.7 ng/mL — ABNORMAL HIGH (ref 0.0–4.0)

## 2016-02-22 ENCOUNTER — Encounter: Payer: Self-pay | Admitting: Radiation Oncology

## 2016-02-22 NOTE — Progress Notes (Signed)
Paperwork received from patient, given to nurse, advised patient i would call when ready, he asked that i fax it when complete and hold copy for him

## 2016-02-23 ENCOUNTER — Telehealth: Payer: Self-pay | Admitting: Pharmacist

## 2016-02-23 ENCOUNTER — Encounter: Payer: Self-pay | Admitting: Pharmacist

## 2016-02-23 NOTE — Telephone Encounter (Signed)
[facility] that the [employer] savings program id# was processed and it will be a $10 copay.  [facility] to [facility] up delivery.  I left message with son to be expecting the call.

## 2016-02-23 NOTE — Progress Notes (Signed)
Oral chemo call to [employer] savings program [phone].  Spoke with rep regarding the denial of ID number.  She states the pharmacy needs to call [phone] to verify the patient does not have any medicare prescription drug coverage as this will make him ineligible for use of this savings program.  Our records indicate he DOES NOT have medicare rx coverage.  [facility] provided with the request and number to call the [place] to relay needed information.  Per [facility] Overall rep ID will work once this information is verified.

## 2016-02-24 ENCOUNTER — Telehealth: Payer: Self-pay | Admitting: Radiation Oncology

## 2016-02-24 NOTE — Progress Notes (Signed)
  Radiation Oncology         [phone]) [phone] ________________________________  Name: Steven Church MRN: [id number]  Date: 02/12/2016  DOB: [birth year]  End of Treatment Note   ICD-9-CM ICD-10-CM    1. 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 The patient was immobilized with a thermoplastic mask and isocenter was verified with weekly port films.  Narrative: The patient tolerated radiation treatment relatively well.  Plan: The patient has completed radiation treatment. The patient will return to radiation oncology clinic for routine followup in one month. I advised them to call or return sooner if they have any questions or concerns related to their recovery or treatment. ________________________________  Sheral Apley. Tammi Klippel, M.D.

## 2016-02-24 NOTE — Telephone Encounter (Signed)
Placed orange folder with complete [employer] disability forms in Alison's inbox to sign.

## 2016-02-25 ENCOUNTER — Encounter: Payer: Self-pay | Admitting: Radiation Oncology

## 2016-02-25 NOTE — Progress Notes (Signed)
Paperwork Psychologist, counselling) received from patient, given to nurse 5/11

## 2016-02-26 ENCOUNTER — Telehealth: Payer: Self-pay | Admitting: Medical

## 2016-02-26 ENCOUNTER — Telehealth: Payer: Self-pay | Admitting: Radiation Oncology

## 2016-02-26 ENCOUNTER — Other Ambulatory Visit: Payer: Self-pay | Admitting: Medical

## 2016-02-26 ENCOUNTER — Encounter: Payer: Self-pay | Admitting: Radiation Oncology

## 2016-02-26 MED ORDER — LOSARTAN POTASSIUM 100 MG PO TABS: 100.0000 mg | ORAL_TABLET | Freq: Every day | ORAL | Status: DC

## 2016-02-26 NOTE — Progress Notes (Signed)
Paperwork received from doctor, faxed to [facility] with [employer] @ [phone], confirmation received, copy  made for patient 5/12

## 2016-02-26 NOTE — Telephone Encounter (Signed)
Rcvd 90 day refill request for Losartan 100 mg

## 2016-02-26 NOTE — Telephone Encounter (Signed)
Looks like we are not the last one who refilled this, Is this ok to refill?

## 2016-02-26 NOTE — Telephone Encounter (Signed)
Placed complete [employer] disability paperwork inside orange folder in Alison's inbox to sign.

## 2016-02-29 ENCOUNTER — Telehealth: Payer: Self-pay | Admitting: Internal Medicine

## 2016-02-29 ENCOUNTER — Encounter: Payer: Self-pay | Admitting: Radiation Oncology

## 2016-02-29 ENCOUNTER — Ambulatory Visit: Payer: Managed Care, Other (non HMO)

## 2016-02-29 NOTE — Telephone Encounter (Signed)
Rcvd fax requesting generic refill ok for cozaar for 90 days to [facility] for expense to pt. Victor[place] December

## 2016-02-29 NOTE — Telephone Encounter (Signed)
I sent this last week, call the pharmacy and verify to make sure there isn't issue.

## 2016-02-29 NOTE — Progress Notes (Signed)
Paperwork Psychologist, counselling) received from doctor faxed to [facility] [phone], confirmation received, copy help for patient to p/u, 5/15

## 2016-02-29 NOTE — Telephone Encounter (Signed)
Spoke to pharmacy and they said disregard fax.

## 2016-03-02 ENCOUNTER — Ambulatory Visit: Payer: Managed Care, Other (non HMO)

## 2016-03-02 DIAGNOSIS — R4701 Aphasia: Secondary | ICD-10-CM

## 2016-03-02 DIAGNOSIS — R482 Apraxia: Secondary | ICD-10-CM

## 2016-03-02 NOTE — Patient Instructions (Signed)
  Please complete the assigned speech therapy homework and return it to your next session.  

## 2016-03-02 NOTE — Therapy (Signed)
[facility] [address] [place], [place], [address] Phone: [phone]   Fax:  [phone]  Speech Language Pathology Treatment  Patient Details  Name: Steven Church MRN: [id number] Date of Birth: 12-18-1950 Referring Provider: Worthy Flank, NP  Encounter Date: 03/02/2016      End of Session - 03/02/16 1401    Visit Number 2   Number of Visits 16   Date for SLP Re-Evaluation 05/13/16   SLP Start Time [id number]   SLP Stop Time  1357   SLP Time Calculation (min) 39 min   Activity Tolerance Patient tolerated treatment well      Past Medical History  Diagnosis Date  . Hypertension   . Obesity   . Prostate cancer ([place]) 04/07/2015    bone mets, started oncology 03/2015  . Family history of cancer   . Former smoker   . Bone cancer ([place])   . Brain cancer [facility])     Past Surgical History  Procedure Laterality Date  . Colonoscopy      never    There were no vitals filed for this visit.      Subjective Assessment - 03/02/16 1321    Subjective Pt cont with mild hesitations, pauses in spontaneous speech, as last week.               ADULT SLP TREATMENT - 03/02/16 1322    General Information   Behavior/Cognition Alert;Cooperative;Pleasant mood   Treatment Provided   Treatment provided Cognitive-Linquistic   Pain Assessment   Pain Assessment No/denies pain   Cognitive-Linquistic Treatment   Treatment focused on Apraxia;Aphasia   Skilled Treatment SLP targeted multisyllable words and 5-7 word sentences in order to teach pt about slower rate as helpful in speech fluidity. Pt's success incr'd as the task progressed, from 50% success to 85% success. In sentence responses, pt's speech was more dysfluent and disjointed when he got excited about content in the response; overall success 75%.   Assessment / Recommendations / Plan   Plan Continue with current plan of care   Progression Toward Goals   Progression toward goals Progressing toward goals            SLP Short Term Goals - 03/02/16 1402    SLP SHORT TERM GOAL #1   Title pt will demo HEP for verbal expression with rare min A   Time 4   Period Weeks   Status On-going   SLP SHORT TERM GOAL #2   Title  pt will generate WNL speech in 8 minute simple conversation over two sessions   Time 4   Period Weeks   Status On-going   SLP SHORT TERM GOAL #3   Title pt will complete mod complex naming tasks with 90% success and rare min A   Time 4   Period Weeks   Status On-going          SLP Long Term Goals - 03/02/16 1403    SLP LONG TERM GOAL #1   Title pt will complete HEP independently   Time 8   Period Weeks   Status On-going   SLP LONG TERM GOAL #2   Title pt will generate 8 minutes mod complex conversation with modified independence (slower rate, etc)   Time 8   Period Weeks   Status On-going          Plan - 03/02/16 1401    Clinical Impression Statement 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 Pt cont to report disappointment from his reduced speaking skills. He preformed better today in conversation when he focused on slowing his rate of speech. Skilled ST owuld benefit pt to improve his verbal expression skills and imrove his QOL.   Speech Therapy Frequency 2x / week   Duration --  8 weeks   Treatment/Interventions SLP instruction and feedback;Compensatory strategies;Patient/family education;Cueing hierarchy;Language facilitation  HEPs   Potential to Achieve Goals Fair   Potential Considerations Medical prognosis   Consulted and Agree with Plan of Care Patient      Patient will benefit from skilled therapeutic intervention in order to improve the following deficits and impairments:   Apraxia  Aphasia    Problem List Patient Active Problem List   Diagnosis Date Noted  . DNR (do not resuscitate) discussion   . Aphasia   . Prostate cancer metastatic to bone ([place])   .  Palliative care encounter   . Brain metastasis ([place]) 01/25/2016  . Prostate cancer ([place]) 04/07/2015  . Retroperitoneal lymphadenopathy   . Mediastinal lymphadenopathy 03/12/2015  . Lung mass 03/09/2015  . Essential hypertension 07/11/2014  . Rhinitis, allergic 07/11/2014    [facility] ,[place], CCC-SLP   03/02/2016, 2:03 PM  [facility] [address] [place], [place], [address] Phone: [phone]   Fax:  [phone]   Name: Steven Church MRN: [id number] Date of Birth: 1951/02/06

## 2016-03-03 ENCOUNTER — Ambulatory Visit: Payer: Managed Care, Other (non HMO)

## 2016-03-03 DIAGNOSIS — R4701 Aphasia: Secondary | ICD-10-CM

## 2016-03-03 DIAGNOSIS — R482 Apraxia: Secondary | ICD-10-CM

## 2016-03-03 NOTE — Therapy (Signed)
[facility] [address] [place], [place], [address] Phone: [phone]   Fax:  [phone]  Speech Language Pathology Treatment  Patient Details  Name: ALVYN CRYTZER MRN: [id number] Date of Birth: July 04, 1951 Referring Provider: Worthy Flank, NP  Encounter Date: 03/03/2016      End of Session - 03/03/16 0844    Visit Number 3   Number of Visits 16   Date for SLP Re-Evaluation 05/13/16   SLP Start Time 0803   SLP Stop Time  0845   SLP Time Calculation (min) 42 min   Activity Tolerance Patient tolerated treatment well      Past Medical History  Diagnosis Date  . Hypertension   . Obesity   . Prostate cancer ([place]) 04/07/2015    bone mets, started oncology 03/2015  . Family history of cancer   . Former smoker   . Bone cancer ([place])   . Brain cancer [facility])     Past Surgical History  Procedure Laterality Date  . Colonoscopy      never    There were no vitals filed for this visit.      Subjective Assessment - 03/03/16 0808    Subjective Pt needed his sister to tell him, while doing homework, that he was going too fast.   Currently in Pain? No/denies               ADULT SLP TREATMENT - 03/03/16 0808    General Information   Behavior/Cognition Alert;Cooperative;Pleasant mood   Treatment Provided   Treatment provided Cognitive-Linquistic   Cognitive-Linquistic Treatment   Treatment focused on Aphasia;Apraxia   Skilled Treatment SLP targeted aphasia in today's session with pt as he wrote down 10 items in a simple concrete category. Pt req'd min-mod A occasionally. In simple conversation about pt's job he req'd min-mod A usually to reduce rate of speech.   Assessment / Recommendations / Plan   Plan Continue with current plan of care   Progression Toward Goals   Progression toward goals Progressing toward goals            SLP Short Term Goals - 03/02/16 1402    SLP SHORT TERM GOAL #1    Title pt will demo HEP for verbal expression with rare min A   Time 4   Period Weeks   Status On-going   SLP SHORT TERM GOAL #2   Title  pt will generate WNL speech in 8 minute simple conversation over two sessions   Time 4   Period Weeks   Status On-going   SLP SHORT TERM GOAL #3   Title pt will complete mod complex naming tasks with 90% success and rare min A   Time 4   Period Weeks   Status On-going          SLP Long Term Goals - 03/02/16 1403    SLP LONG TERM GOAL #1   Title pt will complete HEP independently   Time 8   Period Weeks   Status On-going   SLP LONG TERM GOAL #2   Title pt will generate 8 minutes mod complex conversation with modified independence (slower rate, etc)   Time 8   Period Weeks   Status On-going          Plan - 03/03/16 0845    Clinical Impression Statement 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 Pt cont to report disappointment from his reduced speaking skills. He preformed better today  in conversation when he focused on slowing his rate of speech. Skilled ST owuld benefit pt to improve his verbal expression skills and imrove his QOL.   Speech Therapy Frequency 2x / week   Duration --  8 weeks   Treatment/Interventions SLP instruction and feedback;Compensatory strategies;Patient/family education;Cueing hierarchy;Language facilitation  HEPs   Potential to Achieve Goals Fair   Potential Considerations Medical prognosis   Consulted and Agree with Plan of Care Patient      Patient will benefit from skilled therapeutic intervention in order to improve the following deficits and impairments:   Aphasia  Apraxia    Problem List Patient Active Problem List   Diagnosis Date Noted  . DNR (do not resuscitate) discussion   . Aphasia   . Prostate cancer metastatic to bone ([place])   . Palliative care encounter   . Brain metastasis ([place]) 01/25/2016  . Prostate cancer ([place]) 04/07/2015  . Retroperitoneal  lymphadenopathy   . Mediastinal lymphadenopathy 03/12/2015  . Lung mass 03/09/2015  . Essential hypertension 07/11/2014  . Rhinitis, allergic 07/11/2014    [facility] ,MS, CCC-SLP  03/03/2016, 8:45 AM  [facility] [address] [place], [place], [address] Phone: [phone]   Fax:  [phone]   Name: BERNE HARTPENCE MRN: [id number] Date of Birth: 1951/09/18

## 2016-03-03 NOTE — Patient Instructions (Signed)
  Please complete the assigned speech therapy homework and return it to your next session.  

## 2016-03-04 ENCOUNTER — Telehealth: Payer: Self-pay | Admitting: Oncology

## 2016-03-04 ENCOUNTER — Other Ambulatory Visit (HOSPITAL_BASED_OUTPATIENT_CLINIC_OR_DEPARTMENT_OTHER): Payer: Managed Care, Other (non HMO)

## 2016-03-04 ENCOUNTER — Other Ambulatory Visit: Payer: Self-pay | Admitting: *Deleted

## 2016-03-04 ENCOUNTER — Ambulatory Visit (HOSPITAL_BASED_OUTPATIENT_CLINIC_OR_DEPARTMENT_OTHER): Payer: Managed Care, Other (non HMO)

## 2016-03-04 ENCOUNTER — Encounter: Payer: Self-pay | Admitting: Medical Oncology

## 2016-03-04 ENCOUNTER — Ambulatory Visit (HOSPITAL_BASED_OUTPATIENT_CLINIC_OR_DEPARTMENT_OTHER): Payer: Managed Care, Other (non HMO) | Admitting: Oncology

## 2016-03-04 VITALS — BP 136/75 | HR 88 | Temp 98.1°F | Resp 18 | Ht 70.0 in | Wt 221.7 lb

## 2016-03-04 DIAGNOSIS — C7931 Secondary malignant neoplasm of brain: Secondary | ICD-10-CM

## 2016-03-04 DIAGNOSIS — E291 Testicular hypofunction: Secondary | ICD-10-CM

## 2016-03-04 DIAGNOSIS — C7951 Secondary malignant neoplasm of bone: Secondary | ICD-10-CM

## 2016-03-04 DIAGNOSIS — C61 Malignant neoplasm of prostate: Secondary | ICD-10-CM

## 2016-03-04 DIAGNOSIS — Z5111 Encounter for antineoplastic chemotherapy: Secondary | ICD-10-CM

## 2016-03-04 LAB — CBC WITH DIFFERENTIAL/PLATELET
BASO%: 0.3 % (ref 0.0–2.0)
Basophils Absolute: 0 10*3/uL (ref 0.0–0.1)
EOS%: 1.3 % (ref 0.0–7.0)
Eosinophils Absolute: 0.1 10*3/uL (ref 0.0–0.5)
HCT: 28.5 % — ABNORMAL LOW (ref 38.4–49.9)
HGB: 9.4 g/dL — ABNORMAL LOW (ref 13.0–17.1)
LYMPH%: 28.2 % (ref 14.0–49.0)
MCH: 27.7 pg (ref 27.2–33.4)
MCHC: 33.1 g/dL (ref 32.0–36.0)
MCV: 83.6 fL (ref 79.3–98.0)
MONO#: 0.6 10*3/uL (ref 0.1–0.9)
MONO%: 10.7 % (ref 0.0–14.0)
NEUT%: 59.5 % (ref 39.0–75.0)
NEUTROS ABS: 3.3 10*3/uL (ref 1.5–6.5)
PLATELETS: 224 10*3/uL (ref 140–400)
RBC: 3.4 10*6/uL — AB (ref 4.20–5.82)
RDW: 19.7 % — ABNORMAL HIGH (ref 11.0–14.6)
WBC: 5.6 10*3/uL (ref 4.0–10.3)
lymph#: 1.6 10*3/uL (ref 0.9–3.3)

## 2016-03-04 LAB — COMPREHENSIVE METABOLIC PANEL
ALT: 26 U/L (ref 0–55)
ANION GAP: 9 meq/L (ref 3–11)
AST: 35 U/L — ABNORMAL HIGH (ref 5–34)
Albumin: 2.7 g/dL — ABNORMAL LOW (ref 3.5–5.0)
Alkaline Phosphatase: 2137 U/L — ABNORMAL HIGH (ref 40–150)
BUN: 8.7 mg/dL (ref 7.0–26.0)
CHLORIDE: 112 meq/L — AB (ref 98–109)
CO2: 23 meq/L (ref 22–29)
CREATININE: 1 mg/dL (ref 0.7–1.3)
Calcium: 9.2 mg/dL (ref 8.4–10.4)
EGFR: 87 mL/min/{1.73_m2} — AB (ref >90)
GLUCOSE: 84 mg/dL (ref 70–140)
Potassium: 2.6 mEq/L — CL (ref 3.5–5.1)
SODIUM: 144 meq/L (ref 136–145)
Total Bilirubin: 0.51 mg/dL (ref 0.20–1.20)
Total Protein: 6.1 g/dL — ABNORMAL LOW (ref 6.4–8.3)

## 2016-03-04 MED ORDER — POTASSIUM CHLORIDE CRYS ER 20 MEQ PO TBCR: 20.0000 meq | EXTENDED_RELEASE_TABLET | Freq: Two times a day (BID) | ORAL | Status: DC

## 2016-03-04 MED ORDER — DENOSUMAB 120 MG/1.7ML ~~LOC~~ SOLN: 120.0000 mg | Freq: Once | SUBCUTANEOUS | Status: DC

## 2016-03-04 MED ORDER — LEUPROLIDE ACETATE (4 MONTH) 30 MG IM KIT
30.0000 mg | PACK | Freq: Once | INTRAMUSCULAR | Status: AC
  Administered 2016-03-04: 30 mg via INTRAMUSCULAR
  Filled 2016-03-04: qty 30

## 2016-03-04 MED ORDER — DENOSUMAB 120 MG/1.7ML ~~LOC~~ SOLN
120.0000 mg | Freq: Once | SUBCUTANEOUS | Status: AC
  Administered 2016-03-04: 120 mg via SUBCUTANEOUS
  Filled 2016-03-04: qty 1.7

## 2016-03-04 NOTE — Telephone Encounter (Signed)
Gave pt apt & avs °

## 2016-03-04 NOTE — Addendum Note (Signed)
Addended by: Randolm Idol on: 03/04/2016 12:17 PM   Modules accepted: Orders

## 2016-03-04 NOTE — Progress Notes (Signed)
Hematology and Oncology Follow Up Visit  Steven Church [id number] 15-Jan-1951 65 y.o. 03/04/2016 11:54 AM   Principle Diagnosis: 65 year old gentleman with prostate cancer presented with diffuse bony metastasis and lymphadenopathy. His PSA is 899 with unknown Gleason score. His diagnosis was confirmed in June 2016. 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   Prior Therapy:  He is status post a biopsy obtained of the soft tissue in the retroperitoneal area on 04/03/2015. He is status post whole brain radiation for a total of 30 gray in 10 fractions completed in April 2017.  Current therapy:  Lupron 22.5 mg IM every 3 months started in June 2016.  Xgeva to start on 07/24/2015.  Zytiga 1000 mg started in April 2017.  Interim History:  Steven Church presents today for a follow-up visit. Since the last visit, he completed the radiation therapy without any delayed complications. He had been tapered off dexamethasone and feeling reasonably well. He does have fatigue which is his only complaint at this time. His appetite remained excellent and he is gaining weight.  His speech has improved since the last visit. He denied any dizziness, headache or syncope.  He continued Zytiga without any specific complications such as weight loss, nausea or dyspepsia. He did report lower extremity swelling as mentioned which could be also related to steroids.   He does not report any fevers, chills, sweats, weight loss or appetite changes. He does not report any chest pain, palpitation, orthopnea or leg edema. He does not report any cough, shortness of breath, dyspnea on exertion, wheezing or hemoptysis. He does not report any nausea, vomiting, abdominal pain, hematochezia, or early satiety. He does not report any hematuria or dysuria. He does report frequency, hesitancy and nocturia. He does not report any skeletal complaints of arthralgias or myalgias.  Remaining review of systems unremarkable.  Medications: I have reviewed the patient's current medications.  Current Outpatient Prescriptions  Medication Sig Dispense Refill  . Aspirin-Caffeine (BC FAST PAIN RELIEF PO) Take 1 Package by mouth as needed (pain). Reported on 01/21/2016    . calcium-vitamin D 250-100 MG-UNIT per tablet Take 1 tablet by mouth 2 (two) times daily. 60 tablet 3  . [employer] OYSTER SHELL CALCIUM+VIT D 500-125 MG-UNIT TABS Take 1 tablet by mouth 2 (two) times daily.  3  . dexamethasone (DECADRON) 4 MG tablet Take 0.5 tablets (2 mg total) by mouth 2 (two) times daily. Half pill (2 mg) twice daily for 2 weeks, then 2 mg once daily for 2 weeks then stop 60 tablet 0  . finasteride (PROSCAR) 5 MG tablet Take 1 tablet (5 mg total) by mouth daily. 90 tablet 0  . fluticasone (FLONASE) 50 MCG/ACT nasal spray Place 2 sprays into both nostrils daily. Reported on 01/21/2016    . losartan-hydrochlorothiazide (HYZAAR) 100-25 MG tablet Take 1 tablet by mouth daily.    Marland Kitchen omeprazole (PRILOSEC) 40 MG capsule TAKE 1 CAPSULE (40 MG TOTAL) BY MOUTH DAILY. 90 capsule 1  . tamsulosin (FLOMAX) 0.4 MG CAPS capsule TAKE ONE CAPSULE BY MOUTH EVERY DAY 30 capsule 1  . ZYTIGA 250 MG tablet TAKE FOUR TABLETS BY MOUTH ONE TIME DAILY ON AN EMPTY STOMACH, AT LEAST 1 HOUR BEFORE AND 2 HOURS AFTER FOOD 120 tablet 0   No current facility-administered medications for this visit.     Allergies:  Allergies  Allergen Reactions  . Losartan Potassium-Hctz Other (See Comments)    dizziness    Past Medical  History, Surgical history, Social history, and Family History were reviewed and updated.   Physical Exam: Blood pressure 136/75, pulse 88, temperature 98.1 F (36.7 C), temperature source Oral, resp. rate 18, height 5\' 10"  (1.778 m), weight 221 lb 11.2 oz (100.562 kg), SpO2 97 %. ECOG: 1 General appearance: Alert, awake gentleman without distress. Speech is improved. Head: Normocephalic, without obvious  abnormality no oral thrush noted. Neck: no adenopathy Lymph nodes: Cervical, supraclavicular, and axillary nodes normal. Heart:regular rate and rhythm, S1, S2 normal, no murmur, click, rub or gallop Lung:chest clear, no wheezing, rales, normal symmetric air entry Abdomin: soft, non-tender, without masses or organomegaly no rebound or guarding. EXT:no erythema, induration, or nodules Neurological examination: No deficits noted.  Lab Results: Lab Results  Component Value Date   WBC 5.6 03/04/2016   HGB 9.4* 03/04/2016   HCT 28.5* 03/04/2016   MCV 83.6 03/04/2016   PLT 224 03/04/2016     Chemistry      Component Value Date/Time   NA 142 02/19/2016 1231   NA 141 01/21/2016 0001   K 4.0 02/19/2016 1231   K 3.8 01/21/2016 0001   CL 107 01/21/2016 0001   CO2 23 02/19/2016 1231   CO2 23 01/21/2016 0001   BUN 18.0 02/19/2016 1231   BUN 13 01/21/2016 0001   CREATININE 0.9 02/19/2016 1231   CREATININE 1.02 01/21/2016 0001   CREATININE 1.10 09/30/2011 1453      Component Value Date/Time   CALCIUM 8.5 02/19/2016 1231   CALCIUM 9.0 01/21/2016 0001   ALKPHOS 2,261* 02/19/2016 1231   ALKPHOS 901* 01/21/2016 0001   AST 16 02/19/2016 1231   AST 28 01/21/2016 0001   ALT 22 02/19/2016 1231   ALT 13 01/21/2016 0001   BILITOT 0.63 02/19/2016 1231   BILITOT 0.6 01/21/2016 0001        Impression and Plan:  65 year old gentleman with the following issues:  1. Advanced prostate cancer presenting with diffuse bony metastasis and bulky lymphadenopathy and PSA of 899.8. This is biopsy proven to be prostate cancer.   He is currently on Lupron started in June 2016 but have developed castration resistant disease with a PSA rise up to 702.5 As well as brain metastasis.  He is currently on Zytiga and have tolerated it well. His PSA had an excellent response with a PSA dropped from 702 to 152. The plan is to continue with the same dose and schedule without any dose reduction or delay.   2.  Bone directed therapy: He received Xgeva without any major complications. He will receive this with every visit on a monthly basis.  3. Dural/epidural mass noted over the frontal lobe with moderate regional mass effect noted on an MRI on 01/22/2016. He is status post whole brain radiation as well as tapering doses of steroids. No residual deficits at this time as he is tapered off steroids.  4. Pain: Seems to be reasonably controlled with mild analgesia. No changes noted.  5. Prognosis: His prognosis is rather poor given the aggressive nature of his cancer and side of metastasis. He still have reasonable performance status and would likely benefit from palliative systemic therapy.  6. Androgen deprivation: He will continue on Lupron 22.5 mg every 3 months and he will receive his next injection on 03/04/2016.  7. Follow-up: Will be in 4 weeks to evaluate his progress as well as his response to Zytiga.     [facility], MD 5/19/201711:54 AM

## 2016-03-04 NOTE — Progress Notes (Signed)
Oncology Nurse Navigator Documentation  Oncology Nurse Navigator Flowsheets 02/03/2016 02/08/2016 03/04/2016  Navigator Encounter Type Treatment Treatment Follow-up Appt  Treatment Initiated Date 01/25/2016 - -  Patient Visit Type RadOnc - MedOnc- Steven Church saw Dr. [name] today.He states that Dr. [name] was pleased with his PSA decrease. He has finished his decadron taper and states he is feeling pretty well. He has been undergoing speech therapy and notes he is doing better. He hopes that it will continue to improve. His goal is to return to work but if not he will have to go on long term disability. I asked him to call me with any questions or concerns. He voiced understanding.  Treatment Phase Treatment Treatment -  Barriers/Navigation Needs - - -  Education - Other -  Interventions - Education Method -  Education Method - Teach-back;Verbal -  Support Groups/Services - Friends and Family Friends and Family  Acuity - - -  Acuity Level 1 - - -  Time Spent with Patient 15 15 30   Time Spent with Patient (Retired) - - -

## 2016-03-05 LAB — PSA: PROSTATE SPECIFIC AG, SERUM: 144.1 ng/mL — AB (ref 0.0–4.0)

## 2016-03-07 ENCOUNTER — Ambulatory Visit: Payer: Managed Care, Other (non HMO)

## 2016-03-07 DIAGNOSIS — R4701 Aphasia: Secondary | ICD-10-CM

## 2016-03-07 DIAGNOSIS — R482 Apraxia: Secondary | ICD-10-CM

## 2016-03-07 NOTE — Therapy (Signed)
[facility] [address] [place], [place], [address] Phone: [phone]   Fax:  [phone]  Speech Language Pathology Treatment  Patient Details  Name: Steven Church MRN: [id number] Date of Birth: [birth year] Referring Provider: Worthy Flank, NP  Encounter Date: 03/07/2016      End of Session - 03/07/16 1100    Visit Number 4   Number of Visits 16   Date for SLP Re-Evaluation 05/13/16   Authorization Type [employer]   Authorization - Visit Number 3   Authorization - Number of Visits 8   SLP Start Time [id number]   SLP Stop Time  1100   SLP Time Calculation (min) 43 min   Activity Tolerance Patient tolerated treatment well      Past Medical History  Diagnosis Date  . Hypertension   . Obesity   . Prostate cancer ([place]) 04/07/2015    bone mets, started oncology 03/2015  . Family history of cancer   . Former smoker   . Bone cancer ([place])   . Brain cancer [facility])     Past Surgical History  Procedure Laterality Date  . Colonoscopy      never    There were no vitals filed for this visit.      Subjective Assessment - 03/07/16 1030    Subjective 'Pt's speech is much more fluid than last week.               ADULT SLP TREATMENT - 03/07/16 1030    General Information   Behavior/Cognition Alert;Cooperative;Pleasant mood   Treatment Provided   Treatment provided Cognitive-Linquistic   Pain Assessment   Pain Assessment No/denies pain   Cognitive-Linquistic Treatment   Treatment focused on Aphasia;Apraxia   Skilled Treatment In simple conversation today pt's speech fluidity was improved over last week, SBA for slower rate in first 10 minutes, as conversation progressed pt's fluidity decr'd slightly. SLP educated pt re: asking listener question to afford him time to rest his brain and recharge. In mod complex conversation pt req'd rare min A for slower rate (10 minutes conversation)    Assessment /  Recommendations / Plan   Plan Continue with current plan of care   Progression Toward Goals   Progression toward goals Progressing toward goals            SLP Short Term Goals - 03/07/16 1102    SLP SHORT TERM GOAL #1   Title pt will demo HEP for verbal expression with rare min A   Time 3   Period Weeks   Status On-going   SLP SHORT TERM GOAL #2   Title  pt will generate WNL speech in 8 minute simple conversation over two sessions   Time 3   Period Weeks   Status On-going   SLP SHORT TERM GOAL #3   Title pt will complete mod complex naming tasks with 90% success and rare min A   Time 3   Period Weeks   Status On-going          SLP Long Term Goals - 03/07/16 1102    SLP LONG TERM GOAL #1   Title pt will complete HEP independently   Time 7   Period Weeks   Status On-going   SLP LONG TERM GOAL #2   Title pt will generate 8 minutes mod complex conversation with modified independence (slower rate, etc)   Time 7   Period Weeks   Status On-going  Plan - 03/07/16 1101    Clinical Impression Statement 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 Pt states he is more mindful of slower rate than before ST. Pt cont to report disappointment from his reduced speaking skills. Skilled ST owuld benefit pt to improve his verbal expression skills and imrove his QOL.   Speech Therapy Frequency 2x / week   Duration --  7 weeks   Treatment/Interventions SLP instruction and feedback;Compensatory strategies;Patient/family education;Cueing hierarchy;Language facilitation  HEPs   Potential to Achieve Goals Fair   Potential Considerations Medical prognosis   Consulted and Agree with Plan of Care Patient      Patient will benefit from skilled therapeutic intervention in order to improve the following deficits and impairments:   Aphasia  Apraxia    Problem List Patient Active Problem List   Diagnosis Date Noted   . DNR (do not resuscitate) discussion   . Aphasia   . Prostate cancer metastatic to bone ([place])   . Palliative care encounter   . Brain metastasis ([place]) 01/25/2016  . Prostate cancer ([place]) 04/07/2015  . Retroperitoneal lymphadenopathy   . Mediastinal lymphadenopathy 03/12/2015  . Lung mass 03/09/2015  . Essential hypertension 07/11/2014  . Rhinitis, allergic 07/11/2014    [facility] ,[place], CCC-SLP  03/07/2016, 11:03 AM  [facility] [address] [place], [place], [address] Phone: [phone]   Fax:  [phone]   Name: Steven Church MRN: [id number] Date of Birth: 04-Feb-1951

## 2016-03-10 ENCOUNTER — Ambulatory Visit (HOSPITAL_BASED_OUTPATIENT_CLINIC_OR_DEPARTMENT_OTHER)
Admission: RE | Admit: 2016-03-10 | Discharge: 2016-03-10 | Disposition: A | Discharge status: Home / Self Care | Payer: Managed Care, Other (non HMO) | Source: Ambulatory Visit | Attending: Radiation Oncology | Admitting: Radiation Oncology

## 2016-03-10 ENCOUNTER — Telehealth: Payer: Self-pay | Admitting: Oncology

## 2016-03-10 ENCOUNTER — Encounter: Payer: Self-pay | Admitting: Radiation Oncology

## 2016-03-10 ENCOUNTER — Ambulatory Visit
Admission: RE | Admit: 2016-03-10 | Discharge: 2016-03-10 | Disposition: A | Discharge status: Home / Self Care | Payer: Managed Care, Other (non HMO) | Source: Ambulatory Visit | Attending: Radiation Oncology | Admitting: Radiation Oncology

## 2016-03-10 ENCOUNTER — Inpatient Hospital Stay (HOSPITAL_COMMUNITY)
Admission: EM | Admit: 2016-03-10 | Discharge: 2016-03-16 | DRG: 177 | Disposition: A | Discharge status: Home Health | Payer: Managed Care, Other (non HMO) | Attending: Internal Medicine | Admitting: Internal Medicine

## 2016-03-10 ENCOUNTER — Encounter (HOSPITAL_COMMUNITY): Payer: Self-pay | Admitting: Emergency Medicine

## 2016-03-10 ENCOUNTER — Emergency Department (HOSPITAL_COMMUNITY): Payer: Managed Care, Other (non HMO)

## 2016-03-10 VITALS — BP 156/91 | HR 115 | Temp 98.1°F | Resp 18

## 2016-03-10 DIAGNOSIS — Z888 Allergy status to other drugs, medicaments and biological substances status: Secondary | ICD-10-CM

## 2016-03-10 DIAGNOSIS — J690 Pneumonitis due to inhalation of food and vomit: Secondary | ICD-10-CM | POA: Diagnosis not present

## 2016-03-10 DIAGNOSIS — G9341 Metabolic encephalopathy: Secondary | ICD-10-CM | POA: Diagnosis present

## 2016-03-10 DIAGNOSIS — Z923 Personal history of irradiation: Secondary | ICD-10-CM

## 2016-03-10 DIAGNOSIS — Z7189 Other specified counseling: Secondary | ICD-10-CM | POA: Insufficient documentation

## 2016-03-10 DIAGNOSIS — C7951 Secondary malignant neoplasm of bone: Secondary | ICD-10-CM | POA: Diagnosis present

## 2016-03-10 DIAGNOSIS — N179 Acute kidney failure, unspecified: Secondary | ICD-10-CM

## 2016-03-10 DIAGNOSIS — C61 Malignant neoplasm of prostate: Secondary | ICD-10-CM | POA: Diagnosis present

## 2016-03-10 DIAGNOSIS — R591 Generalized enlarged lymph nodes: Secondary | ICD-10-CM | POA: Diagnosis present

## 2016-03-10 DIAGNOSIS — G936 Cerebral edema: Secondary | ICD-10-CM | POA: Diagnosis present

## 2016-03-10 DIAGNOSIS — J189 Pneumonia, unspecified organism: Secondary | ICD-10-CM | POA: Diagnosis present

## 2016-03-10 DIAGNOSIS — Z7951 Long term (current) use of inhaled steroids: Secondary | ICD-10-CM

## 2016-03-10 DIAGNOSIS — I10 Essential (primary) hypertension: Secondary | ICD-10-CM | POA: Diagnosis present

## 2016-03-10 DIAGNOSIS — Y95 Nosocomial condition: Secondary | ICD-10-CM | POA: Diagnosis present

## 2016-03-10 DIAGNOSIS — D649 Anemia, unspecified: Secondary | ICD-10-CM | POA: Diagnosis present

## 2016-03-10 DIAGNOSIS — Z87891 Personal history of nicotine dependence: Secondary | ICD-10-CM

## 2016-03-10 DIAGNOSIS — E876 Hypokalemia: Secondary | ICD-10-CM | POA: Insufficient documentation

## 2016-03-10 DIAGNOSIS — R938 Abnormal findings on diagnostic imaging of other specified body structures: Secondary | ICD-10-CM | POA: Insufficient documentation

## 2016-03-10 DIAGNOSIS — R748 Abnormal levels of other serum enzymes: Secondary | ICD-10-CM | POA: Diagnosis present

## 2016-03-10 DIAGNOSIS — Z833 Family history of diabetes mellitus: Secondary | ICD-10-CM

## 2016-03-10 DIAGNOSIS — R609 Edema, unspecified: Secondary | ICD-10-CM

## 2016-03-10 DIAGNOSIS — R7881 Bacteremia: Secondary | ICD-10-CM

## 2016-03-10 DIAGNOSIS — Z801 Family history of malignant neoplasm of trachea, bronchus and lung: Secondary | ICD-10-CM

## 2016-03-10 DIAGNOSIS — C7931 Secondary malignant neoplasm of brain: Secondary | ICD-10-CM

## 2016-03-10 DIAGNOSIS — D630 Anemia in neoplastic disease: Secondary | ICD-10-CM | POA: Diagnosis present

## 2016-03-10 DIAGNOSIS — R4701 Aphasia: Secondary | ICD-10-CM

## 2016-03-10 DIAGNOSIS — T368X5A Adverse effect of other systemic antibiotics, initial encounter: Secondary | ICD-10-CM | POA: Diagnosis present

## 2016-03-10 DIAGNOSIS — R471 Dysarthria and anarthria: Secondary | ICD-10-CM | POA: Diagnosis present

## 2016-03-10 DIAGNOSIS — H0520 Unspecified exophthalmos: Secondary | ICD-10-CM | POA: Diagnosis present

## 2016-03-10 DIAGNOSIS — G9340 Encephalopathy, unspecified: Secondary | ICD-10-CM | POA: Diagnosis present

## 2016-03-10 DIAGNOSIS — Z8249 Family history of ischemic heart disease and other diseases of the circulatory system: Secondary | ICD-10-CM

## 2016-03-10 DIAGNOSIS — Z79899 Other long term (current) drug therapy: Secondary | ICD-10-CM

## 2016-03-10 DIAGNOSIS — B954 Other streptococcus as the cause of diseases classified elsewhere: Secondary | ICD-10-CM | POA: Diagnosis present

## 2016-03-10 DIAGNOSIS — R4182 Altered mental status, unspecified: Secondary | ICD-10-CM | POA: Diagnosis not present

## 2016-03-10 LAB — I-STAT CG4 LACTIC ACID, ED: Lactic Acid, Venous: 1.64 mmol/L (ref 0.5–2.0)

## 2016-03-10 MED ORDER — DEXAMETHASONE 2 MG PO TABS: 2.0000 mg | ORAL_TABLET | Freq: Two times a day (BID) | ORAL | Status: DC

## 2016-03-10 NOTE — ED Notes (Signed)
Per family, Pt has pancreatic cancer. Pt has been struggling over the past few weeks with his speech and sees a speech therapist. Today, however, pt went to cancer center to see doctor he usually speaks with. Pt then came home but per family was unable to relay the information he learned while at appointment. Pt was also unable to remember access code to phone. Per family, pt's speech became significantly worse, pt unable to say much more than yes or no and words are garbled. Pt unable to tell this RN name, DOB, day, or president. When asked if he knows his name, DOB, day, or president pt adamantly replies with a clear yes. When he attempts to explain, words become garbled and incomprehensible. Family also sts pt is sleeping more than usual.

## 2016-03-10 NOTE — ED Notes (Signed)
Bed: [id number] Expected date:  Expected time:  Means of arrival:  Comments: triage 2/ no bed/monitor

## 2016-03-10 NOTE — Progress Notes (Signed)
*  Preliminary Results* Bilateral lower extremity venous duplex completed. Bilateral lower extremities are negative for deep vein thrombosis. There is no evidence of Baker's cyst bilaterally.  Incidental finding: there is a heterogenous area of the left groin, suggestive of a possible prominent lymph node.  03/10/2016  Maudry Mayhew, RVT, RDCS, RDMS

## 2016-03-10 NOTE — Progress Notes (Signed)
Patient presented to the cancer center requesting to be evaluated. Patient reports he hasn't felt well in a few days. Patient scheduled to follow up with Dr. [name] on 03/23/16 and Shona Simpson, PA on 03/29/16. Patient reports having the sensation of pin and needles in both hands. Reports his ears feel "clogged up" intermittently. Unable to discern if he has ringing in his ears from time to time or not. Reports he has no appetite and hasn't eaten in three days. Reports dry mouth. Denies nausea or vomiting. Reports having a small bowel movement today and one yesterday. Red sclera of both eyes noted. Bulging of left eye noted. Edema of left eyelid present to the point his eye is nearly closed shut. Denies headaches. Reports occasional dizziness. Reports last night his left leg was numbness but, this has since resolved without intervention. Reports blurry vision in both eyes. Edema of both lower extremities noted worse on right than left. Patient reports edema of lower extremities has been present since he started Zytiga May 1st. Denies any pain in his lower extremities. Patient is not presently taking decadron.   Sitting heart rate 104 and bp 166/86 Standing heart rate 115 and bp 156/91

## 2016-03-10 NOTE — Telephone Encounter (Signed)
Sharyn Lull from the vascular lab called and said Mr. Steven Church is negative for DVT in both legs.  The only abnormal results is that a lymph node in his left groin is prominent. Advised her that Steven Church needs to come back to Radiation Oncology.  Notified Joaquim Lai, RN.

## 2016-03-11 ENCOUNTER — Inpatient Hospital Stay (HOSPITAL_COMMUNITY): Payer: Managed Care, Other (non HMO)

## 2016-03-11 ENCOUNTER — Emergency Department (HOSPITAL_COMMUNITY): Payer: Managed Care, Other (non HMO)

## 2016-03-11 ENCOUNTER — Inpatient Hospital Stay (HOSPITAL_COMMUNITY)
Admit: 2016-03-11 | Discharge: 2016-03-11 | Disposition: A | Discharge status: Home / Self Care | Payer: Managed Care, Other (non HMO) | Attending: Internal Medicine | Admitting: Internal Medicine

## 2016-03-11 ENCOUNTER — Encounter (HOSPITAL_COMMUNITY): Payer: Self-pay | Admitting: Internal Medicine

## 2016-03-11 DIAGNOSIS — Y95 Nosocomial condition: Secondary | ICD-10-CM | POA: Diagnosis present

## 2016-03-11 DIAGNOSIS — B954 Other streptococcus as the cause of diseases classified elsewhere: Secondary | ICD-10-CM | POA: Diagnosis present

## 2016-03-11 DIAGNOSIS — C61 Malignant neoplasm of prostate: Secondary | ICD-10-CM | POA: Diagnosis present

## 2016-03-11 DIAGNOSIS — Z8249 Family history of ischemic heart disease and other diseases of the circulatory system: Secondary | ICD-10-CM | POA: Diagnosis not present

## 2016-03-11 DIAGNOSIS — N179 Acute kidney failure, unspecified: Secondary | ICD-10-CM | POA: Diagnosis present

## 2016-03-11 DIAGNOSIS — Z833 Family history of diabetes mellitus: Secondary | ICD-10-CM | POA: Diagnosis not present

## 2016-03-11 DIAGNOSIS — D649 Anemia, unspecified: Secondary | ICD-10-CM

## 2016-03-11 DIAGNOSIS — Z888 Allergy status to other drugs, medicaments and biological substances status: Secondary | ICD-10-CM | POA: Diagnosis not present

## 2016-03-11 DIAGNOSIS — E876 Hypokalemia: Secondary | ICD-10-CM | POA: Diagnosis present

## 2016-03-11 DIAGNOSIS — R471 Dysarthria and anarthria: Secondary | ICD-10-CM | POA: Diagnosis present

## 2016-03-11 DIAGNOSIS — R4701 Aphasia: Secondary | ICD-10-CM

## 2016-03-11 DIAGNOSIS — T368X5A Adverse effect of other systemic antibiotics, initial encounter: Secondary | ICD-10-CM | POA: Diagnosis present

## 2016-03-11 DIAGNOSIS — C7931 Secondary malignant neoplasm of brain: Secondary | ICD-10-CM | POA: Diagnosis present

## 2016-03-11 DIAGNOSIS — G9340 Encephalopathy, unspecified: Secondary | ICD-10-CM

## 2016-03-11 DIAGNOSIS — C7951 Secondary malignant neoplasm of bone: Secondary | ICD-10-CM

## 2016-03-11 DIAGNOSIS — Z79899 Other long term (current) drug therapy: Secondary | ICD-10-CM | POA: Diagnosis not present

## 2016-03-11 DIAGNOSIS — H0520 Unspecified exophthalmos: Secondary | ICD-10-CM | POA: Diagnosis present

## 2016-03-11 DIAGNOSIS — Z923 Personal history of irradiation: Secondary | ICD-10-CM | POA: Diagnosis not present

## 2016-03-11 DIAGNOSIS — Z515 Encounter for palliative care: Secondary | ICD-10-CM | POA: Diagnosis not present

## 2016-03-11 DIAGNOSIS — Z7951 Long term (current) use of inhaled steroids: Secondary | ICD-10-CM | POA: Diagnosis not present

## 2016-03-11 DIAGNOSIS — R748 Abnormal levels of other serum enzymes: Secondary | ICD-10-CM | POA: Diagnosis present

## 2016-03-11 DIAGNOSIS — R4182 Altered mental status, unspecified: Secondary | ICD-10-CM | POA: Diagnosis present

## 2016-03-11 DIAGNOSIS — I10 Essential (primary) hypertension: Secondary | ICD-10-CM

## 2016-03-11 DIAGNOSIS — I509 Heart failure, unspecified: Secondary | ICD-10-CM | POA: Diagnosis not present

## 2016-03-11 DIAGNOSIS — G9341 Metabolic encephalopathy: Secondary | ICD-10-CM | POA: Diagnosis present

## 2016-03-11 DIAGNOSIS — Z87891 Personal history of nicotine dependence: Secondary | ICD-10-CM | POA: Diagnosis not present

## 2016-03-11 DIAGNOSIS — J690 Pneumonitis due to inhalation of food and vomit: Secondary | ICD-10-CM | POA: Diagnosis present

## 2016-03-11 DIAGNOSIS — G936 Cerebral edema: Secondary | ICD-10-CM | POA: Diagnosis present

## 2016-03-11 DIAGNOSIS — Z801 Family history of malignant neoplasm of trachea, bronchus and lung: Secondary | ICD-10-CM | POA: Diagnosis not present

## 2016-03-11 DIAGNOSIS — Z7189 Other specified counseling: Secondary | ICD-10-CM | POA: Diagnosis not present

## 2016-03-11 DIAGNOSIS — J189 Pneumonia, unspecified organism: Secondary | ICD-10-CM

## 2016-03-11 DIAGNOSIS — D630 Anemia in neoplastic disease: Secondary | ICD-10-CM | POA: Diagnosis present

## 2016-03-11 DIAGNOSIS — R591 Generalized enlarged lymph nodes: Secondary | ICD-10-CM | POA: Diagnosis present

## 2016-03-11 LAB — COMPREHENSIVE METABOLIC PANEL
ALK PHOS: 1662 U/L — AB (ref 38–126)
ALT: 17 U/L (ref 17–63)
ALT: 18 U/L (ref 17–63)
ANION GAP: 11 (ref 5–15)
ANION GAP: 12 (ref 5–15)
AST: 27 U/L (ref 15–41)
AST: 31 U/L (ref 15–41)
Albumin: 2.8 g/dL — ABNORMAL LOW (ref 3.5–5.0)
Albumin: 3.3 g/dL — ABNORMAL LOW (ref 3.5–5.0)
Alkaline Phosphatase: 1935 U/L — ABNORMAL HIGH (ref 38–126)
BILIRUBIN TOTAL: 1 mg/dL (ref 0.3–1.2)
BUN: <5 mg/dL — ABNORMAL LOW (ref 6–20)
BUN: <5 mg/dL — ABNORMAL LOW (ref 6–20)
CALCIUM: 6 mg/dL — AB (ref 8.9–10.3)
CHLORIDE: 105 mmol/L (ref 101–111)
CO2: 21 mmol/L — ABNORMAL LOW (ref 22–32)
CO2: 21 mmol/L — AB (ref 22–32)
CREATININE: 0.69 mg/dL (ref 0.61–1.24)
CREATININE: 0.76 mg/dL (ref 0.61–1.24)
Calcium: 6.3 mg/dL — CL (ref 8.9–10.3)
Chloride: 102 mmol/L (ref 101–111)
GLUCOSE: 87 mg/dL (ref 65–99)
Glucose, Bld: 75 mg/dL (ref 65–99)
Potassium: 2.6 mmol/L — CL (ref 3.5–5.1)
Potassium: 2.6 mmol/L — CL (ref 3.5–5.1)
SODIUM: 138 mmol/L (ref 135–145)
Sodium: 134 mmol/L — ABNORMAL LOW (ref 135–145)
Total Bilirubin: 0.8 mg/dL (ref 0.3–1.2)
Total Protein: 6.1 g/dL — ABNORMAL LOW (ref 6.5–8.1)
Total Protein: 6.9 g/dL (ref 6.5–8.1)

## 2016-03-11 LAB — URINE MICROSCOPIC-ADD ON
Bacteria, UA: NONE SEEN
RBC / HPF: NONE SEEN RBC/hpf (ref 0–5)
WBC UA: NONE SEEN WBC/hpf (ref 0–5)

## 2016-03-11 LAB — CBC WITH DIFFERENTIAL/PLATELET
BASOS ABS: 0 10*3/uL (ref 0.0–0.1)
BASOS PCT: 0 %
Basophils Absolute: 0 10*3/uL (ref 0.0–0.1)
Basophils Relative: 0 %
EOS ABS: 0 10*3/uL (ref 0.0–0.7)
EOS ABS: 0.1 10*3/uL (ref 0.0–0.7)
EOS PCT: 0 %
Eosinophils Relative: 1 %
HCT: 26.1 % — ABNORMAL LOW (ref 39.0–52.0)
HEMATOCRIT: 29.4 % — AB (ref 39.0–52.0)
HEMOGLOBIN: 8.9 g/dL — AB (ref 13.0–17.0)
HEMOGLOBIN: 10 g/dL — AB (ref 13.0–17.0)
LYMPHS ABS: 1.9 10*3/uL (ref 0.7–4.0)
LYMPHS ABS: 1.9 10*3/uL (ref 0.7–4.0)
Lymphocytes Relative: 20 %
Lymphocytes Relative: 21 %
MCH: 27.5 pg (ref 26.0–34.0)
MCH: 28 pg (ref 26.0–34.0)
MCHC: 34 g/dL (ref 30.0–36.0)
MCHC: 34.1 g/dL (ref 30.0–36.0)
MCV: 80.8 fL (ref 78.0–100.0)
MCV: 82.1 fL (ref 78.0–100.0)
MONOS PCT: 10 %
Monocytes Absolute: 1 10*3/uL (ref 0.1–1.0)
Monocytes Absolute: 1 10*3/uL (ref 0.1–1.0)
Monocytes Relative: 11 %
NEUTROS ABS: 6.5 10*3/uL (ref 1.7–7.7)
NEUTROS PCT: 68 %
Neutro Abs: 6.2 10*3/uL (ref 1.7–7.7)
Neutrophils Relative %: 69 %
PLATELETS: 289 10*3/uL (ref 150–400)
Platelets: 350 10*3/uL (ref 150–400)
RBC: 3.18 MIL/uL — AB (ref 4.22–5.81)
RBC: 3.64 MIL/uL — AB (ref 4.22–5.81)
RDW: 18 % — ABNORMAL HIGH (ref 11.5–15.5)
RDW: 18.1 % — ABNORMAL HIGH (ref 11.5–15.5)
WBC: 9.1 10*3/uL (ref 4.0–10.5)
WBC: 9.4 10*3/uL (ref 4.0–10.5)

## 2016-03-11 LAB — RAPID URINE DRUG SCREEN, HOSP PERFORMED
AMPHETAMINES: NOT DETECTED
BENZODIAZEPINES: NOT DETECTED
Barbiturates: NOT DETECTED
COCAINE: NOT DETECTED
OPIATES: NOT DETECTED
TETRAHYDROCANNABINOL: NOT DETECTED

## 2016-03-11 LAB — BLOOD CULTURE ID PANEL (REFLEXED)
Acinetobacter baumannii: NOT DETECTED
CANDIDA KRUSEI: NOT DETECTED
CANDIDA PARAPSILOSIS: NOT DETECTED
CANDIDA TROPICALIS: NOT DETECTED
CARBAPENEM RESISTANCE: NOT DETECTED
Candida albicans: NOT DETECTED
Candida glabrata: NOT DETECTED
ENTEROBACTERIACEAE SPECIES: NOT DETECTED
Enterobacter cloacae complex: NOT DETECTED
Enterococcus species: NOT DETECTED
Escherichia coli: NOT DETECTED
Haemophilus influenzae: NOT DETECTED
KLEBSIELLA OXYTOCA: NOT DETECTED
KLEBSIELLA PNEUMONIAE: NOT DETECTED
Listeria monocytogenes: NOT DETECTED
Methicillin resistance: NOT DETECTED
Neisseria meningitidis: NOT DETECTED
PROTEUS SPECIES: NOT DETECTED
PSEUDOMONAS AERUGINOSA: NOT DETECTED
STAPHYLOCOCCUS AUREUS BCID: NOT DETECTED
STREPTOCOCCUS AGALACTIAE: NOT DETECTED
Serratia marcescens: NOT DETECTED
Staphylococcus species: NOT DETECTED
Streptococcus pneumoniae: NOT DETECTED
Streptococcus pyogenes: NOT DETECTED
Streptococcus species: DETECTED — AB
VANCOMYCIN RESISTANCE: NOT DETECTED

## 2016-03-11 LAB — URINALYSIS, ROUTINE W REFLEX MICROSCOPIC
BILIRUBIN URINE: NEGATIVE
GLUCOSE, UA: NEGATIVE mg/dL
Leukocytes, UA: NEGATIVE
Nitrite: NEGATIVE
PROTEIN: NEGATIVE mg/dL
Specific Gravity, Urine: 1.014 (ref 1.005–1.030)
pH: 6 (ref 5.0–8.0)

## 2016-03-11 LAB — AMMONIA: AMMONIA: 19 umol/L (ref 9–35)

## 2016-03-11 LAB — MAGNESIUM
MAGNESIUM: 1.6 mg/dL — AB (ref 1.7–2.4)
MAGNESIUM: 2 mg/dL (ref 1.7–2.4)

## 2016-03-11 LAB — BASIC METABOLIC PANEL
ANION GAP: 8 (ref 5–15)
CHLORIDE: 104 mmol/L (ref 101–111)
CO2: 24 mmol/L (ref 22–32)
Calcium: 5.7 mg/dL — CL (ref 8.9–10.3)
Creatinine, Ser: 0.71 mg/dL (ref 0.61–1.24)
Glucose, Bld: 117 mg/dL — ABNORMAL HIGH (ref 65–99)
POTASSIUM: 2.9 mmol/L — AB (ref 3.5–5.1)
SODIUM: 136 mmol/L (ref 135–145)

## 2016-03-11 LAB — LIPASE, BLOOD: Lipase: 17 U/L (ref 11–51)

## 2016-03-11 LAB — STREP PNEUMONIAE URINARY ANTIGEN: Strep Pneumo Urinary Antigen: NEGATIVE

## 2016-03-11 LAB — LACTIC ACID, PLASMA: Lactic Acid, Venous: 0.7 mmol/L (ref 0.5–2.0)

## 2016-03-11 LAB — PROCALCITONIN: Procalcitonin: <0.1 ng/mL

## 2016-03-11 MED ORDER — DEXTROSE 5 % IV SOLN: 500.0000 mg | INTRAVENOUS | Status: DC

## 2016-03-11 MED ORDER — SODIUM CHLORIDE 0.9 % IV SOLN
2.0000 g | Freq: Once | INTRAVENOUS | Status: DC
  Filled 2016-03-11: qty 20

## 2016-03-11 MED ORDER — MAGNESIUM SULFATE 2 GM/50ML IV SOLN
2.0000 g | Freq: Once | INTRAVENOUS | Status: AC
  Administered 2016-03-11: 2 g via INTRAVENOUS
  Filled 2016-03-11: qty 50

## 2016-03-11 MED ORDER — HYDRALAZINE HCL 20 MG/ML IJ SOLN: 10.0000 mg | INTRAMUSCULAR | Status: DC | PRN

## 2016-03-11 MED ORDER — SODIUM CHLORIDE 0.9 % IV SOLN
4.0000 g | Freq: Once | INTRAVENOUS | Status: AC
  Administered 2016-03-11: 4 g via INTRAVENOUS
  Filled 2016-03-11: qty 40

## 2016-03-11 MED ORDER — CALCITRIOL 0.25 MCG PO CAPS
0.2500 ug | ORAL_CAPSULE | Freq: Every day | ORAL | Status: DC
  Filled 2016-03-11: qty 1

## 2016-03-11 MED ORDER — FLUTICASONE PROPIONATE 50 MCG/ACT NA SUSP
2.0000 | Freq: Every day | NASAL | Status: DC | PRN
  Filled 2016-03-11: qty 16

## 2016-03-11 MED ORDER — ENOXAPARIN SODIUM 40 MG/0.4ML ~~LOC~~ SOLN
40.0000 mg | SUBCUTANEOUS | Status: DC
  Administered 2016-03-11 – 2016-03-15 (×5): 40 mg via SUBCUTANEOUS
  Filled 2016-03-11 (×4): qty 0.4

## 2016-03-11 MED ORDER — POTASSIUM CHLORIDE 10 MEQ/100ML IV SOLN
10.0000 meq | Freq: Once | INTRAVENOUS | Status: DC
  Filled 2016-03-11: qty 100

## 2016-03-11 MED ORDER — SODIUM CHLORIDE 0.9 % IV SOLN
INTRAVENOUS | Status: DC
  Administered 2016-03-11: 07:00:00 via INTRAVENOUS

## 2016-03-11 MED ORDER — DEXTROSE-NACL 5-0.45 % IV SOLN
INTRAVENOUS | Status: DC
  Administered 2016-03-11: 07:00:00 via INTRAVENOUS

## 2016-03-11 MED ORDER — DEXTROSE 5 % IV SOLN
1.0000 g | Freq: Once | INTRAVENOUS | Status: AC
  Administered 2016-03-11: 1 g via INTRAVENOUS
  Filled 2016-03-11: qty 10

## 2016-03-11 MED ORDER — ACETAMINOPHEN 325 MG PO TABS: 650.0000 mg | ORAL_TABLET | Freq: Once | ORAL | Status: DC

## 2016-03-11 MED ORDER — SODIUM CHLORIDE 0.9 % IV SOLN: 3.0000 g | Freq: Once | INTRAVENOUS | Status: DC

## 2016-03-11 MED ORDER — POTASSIUM CHLORIDE 10 MEQ/100ML IV SOLN
10.0000 meq | INTRAVENOUS | Status: AC
  Administered 2016-03-11 (×3): 10 meq via INTRAVENOUS
  Filled 2016-03-11 (×3): qty 100

## 2016-03-11 MED ORDER — METOPROLOL TARTRATE 5 MG/5ML IV SOLN
5.0000 mg | Freq: Three times a day (TID) | INTRAVENOUS | Status: DC
  Administered 2016-03-11 – 2016-03-16 (×15): 5 mg via INTRAVENOUS
  Filled 2016-03-11 (×16): qty 5

## 2016-03-11 MED ORDER — POTASSIUM CHLORIDE 10 MEQ/100ML IV SOLN
10.0000 meq | Freq: Once | INTRAVENOUS | Status: DC
  Administered 2016-03-11: 10 meq via INTRAVENOUS
  Filled 2016-03-11: qty 100

## 2016-03-11 MED ORDER — CEFTRIAXONE SODIUM 1 G IJ SOLR: 1.0000 g | INTRAMUSCULAR | Status: DC

## 2016-03-11 MED ORDER — DEXAMETHASONE SODIUM PHOSPHATE 4 MG/ML IJ SOLN
4.0000 mg | Freq: Four times a day (QID) | INTRAMUSCULAR | Status: DC
  Administered 2016-03-11 – 2016-03-13 (×8): 4 mg via INTRAVENOUS
  Filled 2016-03-11 (×8): qty 1

## 2016-03-11 MED ORDER — PIPERACILLIN-TAZOBACTAM 3.375 G IVPB
3.3750 g | Freq: Three times a day (TID) | INTRAVENOUS | Status: DC
  Administered 2016-03-11 – 2016-03-13 (×7): 3.375 g via INTRAVENOUS
  Filled 2016-03-11 (×9): qty 50

## 2016-03-11 MED ORDER — CETYLPYRIDINIUM CHLORIDE 0.05 % MT LIQD
7.0000 mL | Freq: Two times a day (BID) | OROMUCOSAL | Status: DC
  Administered 2016-03-11 – 2016-03-16 (×9): 7 mL via OROMUCOSAL

## 2016-03-11 MED ORDER — DEXTROSE 5 % IV SOLN
500.0000 mg | Freq: Once | INTRAVENOUS | Status: DC
  Administered 2016-03-11: 500 mg via INTRAVENOUS
  Filled 2016-03-11: qty 500

## 2016-03-11 MED ORDER — SODIUM CHLORIDE 0.9 % IV SOLN
500.0000 mg | Freq: Two times a day (BID) | INTRAVENOUS | Status: DC
  Filled 2016-03-11: qty 5

## 2016-03-11 MED ORDER — VANCOMYCIN HCL IN DEXTROSE 1-5 GM/200ML-% IV SOLN
1000.0000 mg | Freq: Three times a day (TID) | INTRAVENOUS | Status: DC
  Administered 2016-03-11 – 2016-03-12 (×4): 1000 mg via INTRAVENOUS
  Filled 2016-03-11 (×5): qty 200

## 2016-03-11 MED ORDER — POTASSIUM CHLORIDE 10 MEQ/100ML IV SOLN
10.0000 meq | Freq: Once | INTRAVENOUS | Status: AC
  Administered 2016-03-11: 10 meq via INTRAVENOUS

## 2016-03-11 MED ORDER — DEXTROSE 5 % IV SOLN
930.0000 mg | Freq: Three times a day (TID) | INTRAVENOUS | Status: DC
  Administered 2016-03-11: 930 mg via INTRAVENOUS
  Filled 2016-03-11 (×2): qty 18.6

## 2016-03-11 MED ORDER — CALCIUM CARBONATE ANTACID 500 MG PO CHEW: 800.0000 mg | CHEWABLE_TABLET | Freq: Two times a day (BID) | ORAL | Status: DC

## 2016-03-11 MED ORDER — POTASSIUM CHLORIDE 10 MEQ/100ML IV SOLN
10.0000 meq | INTRAVENOUS | Status: AC
  Administered 2016-03-11 (×5): 10 meq via INTRAVENOUS
  Filled 2016-03-11 (×5): qty 100

## 2016-03-11 NOTE — Care Management Note (Signed)
Case Management Note  Patient Details  Name: Steven SLAYBAUGH Sr. MRN: [id number] Date of Birth: 1951-01-03  Subjective/Objective: 65 y/o m admitted [id number] Prostate Ca, bony mets,CNS mets. From home w/spouse.Noted poor prognosis.Palliative cons-await recc.                   Action/Plan:d/c plan home.   Expected Discharge Date:   (UNKNOWN)               Expected Discharge Plan:  [place]  In-House Referral:     Discharge planning Services  CM Consult  Post Acute Care Choice:    Choice offered to:     DME Arranged:    DME Agency:     HH Arranged:    [place] Agency:     Status of Service:  In process, will continue to follow  Medicare Important Message Given:    Date Medicare IM Given:    Medicare IM give by:    Date Additional Medicare IM Given:    Additional Medicare Important Message give by:     If discussed at [place] of Stay Meetings, dates discussed:    Additional Comments:  Dessa Phi, RN 03/11/2016, 1:44 PM

## 2016-03-11 NOTE — Evaluation (Addendum)
SLP Cancellation Note  Patient Details Name: Steven Church Sr. MRN: [id number] DOB: 10/25/1950   Cancelled treatment:       Reason Eval/Treat Not Completed: Other (comment);Fatigue/lethargy limiting ability to participate (spoke to RN who reports pt remains lethargic, will plan to proceed with MBS next date if pt alert, MBS indicated due to neuro diagnosis and concern for asp pna )   Note palliative referral pending and oncology note.  SLP to follow up for desire/indication for MBS.   Luanna Salk, [place] [facility] SLP [phone]

## 2016-03-11 NOTE — Progress Notes (Addendum)
PROGRESS NOTE        PATIENT DETAILS Name: Steven GEHLHAUSEN Sr. Age: 65 y.o. Sex: male Date of Birth: [birth year] Admit Date: 03/10/2016 Admitting Physician Rise Patience, MD [facility]:[id number], DAVID Audelia Acton, PA-C Outpatient Specialists:Dr [name], Dr Tammi Klippel  Brief Narrative: Patient is a 65 y.o. 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 ++ Coughing-but no pooling of secretions.  Assessment/Plan: Principal Problem: Acute encephalopathy: Multifactorial- metabolic encephalopathy from PNA, and dural/brain metastasis. NOT confused this am-following all my commands-main issue seems to be expressive aphasia.CT head results noted, will await MRI Brain-if shows significant intra-cranial tumor burden will need to be restarted on Decadron (just tapered off recently as outpatient). Await EEG, if needed will obtain Neurology evaluation.  Addendum 2:50 pm MRI brain neg for acute infarct, shows decreased tumor burden-EEG pending-Neuro consulted  Active Problems: Aphasia: mostly expressive-able to say a few words-at times appears dysarthric as well. Suspect due to intracranial mets-but could have a small CVA as well-await MRI Brain/EEG. Will likely need initiation of IV Decradron depending on MRI Brain results. Non Focal exam-follows commands.  HCAP/Aspiration PNA: Continue empiric Vanco/Zosyn. Afebrile-no leukocytosis-does not appear acutely ill. Follow cultures and urine strep/legionella antigen. Await SLP eval-keep NPO till then.  Hypokalemia:being repleted-recheck later today  Hypomagnesemia:replete and recheck in am  Hypocalcemia:will check PTH/Vit D levels-start calcitriol and oral calcium supplementation when oral intake has resumed,  for now give one dose of IV Calcium GLuconate and follow.   Anemia: likely related to malignancy/chronic disease. Follow closely  LHT:[id number] scheduled IV Lopressor and use prn IV hydralazine. Hold all oral anti-hypertensive's till seen by Speech therapy   Elevated Alk Phosphatase: chronic issue-likely due to bone mets-abdomen is soft and non tender today  Advanced prostate cancer with diffuse bony mets/bulky lymphadenopathy/dural mets: followed by Dr [name] and Dr Tammi Klippel. Recently completed whole brain radiation. On Zytiga. Case d/w Dr [name] over the phone, advises waiting for MRI Brain and starting decadron if indicated. May need to touch base with Rad Onc-Dr Tammi Klippel as well.   DVT Prophylaxis: Begin Prophylactic Lovenos  Code Status: Full code   Family Communication: Sister, step daughter at bedside  Disposition Plan: Remain inpatient-suspect requires 2-3 days more of inpatient hospitalization  Antimicrobial agents: IV Vancomycin 5/26>> IV Zosyn 5/26>>  Procedures: None  CONSULTS:  None  Time spent: 25 minutes-Greater than 50% of this time was spent in counseling, explanation of diagnosis, planning of further management, and coordination of care.  MEDICATIONS: Anti-infectives    Start     Dose/Rate Route Frequency Ordered Stop   03/12/16 0600  azithromycin (ZITHROMAX) 500 mg in dextrose 5 % 250 mL IVPB  Status:  Discontinued     500 mg 250 mL/hr over 60 Minutes Intravenous Every 24 hours 03/11/16 0625 03/11/16 0643   03/11/16 2200  cefTRIAXone (ROCEPHIN) 1 g in dextrose 5 % 50 mL IVPB  Status:  Discontinued     1 g 100 mL/hr over 30 Minutes Intravenous Every 24 hours 03/11/16 0626 03/11/16 0643   03/11/16 0645  vancomycin (VANCOCIN) IVPB 1000 mg/200 mL premix     1,000 mg 200 mL/hr over 60 Minutes Intravenous Every 8 hours 03/11/16 0643  03/11/16 0645  piperacillin-tazobactam (ZOSYN) IVPB 3.375 g     3.375 g 12.5 mL/hr over 240 Minutes Intravenous  Every 8 hours 03/11/16 0643     03/11/16 0430  acyclovir (ZOVIRAX) 930 mg in dextrose 5 % 150 mL IVPB  Status:  Discontinued     930 mg 168.6 mL/hr over 60 Minutes Intravenous Every 8 hours 03/11/16 0335 03/11/16 0614   03/11/16 0130  cefTRIAXone (ROCEPHIN) 1 g in dextrose 5 % 50 mL IVPB     1 g 100 mL/hr over 30 Minutes Intravenous  Once 03/11/16 0123 03/11/16 0258   03/11/16 0130  azithromycin (ZITHROMAX) 500 mg in dextrose 5 % 250 mL IVPB  Status:  Discontinued     500 mg 250 mL/hr over 60 Minutes Intravenous  Once 03/11/16 0123 03/11/16 0755      Scheduled Meds: . antiseptic oral rinse  7 mL Mouth Rinse BID  . dextrose 5 % and 0.45% NaCl   Intravenous STAT  . piperacillin-tazobactam (ZOSYN)  IV  3.375 g Intravenous Q8H  . potassium chloride  10 mEq Intravenous Once  . potassium chloride  10 mEq Intravenous Q1 Hr x 5  . vancomycin  1,000 mg Intravenous Q8H   Continuous Infusions: . sodium chloride 75 mL/hr at 03/11/16 0700   PRN Meds:.fluticasone, hydrALAZINE   PHYSICAL EXAM: Vital signs: Filed Vitals:   03/11/16 0149 03/11/16 0212 03/11/16 0255 03/11/16 0433  BP:   140/73 182/91  Pulse:   105 104  Temp: 100 F (37.8 C) 102.3 F (39.1 C)  99.7 F (37.6 C)  TempSrc:  Rectal  Oral  Resp:   25 24  Height:      Weight:      SpO2:   94% 93%   Filed Weights   03/10/16 2327  Weight: 92.987 kg (205 lb)   Body mass index is 28.6 kg/(m^2).   Gen Exam: Awake and alert-follows all commands- has mostly expressive aphasia   Neck: Supple, No JVD.   Chest: B/L Clear.   CVS: S1 S2 Regular, no murmurs.  Abdomen: soft, BS +, non tender, non distended.  Extremities: no edema, lower extremities warm to touch. Neurologic: Non Focal.   Skin: No Rash or lesions   Wounds: N/A.    LABORATORY DATA: CBC:  Recent Labs Lab 03/04/16 1125 03/10/16 2342 03/11/16 0643  WBC 5.6 9.4 9.1  NEUTROABS 3.3 6.5 6.2  HGB 9.4* 10.0* 8.9*  HCT 28.5* 29.4* 26.1*  MCV 83.6 80.8 82.1  PLT  224 350 785    Basic Metabolic Panel:  Recent Labs Lab 03/04/16 1125 03/10/16 2342 03/11/16 0643  NA 144 138 134*  K 2.6* 2.6* 2.6*  CL  --  105 102  CO2 23 21* 21*  GLUCOSE 84 75 87  BUN 8.7 <5* <5*  CREATININE 1.0 0.76 0.69  CALCIUM 9.2 6.3* 6.0*  MG  --  1.6* 2.0    GFR: Estimated Creatinine Clearance: 105.9 mL/min (by C-G formula based on Cr of 0.69).  Liver Function Tests:  Recent Labs Lab 03/04/16 1125 03/10/16 2342 03/11/16 0643  AST 35* 31 27  ALT '26 18 17  ' ALKPHOS 2,137* 1935* 1662*  BILITOT 0.51 0.8 1.0  PROT 6.1* 6.9 6.1*  ALBUMIN 2.7* 3.3* 2.8*    Recent Labs Lab 03/11/16 0118  LIPASE 17    Recent Labs Lab 03/11/16 0118  AMMONIA 19    Coagulation Profile: No results for input(s): INR, PROTIME in the last 168 hours.  Cardiac Enzymes:  No results for input(s): CKTOTAL, CKMB, CKMBINDEX, TROPONINI in the last 168 hours.  BNP (last 3 results) No results for input(s): PROBNP in the last 8760 hours.  HbA1C: No results for input(s): HGBA1C in the last 72 hours.  CBG: No results for input(s): GLUCAP in the last 168 hours.  Lipid Profile: No results for input(s): CHOL, HDL, LDLCALC, TRIG, CHOLHDL, LDLDIRECT in the last 72 hours.  Thyroid Function Tests: No results for input(s): TSH, T4TOTAL, FREET4, T3FREE, THYROIDAB in the last 72 hours.  Anemia Panel: No results for input(s): VITAMINB12, FOLATE, FERRITIN, TIBC, IRON, RETICCTPCT in the last 72 hours.  Urine analysis:    Component Value Date/Time   COLORURINE YELLOW 03/11/2016 0146   APPEARANCEUR CLEAR 03/11/2016 0146   LABSPEC 1.014 03/11/2016 0146   PHURINE 6.0 03/11/2016 0146   GLUCOSEU NEGATIVE 03/11/2016 0146   HGBUR TRACE* 03/11/2016 0146   BILIRUBINUR NEGATIVE 03/11/2016 0146   BILIRUBINUR neg 04/24/2015 1509   KETONESUR >80* 03/11/2016 0146   PROTEINUR NEGATIVE 03/11/2016 0146   PROTEINUR neg 04/24/2015 1509   UROBILINOGEN negative 04/24/2015 1509   NITRITE NEGATIVE  03/11/2016 0146   NITRITE neg 04/24/2015 1509   LEUKOCYTESUR NEGATIVE 03/11/2016 0146    Sepsis Labs: Lactic Acid, Venous    Component Value Date/Time   LATICACIDVEN 0.7 03/11/2016 0635    MICROBIOLOGY: No results found for this or any previous visit (from the past 240 hour(s)).  RADIOLOGY STUDIES/RESULTS: Dg Chest 2 View  03/11/2016  CLINICAL DATA:  Acute onset of confusion and lethargy. Current history of pancreatic cancer. Initial encounter. EXAM: CHEST  2 VIEW COMPARISON:  Chest radiograph performed 02/27/2015, and PET/CT performed 03/20/2015 FINDINGS: The lungs are well-aerated. Right peripheral and left midlung airspace opacities raise concern for pneumonia. Vascular congestion is noted. A small left pleural effusion is noted. No pneumothorax is seen. The heart is borderline normal in size. No acute osseous abnormalities are seen. IMPRESSION: Right peripheral and left midlung airspace opacities raise concern for multifocal pneumonia. Vascular congestion and small left pleural effusion noted. Followup PA and lateral chest X-ray is recommended in 3-4 weeks following trial of antibiotic therapy to ensure resolution and exclude underlying malignancy. Electronically Signed   By: Garald Balding M.D.   On: 03/11/2016 00:50   Ct Head Wo Contrast  03/11/2016  CLINICAL DATA:  Acute onset of slurred speech. Known brain metastases from pancreatic cancer. Initial encounter. EXAM: CT HEAD WITHOUT CONTRAST TECHNIQUE: Contiguous axial images were obtained from the base of the skull through the vertex without intravenous contrast. COMPARISON:  MRI of the brain performed 01/22/2016 FINDINGS: There is no evidence of acute infarction, or intra- or extra-axial hemorrhage on CT. A large partially calcified dural/epidural soft tissue mass is again noted along the left cerebral convexity, with two lobulations each measuring up to 3.6 cm. Approximately 6 mm of rightward midline shift is seen; the degree of midline  shift appears somewhat improved. Additional tumor anterior to the left temporal lobe is partially characterized. Additional known tumors are not well seen on CT. Mild subcortical white matter change likely reflects small vessel ischemic microangiopathy. The brainstem and fourth ventricle are within normal limits. The basal ganglia are unremarkable in appearance. There is no evidence of fracture; visualized osseous structures are unremarkable in appearance. The orbits are within normal limits. The paranasal sinuses and mastoid air cells are well-aerated. No significant soft tissue abnormalities are seen. IMPRESSION: 1. Large partially calcified dural/epidural soft tissue masses again noted. The largest, along the left cerebral  convexity, has two lobulations, each measuring up to 3.6 cm. 6 mm of rightward midline shift noted, somewhat improved from the prior MRI. 2. Additional known tumors are not well seen on noncontrast CT. 3. Mild small vessel ischemic microangiopathy. Electronically Signed   By: Garald Balding M.D.   On: 03/11/2016 01:54     LOS: 0 days   Oren Binet, MD  [facility] Pager:[phone]  If 7PM-7AM, please contact night-coverage [email or web address] Password [email or web address] 03/11/2016, 11:00 AM

## 2016-03-11 NOTE — Progress Notes (Signed)
PHARMACY - PHYSICIAN COMMUNICATION CRITICAL VALUE ALERT - BLOOD CULTURE IDENTIFICATION (BCID)  Results for orders placed or performed during the hospital encounter of 03/10/16  Blood Culture ID Panel (Reflexed) (Collected: 03/10/2016 11:42 PM)  Result Value Ref Range   Enterococcus species NOT DETECTED NOT DETECTED   Vancomycin resistance NOT DETECTED NOT DETECTED   Listeria monocytogenes NOT DETECTED NOT DETECTED   Staphylococcus species NOT DETECTED NOT DETECTED   Staphylococcus aureus NOT DETECTED NOT DETECTED   Methicillin resistance NOT DETECTED NOT DETECTED   Streptococcus species DETECTED (A) NOT DETECTED   Streptococcus agalactiae NOT DETECTED NOT DETECTED   Streptococcus pneumoniae NOT DETECTED NOT DETECTED   Streptococcus pyogenes NOT DETECTED NOT DETECTED   Acinetobacter baumannii NOT DETECTED NOT DETECTED   Enterobacteriaceae species NOT DETECTED NOT DETECTED   Enterobacter cloacae complex NOT DETECTED NOT DETECTED   Escherichia coli NOT DETECTED NOT DETECTED   Klebsiella oxytoca NOT DETECTED NOT DETECTED   Klebsiella pneumoniae NOT DETECTED NOT DETECTED   Proteus species NOT DETECTED NOT DETECTED   Serratia marcescens NOT DETECTED NOT DETECTED   Carbapenem resistance NOT DETECTED NOT DETECTED   Haemophilus influenzae NOT DETECTED NOT DETECTED   Neisseria meningitidis NOT DETECTED NOT DETECTED   Pseudomonas aeruginosa NOT DETECTED NOT DETECTED   Candida albicans NOT DETECTED NOT DETECTED   Candida glabrata NOT DETECTED NOT DETECTED   Candida krusei NOT DETECTED NOT DETECTED   Candida parapsilosis NOT DETECTED NOT DETECTED   Candida tropicalis NOT DETECTED NOT DETECTED    Name of physician (or Provider) Contacted: Walden Field  Changes to prescribed antibiotics required: None  Romeo Rabon, PharmD, pager [phone]. 03/11/2016,8:32 PM.

## 2016-03-11 NOTE — Progress Notes (Signed)
IP PROGRESS NOTE  Subjective:   Patient well-known to me with advanced prostate cancer currently receiving Zytiga for palliative purposes. He also has CNS disease and status post radiation therapy. He presented acutely on 03/11/2016 although alternative mental status and speech difficulties. MRI and CT scan did not show any acute findings but continued to have persistent mass effect and midline shift. Clinically, he reports no specific symptoms although his speech is significantly worse compared to 03/04/2016 when he was seen in clinic. No specific deficits noted.   Objective:  Vital signs in last 24 hours: Temp:  [98.1 F (36.7 C)-102.3 F (39.1 C)] 99.7 F (37.6 C) (05/26 0433) Pulse Rate:  [97-117] 104 (05/26 0433) Resp:  [18-28] 24 (05/26 0433) BP: (140-183)/(73-107) 182/91 mmHg (05/26 0433) SpO2:  [93 %-100 %] 93 % (05/26 0433) Weight:  [205 lb (92.987 kg)] 205 lb (92.987 kg) (05/25 2327) Weight change:  Last BM Date:  (unknown)  Intake/Output from previous day: 05/25 0701 - 05/26 0700 In: 360.4 [I.V.:10.4; IV Piggyback:350] Out: 750 [Urine:750]  Mouth: mucous membranes moist, pharynx normal without lesions Resp: clear to auscultation bilaterally Cardio: regular rate and rhythm, S1, S2 normal, no murmur, click, rub or gallop GI: soft, non-tender; bowel sounds normal; no masses,  no organomegaly Extremities: extremities normal, atraumatic, no cyanosis or edema Neurological exam: No focal deficits noted. Speech is garbled.   Lab Results:  Recent Labs  03/10/16 2342 03/11/16 0643  WBC 9.4 9.1  HGB 10.0* 8.9*  HCT 29.4* 26.1*  PLT 350 289    BMET  Recent Labs  03/10/16 2342 03/11/16 0643  NA 138 134*  K 2.6* 2.6*  CL 105 102  CO2 21* 21*  GLUCOSE 75 87  BUN <5* <5*  CREATININE 0.76 0.69  CALCIUM 6.3* 6.0*    Studies/Results: Dg Chest 2 View  03/11/2016  CLINICAL DATA:  Acute onset of confusion and lethargy. Current history of pancreatic cancer. Initial  encounter. EXAM: CHEST  2 VIEW COMPARISON:  Chest radiograph performed 02/27/2015, and PET/CT performed 03/20/2015 FINDINGS: The lungs are well-aerated. Right peripheral and left midlung airspace opacities raise concern for pneumonia. Vascular congestion is noted. A small left pleural effusion is noted. No pneumothorax is seen. The heart is borderline normal in size. No acute osseous abnormalities are seen. IMPRESSION: Right peripheral and left midlung airspace opacities raise concern for multifocal pneumonia. Vascular congestion and small left pleural effusion noted. Followup PA and lateral chest X-ray is recommended in 3-4 weeks following trial of antibiotic therapy to ensure resolution and exclude underlying malignancy. Electronically Signed   By: Garald Balding M.D.   On: 03/11/2016 00:50   Ct Head Wo Contrast  03/11/2016  CLINICAL DATA:  Acute onset of slurred speech. Known brain metastases from pancreatic cancer. Initial encounter. EXAM: CT HEAD WITHOUT CONTRAST TECHNIQUE: Contiguous axial images were obtained from the base of the skull through the vertex without intravenous contrast. COMPARISON:  MRI of the brain performed 01/22/2016 FINDINGS: There is no evidence of acute infarction, or intra- or extra-axial hemorrhage on CT. A large partially calcified dural/epidural soft tissue mass is again noted along the left cerebral convexity, with two lobulations each measuring up to 3.6 cm. Approximately 6 mm of rightward midline shift is seen; the degree of midline shift appears somewhat improved. Additional tumor anterior to the left temporal lobe is partially characterized. Additional known tumors are not well seen on CT. Mild subcortical white matter change likely reflects small vessel ischemic microangiopathy. The brainstem and fourth  ventricle are within normal limits. The basal ganglia are unremarkable in appearance. There is no evidence of fracture; visualized osseous structures are unremarkable in  appearance. The orbits are within normal limits. The paranasal sinuses and mastoid air cells are well-aerated. No significant soft tissue abnormalities are seen. IMPRESSION: 1. Large partially calcified dural/epidural soft tissue masses again noted. The largest, along the left cerebral convexity, has two lobulations, each measuring up to 3.6 cm. 6 mm of rightward midline shift noted, somewhat improved from the prior MRI. 2. Additional known tumors are not well seen on noncontrast CT. 3. Mild small vessel ischemic microangiopathy. Electronically Signed   By: Garald Balding M.D.   On: 03/11/2016 01:54   Mr Brain Wo Contrast  03/11/2016  CLINICAL DATA:  65 year old hypertensive male with history of prostate cancer presenting with confusion and acute onset of slurred speech. Completion of brain radiation therapy April of 2017. Subsequent encounter. EXAM: MRI HEAD WITHOUT CONTRAST TECHNIQUE: Multiplanar, multiecho pulse sequences of the brain and surrounding structures were obtained without intravenous contrast. COMPARISON:  03/11/2016 CT.  01/22/2016 MR. FINDINGS: The present exam is motion degraded with only 5 sequences were able to be obtained prior to patient aborting examination. No acute infarct or obvious intracranial hemorrhage. Osseous metastatic disease. Prominent left hemisphere dural extension with mass effect upon left hemisphere has improved when compared to the 01/22/2016 examination. Maximal thickness of left hemispheric dural extension 1.9 cm versus prior 2.7 cm. There remains mass-effect upon the left lateral ventricle with midline shift to the 5.6 mm midline shift to the right versus prior 11.7 mm of midline shift the right. IMPRESSION: Limited exam as noted above without evidence of acute infarct. When compared to the 01/22/2016 examination, there has been interval improvement although incomplete resolution of significant left hemispheric dural extension of metastatic disease with continued mass  effect upon the left lateral ventricle and midline shift to the right (currently 5.6 mm midline shift to the right versus prior 11.7 mm midline shift to the right). Electronically Signed   By: Genia Del M.D.   On: 03/11/2016 11:40    Medications: I have reviewed the patient's current medications.  Assessment/Plan:  65 year old gentleman with the following issues:  1. Advanced prostate cancer presenting with diffuse bony metastasis and bulky lymphadenopathy and PSA of 899.8. This is biopsy proven to be prostate cancer.   He is n Lupron started in June 2016 but have developed castration resistant disease with a PSA rise up to 702.5 As well as brain metastasis.  He is currently on Zytiga and have tolerated it well. His PSA continues to respond with declined down to 144 from 702. I have recommended continuing this medication without dose reduction or underlying. The goal of this therapy is palliative and certainly not curative at this time.  2. Altered mental status: Related to his CNS metastasis. His MRI on 03/11/2016 was reviewed which showed no evidence of infarct. He continues to have significant left dural extension of his metastatic disease with mass effect and midline shift to the right. It is reasonable to resume his steroids at this time that his worsening neurological symptoms. I do not believe any further radiation therapy is indicated or can be done.  3. Prognosis is poor with limited life expectancy. Therapy goal is palliative and if his clinical status does not improve with dexamethasone, palliative medicine service involvement would be appropriate to define goals of care if he experience clinical decline.  Please call with any questions regarding the  care of this pleasant gentleman.   LOS: 0 days   [id number] 03/11/2016, 1:23 PM

## 2016-03-11 NOTE — Progress Notes (Signed)
Initial Nutrition Assessment  DOCUMENTATION CODES:   Not applicable  INTERVENTION:  -Ensure Enlive po BID, each supplement provides 350 kcal and 20 grams of protein with diet advancement per SLP -RD to continue to monitor -Diet advancement per MD/SLP  NUTRITION DIAGNOSIS:   Inadequate oral intake related to lethargy/confusion, poor appetite as evidenced by per patient/family report.  GOAL:   Patient will meet greater than or equal to 90% of their needs  MONITOR:   PO intake, Labs, I & O's, Skin, Supplement acceptance  REASON FOR ASSESSMENT:   Malnutrition Screening Tool    ASSESSMENT:   Steven Church Sr. is a 65 y.o. male with medical history significant of metastatic prostate cancer stage IV status post cranial radiation last month on Zytiga was brought to the ER the patient was found to be confused last evening at home by patient's family.  Spoke with Mr. Hirschman at bedside. He demonstrates some verbal aphasia but was ok for yes or no questions.   Per chart, he had not eaten for 3 days PTA.  Pt states prior to that stent, he was eating ok. He denies weight loss.  Per chart his wt has fluctuated between 205-223 over the past 3 months.  He was unable to provide diet hx  Nutrition-Focused physical exam completed. Findings are no fat depletion, mild muscle depletion, and no edema.   He presented with an inability to swallow per MD note. He has not undergone a SLP yet.  Will monitor for diet advancement.  Labs and Medications reviewed: Na 134 KCL 10 mEq  Diet Order:  Diet NPO time specified  Skin:  Reviewed, no issues  Last BM:  PTA  Height:   Ht Readings from Last 1 Encounters:  03/10/16 5\' 11"  (1.803 m)    Weight:   Wt Readings from Last 1 Encounters:  03/10/16 205 lb (92.987 kg)    Ideal Body Weight:  78.18 kg  BMI:  Body mass index is 28.6 kg/(m^2).  Estimated Nutritional Needs:   Kcal:  [id number]  Protein:  100-120 grams  Fluid:   >/= 2.3L  EDUCATION NEEDS:   No education needs identified at this time  Steven Church. Steven Brinkman, MS, RD LDN Inpatient Clinical Dietitian Pager [phone]

## 2016-03-11 NOTE — H&P (Addendum)
History and Physical    Steven Church DOB: 11-27-1950 DOA: 03/10/2016  PCP: [name], PA-C  Patient coming from: Home.  Chief Complaint: Altered mental status.  HPI: Steven BRENDEN Sr. is a 65 y.o. male with medical history significant of metastatic prostate cancer stage IV status post cranial radiation last month on Zytiga was brought to the ER the patient was found to be confused last evening at home by patient's family. I'm unable to reach patient's family and history was obtained from ER physician. Reviewing patient's oncologyand also was found to have increasing swelling on his left periorbital area which was new as per the oncology notes. Patient was found to be confused and had difficulty speaking bringing out words which patient has had previously. Patient is following commands and denies any chest pain or shortness of breath. Has no neck rigidity. CT of the head did not show anything acute. Chest x-ray shows features concerning for pneumonia. Patient is febrile but no leukocytosis. UA is negative for any infection. Patient was recently on Decadron which was tapered off.  ED Course: Blood cultures were obtained. Started on empiric antibiotics for pneumonia. Patient failed swallow evaluation.  Review of Systems: As per HPI otherwise 10 point review of systems negative.    Past Medical History  Diagnosis Date  . Hypertension   . Obesity   . Prostate cancer ([place]) 04/07/2015    bone mets, started oncology 03/2015  . Family history of cancer   . Former smoker   . Bone cancer ([place])   . Brain cancer [facility])     Past Surgical History  Procedure Laterality Date  . Colonoscopy      never     reports that he has quit smoking. He has never used smokeless tobacco. He reports that he does not drink alcohol or use illicit drugs.  Allergies  Allergen Reactions  . Losartan Potassium-Hctz Other (See Comments)    dizziness    Family History    Problem Relation Age of Onset  . Hypertension Mother   . Other Mother     died of old age  . Diabetes Father     died of diabetic infection complications  . Cancer Sister     lung  . Alcohol abuse Brother   . Obesity Sister   . Hypertension Brother     Prior to Admission medications   Medication Sig Start Date End Date Taking? Authorizing Provider  Aspirin-Caffeine (BC FAST PAIN RELIEF PO) Take 1 Package by mouth as needed (pain). Reported on 01/21/2016   Yes Historical Provider, MD  calcium-vitamin D 250-100 MG-UNIT per tablet Take 1 tablet by mouth 2 (two) times daily. 06/05/15  Yes [name], MD  finasteride (PROSCAR) 5 MG tablet Take 1 tablet (5 mg total) by mouth daily. 01/08/16  Yes Camelia Eng Tysinger, PA-C  fluticasone (FLONASE) 50 MCG/ACT nasal spray Place 2 sprays into both nostrils daily as needed for allergies. Reported on 01/21/2016   Yes Historical Provider, MD  losartan (COZAAR) 100 MG tablet Take 100 mg by mouth daily.   Yes Historical Provider, MD  omeprazole (PRILOSEC) 40 MG capsule TAKE 1 CAPSULE (40 MG TOTAL) BY MOUTH DAILY. 11/12/15  Yes Camelia Eng Tysinger, PA-C  potassium chloride SA (K-DUR,KLOR-CON) 20 MEQ tablet Take 1 tablet (20 mEq total) by mouth 2 (two) times daily. 03/04/16  Yes [name], MD  tamsulosin (FLOMAX) 0.4 MG CAPS capsule TAKE ONE CAPSULE BY MOUTH EVERY  DAY Patient taking differently: TAKE 0.4 MG BY MOUTH EVERY DAY 10/30/15  Yes David S Tysinger, PA-C  ZYTIGA 250 MG tablet TAKE FOUR TABLETS BY MOUTH ONE TIME DAILY ON AN EMPTY STOMACH, AT LEAST 1 HOUR BEFORE AND 2 HOURS AFTER FOOD Patient taking differently: TAKE 1,000 MG BY MOUTH ONE TIME DAILY ON AN EMPTY STOMACH, AT LEAST 1 HOUR BEFORE AND 2 HOURS AFTER FOOD 02/15/16  Yes [name], MD  dexamethasone (DECADRON) 2 MG tablet Take 1 tablet (2 mg total) by mouth 2 (two) times daily with a meal. Patient taking differently: Take 2 mg by mouth 2 (two) times daily with a meal. Decadron 4 mg twice daily  for 7 days then, 2 mg twice daily for 7 days then, 2 mg once a day for 7 days then, 1 mg once a day for seven days then, 1 mg once a day every other day for 7 days and STOP. 03/10/16   Hayden Pedro, PA-C    Physical Exam: Filed Vitals:   03/11/16 0149 03/11/16 [id number] 03/11/16 0255 03/11/16 0433  BP:   140/73 182/91  Pulse:   105 104  Temp: 100 F (37.8 C) 102.3 F (39.1 C)  99.7 F (37.6 C)  TempSrc:  Rectal  Oral  Resp:   25 24  Height:      Weight:      SpO2:   94% 93%      Constitutional: Not in distress. Filed Vitals:   03/11/16 0149 03/11/16 0212 03/11/16 0255 03/11/16 0433  BP:   140/73 182/91  Pulse:   105 104  Temp: 100 F (37.8 C) 102.3 F (39.1 C)  99.7 F (37.6 C)  TempSrc:  Rectal  Oral  Resp:   25 24  Height:      Weight:      SpO2:   94% 93%   Eyes: Left eyelid and periorbital swelling. ENMT: No discharge from the ears eyes nose and mouth there is some ulceration on the tongue. Neck: No neck rigidity. Respiratory: No rhonchi or crepitations. Cardiovascular: S1 and S2 heard. Abdomen: Soft nontender bowel sounds present. Musculoskeletal: Mild edema. Skin: No rash. Neurologic: Alert awake difficult to say his orientation since patient is having difficulty to speak. Moves all extremities. Perla positive. No facial asymmetry. Tongue is midline. Psychiatric: Patient has difficulty talking so difficult to assess. But follows commands.   Labs on Admission: I have personally reviewed following labs and imaging studies  CBC:  Recent Labs Lab 03/04/16 1125 03/10/16 2342  WBC 5.6 9.4  NEUTROABS 3.3 6.5  HGB 9.4* 10.0*  HCT 28.5* 29.4*  MCV 83.6 80.8  PLT 224 [id number]   Basic Metabolic Panel:  Recent Labs Lab 03/04/16 1125 03/10/16 2342  NA 144 138  K 2.6* 2.6*  CL  --  105  CO2 23 21*  GLUCOSE 84 75  BUN 8.7 <5*  CREATININE 1.0 0.76  CALCIUM 9.2 6.3*  MG  --  1.6*   GFR: Estimated Creatinine Clearance: 105.9 mL/min (by C-G formula  based on Cr of 0.76). Liver Function Tests:  Recent Labs Lab 03/04/16 1125 03/10/16 2342  AST 35* 31  ALT 26 18  ALKPHOS 2,137* 1935*  BILITOT 0.51 0.8  PROT 6.1* 6.9  ALBUMIN 2.7* 3.3*    Recent Labs Lab 03/11/16 0118  LIPASE 17    Recent Labs Lab 03/11/16 0118  AMMONIA 19   Coagulation Profile: No results for input(s): INR, PROTIME in the last 168 hours.  Cardiac Enzymes: No results for input(s): CKTOTAL, CKMB, CKMBINDEX, TROPONINI in the last 168 hours. BNP (last 3 results) No results for input(s): PROBNP in the last 8760 hours. HbA1C: No results for input(s): HGBA1C in the last 72 hours. CBG: No results for input(s): GLUCAP in the last 168 hours. Lipid Profile: No results for input(s): CHOL, HDL, LDLCALC, TRIG, CHOLHDL, LDLDIRECT in the last 72 hours. Thyroid Function Tests: No results for input(s): TSH, T4TOTAL, FREET4, T3FREE, THYROIDAB in the last 72 hours. Anemia Panel: No results for input(s): VITAMINB12, FOLATE, FERRITIN, TIBC, IRON, RETICCTPCT in the last 72 hours. Urine analysis:    Component Value Date/Time   COLORURINE YELLOW 03/11/2016 0146   APPEARANCEUR CLEAR 03/11/2016 0146   LABSPEC 1.014 03/11/2016 0146   PHURINE 6.0 03/11/2016 0146   GLUCOSEU NEGATIVE 03/11/2016 0146   HGBUR TRACE* 03/11/2016 0146   BILIRUBINUR NEGATIVE 03/11/2016 0146   BILIRUBINUR neg 04/24/2015 1509   KETONESUR >80* 03/11/2016 0146   PROTEINUR NEGATIVE 03/11/2016 0146   PROTEINUR neg 04/24/2015 1509   UROBILINOGEN negative 04/24/2015 1509   NITRITE NEGATIVE 03/11/2016 0146   NITRITE neg 04/24/2015 1509   LEUKOCYTESUR NEGATIVE 03/11/2016 0146   Sepsis Labs: @LABRCNTIP (procalcitonin:4,lacticidven:4) )No results found for this or any previous visit (from the past 240 hour(s)).   Radiological Exams on Admission: Dg Chest 2 View  03/11/2016  CLINICAL DATA:  Acute onset of confusion and lethargy. Current history of pancreatic cancer. Initial encounter. EXAM: CHEST  2  VIEW COMPARISON:  Chest radiograph performed 02/27/2015, and PET/CT performed 03/20/2015 FINDINGS: The lungs are well-aerated. Right peripheral and left midlung airspace opacities raise concern for pneumonia. Vascular congestion is noted. A small left pleural effusion is noted. No pneumothorax is seen. The heart is borderline normal in size. No acute osseous abnormalities are seen. IMPRESSION: Right peripheral and left midlung airspace opacities raise concern for multifocal pneumonia. Vascular congestion and small left pleural effusion noted. Followup PA and lateral chest X-ray is recommended in 3-4 weeks following trial of antibiotic therapy to ensure resolution and exclude underlying malignancy. Electronically Signed   By: Garald Balding M.D.   On: 03/11/2016 00:50   Ct Head Wo Contrast  03/11/2016  CLINICAL DATA:  Acute onset of slurred speech. Known brain metastases from pancreatic cancer. Initial encounter. EXAM: CT HEAD WITHOUT CONTRAST TECHNIQUE: Contiguous axial images were obtained from the base of the skull through the vertex without intravenous contrast. COMPARISON:  MRI of the brain performed 01/22/2016 FINDINGS: There is no evidence of acute infarction, or intra- or extra-axial hemorrhage on CT. A large partially calcified dural/epidural soft tissue mass is again noted along the left cerebral convexity, with two lobulations each measuring up to 3.6 cm. Approximately 6 mm of rightward midline shift is seen; the degree of midline shift appears somewhat improved. Additional tumor anterior to the left temporal lobe is partially characterized. Additional known tumors are not well seen on CT. Mild subcortical white matter change likely reflects small vessel ischemic microangiopathy. The brainstem and fourth ventricle are within normal limits. The basal ganglia are unremarkable in appearance. There is no evidence of fracture; visualized osseous structures are unremarkable in appearance. The orbits are  within normal limits. The paranasal sinuses and mastoid air cells are well-aerated. No significant soft tissue abnormalities are seen. IMPRESSION: 1. Large partially calcified dural/epidural soft tissue masses again noted. The largest, along the left cerebral convexity, has two lobulations, each measuring up to 3.6 cm. 6 mm of rightward midline shift noted, somewhat improved from the  prior MRI. 2. Additional known tumors are not well seen on noncontrast CT. 3. Mild small vessel ischemic microangiopathy. Electronically Signed   By: Garald Balding M.D.   On: 03/11/2016 01:54    EKG: Independently reviewed. Sinus tachycardia.  Assessment/Plan Principal Problem:   Acute encephalopathy Active Problems:   Essential hypertension   Prostate cancer metastatic to bone [facility])   Dysarthria   Chronic anemia   CAP (community acquired pneumonia)   Hypokalemia    #1. Acute encephalopathy with difficulty speaking and fever - ER physician did discuss with on-call neurologist Dr. Wallie Char who had advised to get MRI brain. I have ordered MRI brain with and without contrast. Ammonia levels are normal. Patient does have fever and chest x-ray shows features concerning for pneumonia for which patient has been placed on empiric antibiotics. Follow blood cultures procalcitonin levels lactic acid levels. Check EEG. If symptoms does not improve and MRI is negative then may need lumbar puncture. Patient failed swallow and space therapist consult has been requested. Patient was recently on Decadron. #2. Pneumonia - suspect there may be aspiration. For now I have placed patient on vancomycin and Zosyn and Zithromax. Speech therapist consult. Check procalcitonin levels. Check urine Legionella and strep antigen. Follow blood cultures. #3. Hypokalemia and hypomagnesemia - I have ordered 510 mEq of potassium IV. Magnesium has been already replaced. Recheck both labs after replacement. #4. Stage IV prostate cancer - per  oncology. #5. Chronic anemia - follow CBC. #6. Hypertension - I have placed patient on when necessary IV hydralazine for systolic blood pressure more than 210. If MRI does not show strolls and we will have more aggressive control. If patient passes swallow then may restart home antihypertensives.   Since patient failed swallow all home medications on hold. Restart once patient passes swallow. Unable to reach patient's family. Need to verify CODE STATUS again with family in the morning. For now patient is full code.   DVT prophylaxis: SCDs in anticipation of possible procedures. Code Status: Full code.  Family Communication: Unable to reach family.  Disposition Plan: Home.  Consults called: ER physician had discussed with neurologist.  Admission status: Inpatient. Telemetry. Likely stay 2-3 days.    Rise Patience MD [facility] Pager [phone].  If 7PM-7AM, please contact night-coverage [email or web address] Password [facility]  03/11/2016, 6:26 AM

## 2016-03-11 NOTE — Progress Notes (Signed)
Pharmacy Antibiotic Note  Steven Church. is a 65 y.o. male admitted on 03/10/2016 with pneumonia.  Pharmacy has been consulted for Vancomycin, zosyn dosing.  Plan: Vancomycin 1gm IV every 8 hours.  Goal trough 15-20 mcg/mL. Zosyn 3.375g IV Q8H infused over 4hrs.   Height: 5\' 11"  (180.3 cm) Weight: 205 lb (92.987 kg) IBW/kg (Calculated) : 75.3  Temp (24hrs), Avg:99.7 F (37.6 C), Min:98.1 F (36.7 C), Max:102.3 F (39.1 C)   Recent Labs Lab 03/04/16 1125 03/04/16 1125 03/10/16 2342 03/10/16 2355  WBC 5.6  --  9.4  --   CREATININE  --  1.0 0.76  --   LATICACIDVEN  --   --   --  1.64    Estimated Creatinine Clearance: 105.9 mL/min (by C-G formula based on Cr of 0.76).    Allergies  Allergen Reactions  . Losartan Potassium-Hctz Other (See Comments)    dizziness    Antimicrobials this admission: Vancomycin 03/11/2016 >> Zosyn 03/11/2016 >>  Dose adjustments this admission: -  Microbiology results: Pending  Thank you for allowing pharmacy to be a part of this patient's care.  Steven Church 03/11/2016 6:32 AM

## 2016-03-11 NOTE — Consult Note (Signed)
Neurology Consultation Reason for Consult: Aphasia Referring Physician: Nena Alexander  CC: Aphasia  History is obtained from: Family  HPI: Steven ALOI Sr. is a 65 y.o. male with a history of prostate cancer with dural metastasis on the left. His wife states that he does at baseline has some difficulty with his speech, but not to the degree that he currently is having trouble. He was admitted with altered focal pneumonia, suspected from aspiration. He was started on antibiotics and per the family, he has had significant improvement in his speech since yesterday.  He currently continues to have a severe expressive aphasia, but is able to say yes and no appropriately as well as state some words.  ROS: Unable to obtain due to altered mental status.   Past Medical History  Diagnosis Date  . Hypertension   . Obesity   . Prostate cancer ([place]) 04/07/2015    bone mets, started oncology 03/2015  . Family history of cancer   . Former smoker   . Bone cancer ([place])   . Brain cancer [facility])      Family History  Problem Relation Age of Onset  . Hypertension Mother   . Other Mother     died of old age  . Diabetes Father     died of diabetic infection complications  . Cancer Sister     lung  . Alcohol abuse Brother   . Obesity Sister   . Hypertension Brother      Social History:  reports that he has quit smoking. He has never used smokeless tobacco. He reports that he does not drink alcohol or use illicit drugs.   Exam: Current vital signs: BP 165/96 mmHg  Pulse 105  Temp(Src) 98.4 F (36.9 C) (Oral)  Resp 18  Ht 5\' 11"  (1.803 m)  Wt 92.987 kg (205 lb)  BMI 28.60 kg/m2  SpO2 96% Vital signs in last 24 hours: Temp:  [98.4 F (36.9 C)-102.3 F (39.1 C)] 98.4 F (36.9 C) (05/26 1343) Pulse Rate:  [97-117] 105 (05/26 1343) Resp:  [18-28] 18 (05/26 1343) BP: (140-183)/(73-107) 165/96 mmHg (05/26 1343) SpO2:  [93 %-96 %] 96 % (05/26 1343) Weight:  [92.987 kg (205 lb)] 92.987  kg (205 lb) (05/25 2327)   Physical Exam  Constitutional: Appears well-developed and well-nourished.  Psych: Affect appropriate to situation Eyes: No scleral injection HENT: No OP obstrucion Head: Normocephalic.  Cardiovascular: Normal rate and regular rhythm.  Respiratory: Effort normal and breath sounds normal to anterior ascultation GI: Soft.  No distension. There is no tenderness.  Skin: WDI  Neuro: Mental Status: Patient is awake, alert, He was able to give numbers when counting fingers, say yes and no, is able to follow most commands that difficulty with following some more complex commands. Cranial Nerves: II: Visual Fields are full. Pupils are equal, round, and reactive to light.   III,IV, VI: EOMI without ptosis or diploplia.  V: Facial sensation is symmetric to temperature VII: Facial movement is asymmetric with swelling versus focal incomplete dysfunction of the zygomatic branch of the seventh cranial nerve on the left, I favor mild swelling VIII: hearing is intact to voice X: Uvula elevates symmetrically XI: Shoulder shrug is symmetric. XII: tongue is midline without atrophy or fasciculations.  Motor: Tone is normal. Bulk is normal. 5/5 strength was present in all four extremities.  Sensory: Sensation is symmetric to light touch and temperature in the arms and legs. Cerebellar: FNF with some tremor bilaterally without past pointing.  I have reviewed labs in epic and the results pertinent to this consultation are: Calcium 5.7 Albumin 2.8  I have reviewed the images obtained:MRI brain - dural mets on the left.   Impression: 65 year old male with worsening aphasia in the setting of metabolic stressors, that is pneumonia. Especially with treatment of his pneumonia improving symptoms, I think that we can attribute this to metabolic worsening of underlying focal neurological symptoms at this time. I would expect him to improve as his physiological stressors  improve.  Recommendations: 1) no further recommendations at this time, I would expect gradual improvement towards his baseline. 2) please call with further questions or concerns.   Roland Rack, MD [facility] [phone]  If 7pm- 7am, please page neurology on call as listed in [place].

## 2016-03-11 NOTE — Progress Notes (Signed)
Radiation Oncology         [id number]) [phone] ________________________________  Name: Steven Akin Sr. 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 Edema, unspecified type 782.3 R60.9 VAS [place] LOWER EXTREMITY VENOUS (DVT)  2. Prostate cancer metastatic to bone (HCC) 185 C61    198.5 C79.51   3. Brain metastasis (HCC) 198.3 C79.31     Interval Since Last Radiation:  3  weeks   02/01/16-02/12/16: 30 Gy to the whole brain in 10 fractions  Narrative:  The patient returns today for an unscheduled follow-up. The patient reports that he finished treatment a couple of weeks ago, however in the last 24 hours has been feeling quite poorly. He has had intermittent episodes of nausea, dizziness, dry mouth, and lack of appetite. He has been continuing with speech therapy and feels as though this is improved. He also feels as though some of this finding motor skills are improving as well. If concern however is that in the last few days, he has experienced increase in lower extremity edema which she previously had not noticed, and just started [facility] after being seen by Dr. Geryl Councilman on 03/04/2016. He denies any shortness of breath, chest pain or Pain. He denies any diarrhea, bowel dysfunction and reports a normal bowel movement this morning. He denies any urinary disturbances. No other complaints or verbalized.                ALLERGIES:  is allergic to losartan potassium-hctz.  Meds: No current facility-administered medications for this encounter.   No current outpatient prescriptions on file.   Facility-Administered Medications Ordered in Other Encounters  Medication Dose Route Frequency Provider Last Rate Last Dose  . 0.9 %  sodium chloride infusion   Intravenous Continuous Rise Patience, MD 75 mL/hr at  03/11/16 0700    . antiseptic oral rinse (CPC / CETYLPYRIDINIUM CHLORIDE 0.05%) solution 7 mL  7 mL Mouth Rinse BID Rise Patience, MD   7 mL at 03/11/16 0931  . dexamethasone (DECADRON) injection 4 mg  4 mg Intravenous Q6H Shanker Kristeen Mans, MD      . enoxaparin (LOVENOX) injection 40 mg  40 mg Subcutaneous Q24H Shanker Kristeen Mans, MD      . fluticasone (FLONASE) 50 MCG/ACT nasal spray 2 spray  2 spray Each Nare Daily PRN Rise Patience, MD      . hydrALAZINE (APRESOLINE) injection 10 mg  10 mg Intravenous Q4H PRN Rise Patience, MD      . metoprolol (LOPRESSOR) injection 5 mg  5 mg Intravenous Q8H Shanker Kristeen Mans, MD      . piperacillin-tazobactam (ZOSYN) IVPB 3.375 g  3.375 g Intravenous Q8H Jonetta Osgood, MD   3.375 g at 03/11/16 0730  . potassium chloride 10 mEq in 100 mL IVPB  10 mEq Intravenous Once Rise Patience, MD      . vancomycin (VANCOCIN) IVPB 1000 mg/200 mL premix  1,000 mg Intravenous Q8H Jonetta Osgood, MD   1,000 mg at 03/11/16 [id number]    Physical Findings:  oral temperature is 98.1 F (36.7 C). His blood pressure is 156/91 and his pulse is 115. His respiration is 18 and oxygen saturation is 100%.   In general this is a well appearing African-American male no  acute distress. He's alert and oriented x4 and appropriate throughout the examination. Cardiovascular exam reveals a regular rate and rhythm, no clicks rubs or murmurs auscultated. Chest is to auscultation bilaterally. Lower extremities are assessed and there is 2+ pedal edema from the dorsal aspect of the patient's feet to the level of the knee bilaterally. Palpable discomfort is noted with Homans sign on the right, and negative on the left. HEENT reveals injected sclera bilaterally with exophthalmus on the left greater than right. EOMs are intact. Speech is intact. No other focal deficits are appreciated.  Lab Findings: Lab Results  Component Value Date   WBC 9.1 03/11/2016   WBC 5.6 03/04/2016     HGB 8.9* 03/11/2016   HGB 9.4* 03/04/2016   HCT 26.1* 03/11/2016   HCT 28.5* 03/04/2016   PLT 289 03/11/2016   PLT 224 03/04/2016    Lab Results  Component Value Date   NA 134* 03/11/2016   NA 144 03/04/2016   K 2.6* 03/11/2016   K 2.6* 03/04/2016   CHLORIDE 112* 03/04/2016   CO2 21* 03/11/2016   CO2 23 03/04/2016   GLUCOSE 87 03/11/2016   GLUCOSE 84 03/04/2016   BUN <5* 03/11/2016   BUN 8.7 03/04/2016   CREATININE 0.69 03/11/2016   CREATININE 1.0 03/04/2016   CREATININE 1.02 01/21/2016   BILITOT 1.0 03/11/2016   BILITOT 0.51 03/04/2016   ALKPHOS 1662* 03/11/2016   ALKPHOS 2,137* 03/04/2016   AST 27 03/11/2016   AST 35* 03/04/2016   ALT 17 03/11/2016   ALT 26 03/04/2016   PROT 6.1* 03/11/2016   PROT 6.1* 03/04/2016   ALBUMIN 2.8* 03/11/2016   ALBUMIN 2.7* 03/04/2016   CALCIUM 6.0* 03/11/2016   CALCIUM 9.2 03/04/2016   ANIONGAP 11 03/11/2016   ANIONGAP 9 03/04/2016    Radiographic Findings: Dg Chest 2 View  03/11/2016  CLINICAL DATA:  Acute onset of confusion and lethargy. Current history of pancreatic cancer. Initial encounter. EXAM: CHEST  2 VIEW COMPARISON:  Chest radiograph performed 02/27/2015, and PET/CT performed 03/20/2015 FINDINGS: The lungs are well-aerated. Right peripheral and left midlung airspace opacities raise concern for pneumonia. Vascular congestion is noted. A small left pleural effusion is noted. No pneumothorax is seen. The heart is borderline normal in size. No acute osseous abnormalities are seen. IMPRESSION: Right peripheral and left midlung airspace opacities raise concern for multifocal pneumonia. Vascular congestion and small left pleural effusion noted. Followup PA and lateral chest X-ray is recommended in 3-4 weeks following trial of antibiotic therapy to ensure resolution and exclude underlying malignancy. Electronically Signed   By: Garald Balding M.D.   On: 03/11/2016 00:50   Ct Head Wo Contrast  03/11/2016  CLINICAL DATA:  Acute onset  of slurred speech. Known brain metastases from pancreatic cancer. Initial encounter. EXAM: CT HEAD WITHOUT CONTRAST TECHNIQUE: Contiguous axial images were obtained from the base of the skull through the vertex without intravenous contrast. COMPARISON:  MRI of the brain performed 01/22/2016 FINDINGS: There is no evidence of acute infarction, or intra- or extra-axial hemorrhage on CT. A large partially calcified dural/epidural soft tissue mass is again noted along the left cerebral convexity, with two lobulations each measuring up to 3.6 cm. Approximately 6 mm of rightward midline shift is seen; the degree of midline shift appears somewhat improved. Additional tumor anterior to the left temporal lobe is partially characterized. Additional known tumors are not well seen on CT. Mild subcortical white matter change likely reflects small vessel ischemic microangiopathy. The brainstem and fourth  ventricle are within normal limits. The basal ganglia are unremarkable in appearance. There is no evidence of fracture; visualized osseous structures are unremarkable in appearance. The orbits are within normal limits. The paranasal sinuses and mastoid air cells are well-aerated. No significant soft tissue abnormalities are seen. IMPRESSION: 1. Large partially calcified dural/epidural soft tissue masses again noted. The largest, along the left cerebral convexity, has two lobulations, each measuring up to 3.6 cm. 6 mm of rightward midline shift noted, somewhat improved from the prior MRI. 2. Additional known tumors are not well seen on noncontrast CT. 3. Mild small vessel ischemic microangiopathy. Electronically Signed   By: Garald Balding M.D.   On: 03/11/2016 01:54   Mr Brain Wo Contrast  03/11/2016  CLINICAL DATA:  65 year old hypertensive male with history of prostate cancer presenting with confusion and acute onset of slurred speech. Completion of brain radiation therapy April of 2017. Subsequent encounter. EXAM: MRI HEAD  WITHOUT CONTRAST TECHNIQUE: Multiplanar, multiecho pulse sequences of the brain and surrounding structures were obtained without intravenous contrast. COMPARISON:  03/11/2016 CT.  01/22/2016 MR. FINDINGS: The present exam is motion degraded with only 5 sequences were able to be obtained prior to patient aborting examination. No acute infarct or obvious intracranial hemorrhage. Osseous metastatic disease. Prominent left hemisphere dural extension with mass effect upon left hemisphere has improved when compared to the 01/22/2016 examination. Maximal thickness of left hemispheric dural extension 1.9 cm versus prior 2.7 cm. There remains mass-effect upon the left lateral ventricle with midline shift to the 5.6 mm midline shift to the right versus prior 11.7 mm of midline shift the right. IMPRESSION: Limited exam as noted above without evidence of acute infarct. When compared to the 01/22/2016 examination, there has been interval improvement although incomplete resolution of significant left hemispheric dural extension of metastatic disease with continued mass effect upon the left lateral ventricle and midline shift to the right (currently 5.6 mm midline shift to the right versus prior 11.7 mm midline shift to the right). Electronically Signed   By: Genia Del M.D.   On: 03/11/2016 11:40    Impression/Plan:  1. Bilateral lower extremity edema. Please screen the patient and he did not have any evidence of deep venous thrombosis. I also discussed this with Dr. Brayton Layman the timing of his treatment seems to correlate with symptoms. Dr. [name] recommends continuation of Zytiga, with supportive measures and we have given the patient suggestions on purchasing over-the-counter compression garment. We will continue to follow this closely and we'll plan to see him back for follow-up of his cancer treatment in about 2 weeks. 2. Dizziness, increasing exophthalmos and intermittent nausea with a recent history of being treated  for metastatic prostate cancer to the brain. Given the history of his brain metastases, I spoke with Dr. Tammi Klippel we discussed the rationale for Ativan back steroids. Although this could increase his lower extremity edema, if his posttreatment changes in the brain or causing edema, this could explain some of his symptoms, and would be benefited by using dexamethasone. Prescription given to him for a taper starting at 2 mg twice a day for 1 week followed by 2 mg twice a weight for one week followed by 20 mg twice a day for a week followed by 1 mg every other day and then stop. He states agreement and understanding and is encouraged to call us if he has any progressive symptoms.      Carola Rhine, PAC

## 2016-03-11 NOTE — Procedures (Signed)
History: 65 year old male with a history of dural metastasis.  Sedation: None  Technique: This is a 21 channel routine scalp EEG performed at the bedside with bipolar and monopolar montages arranged in accordance to the international 10/20 system of electrode placement. One channel was dedicated to EKG recording.    Background: The background consists of intermixed alpha and beta activities. There is a well defined posterior dominant rhythm of 9 Hz that attenuates with eye opening. Sleep is not recorded. He has a very prominent Mu rhythm on the right that clearly aborts with left arm movements.  Photic stimulation: Physiologic driving is not performed  EEG Abnormalities: None  Clinical Interpretation: This normal EEG is recorded in the waking and drowsy state. There was no seizure or seizure predisposition recorded on this study. Please note that a normal EEG does not preclude the possibility of epilepsy.   Roland Rack, MD [facility] [phone]  If 7pm- 7am, please page neurology on call as listed in [place].

## 2016-03-11 NOTE — Progress Notes (Signed)
EEG completed, results pending. 

## 2016-03-11 NOTE — ED Provider Notes (Addendum)
CSN: [id number]     Arrival date & time 03/10/16  2251 History  By signing my name below, I, Forrestine Him, attest that this documentation has been prepared under the direction and in the presence of Varney Biles, MD.  Electronically Signed: Forrestine Him, ED Scribe. 03/11/2016. 1:08 AM.    Chief Complaint  Patient presents with  . Pancreatic Cancer  . Altered Mental Status   The history is provided by the patient and the spouse. No language interpreter was used.    HPI Comments: Steven SLAVEN Sr. is a 65 y.o. male with a PMHx of prostate cancer that has metastasized to the brain, HTN who presents to the Emergency Department here for altered mental status this evening. Family reports gradually worsening, gradual onset speech and memory problems x 3 weeks. Wife reports that he has been confused and having memory problems in daily activities. She also says that his taste has been off, and he has had loss of appetite resulting in him not wanting to eat. Denies cough, fevers, chills. Pt has not currently undergoing any chemotherapy treatments.   Past Medical History  Diagnosis Date  . Hypertension   . Obesity   . Prostate cancer ([place]) 04/07/2015    bone mets, started oncology 03/2015  . Family history of cancer   . Former smoker   . Bone cancer ([place])   . Brain cancer [facility])    Past Surgical History  Procedure Laterality Date  . Colonoscopy      never   Family History  Problem Relation Age of Onset  . Hypertension Mother   . Other Mother     died of old age  . Diabetes Father     died of diabetic infection complications  . Cancer Sister     lung  . Alcohol abuse Brother   . Obesity Sister   . Hypertension Brother    Social History  Substance Use Topics  . Smoking status: Former Smoker -- 1.00 packs/day for 18 years  . Smokeless tobacco: Never Used  . Alcohol Use: No    Review of Systems  A complete 10 system review of systems was obtained and all systems are negative  except as noted in the HPI and PMH.    Allergies  Losartan potassium-hctz  Home Medications   Prior to Admission medications   Medication Sig Start Date End Date Taking? Authorizing Provider  Aspirin-Caffeine (BC FAST PAIN RELIEF PO) Take 1 Package by mouth as needed (pain). Reported on 01/21/2016    Historical Provider, MD  calcium-vitamin D 250-100 MG-UNIT per tablet Take 1 tablet by mouth 2 (two) times daily. 06/05/15   [name], MD  [facility] CALCIUM+VIT D 500-125 MG-UNIT TABS Take 1 tablet by mouth 2 (two) times daily. 12/12/15   Historical Provider, MD  dexamethasone (DECADRON) 2 MG tablet Take 2 mg by mouth daily. Decadron 4 mg twice daily for 7 days then, 2 mg twice daily for 7 days then, 2 mg once a day for 7 days then, 1 mg once a day for seven days then, 1 mg once a day every other day for 7 days and STOP.    Hayden Pedro, PA-C  dexamethasone (DECADRON) 2 MG tablet Take 1 tablet (2 mg total) by mouth 2 (two) times daily with a meal. 03/10/16   Hayden Pedro, PA-C  finasteride (PROSCAR) 5 MG tablet Take 1 tablet (5 mg total) by mouth daily. 01/08/16   Carlena Hurl,  PA-C  fluticasone (FLONASE) 50 MCG/ACT nasal spray Place 2 sprays into both nostrils daily. Reported on 01/21/2016    Historical Provider, MD  losartan-hydrochlorothiazide (HYZAAR) 100-25 MG tablet Take 1 tablet by mouth daily. 02/01/16   Historical Provider, MD  omeprazole (PRILOSEC) 40 MG capsule TAKE 1 CAPSULE (40 MG TOTAL) BY MOUTH DAILY. 11/12/15   Camelia Eng Tysinger, PA-C  potassium chloride SA (K-DUR,KLOR-CON) 20 MEQ tablet Take 1 tablet (20 mEq total) by mouth 2 (two) times daily. 03/04/16   [name], MD  tamsulosin (FLOMAX) 0.4 MG CAPS capsule TAKE ONE CAPSULE BY MOUTH EVERY DAY 10/30/15   Camelia Eng Tysinger, PA-C  ZYTIGA 250 MG tablet TAKE FOUR TABLETS BY MOUTH ONE TIME DAILY ON AN EMPTY STOMACH, AT LEAST 1 HOUR BEFORE AND 2 HOURS AFTER FOOD 02/15/16   [name], MD   BP 140/73 mmHg   Pulse 105  Temp(Src) 102.3 F (39.1 C) (Rectal)  Resp 25  Ht 5\' 11"  (1.803 m)  Wt 205 lb (92.987 kg)  BMI 28.60 kg/m2  SpO2 94%   Physical Exam  Constitutional: He appears well-developed and well-nourished.  HENT:  Head: Normocephalic.  Eyes: Conjunctivae and EOM are normal.  Neck: Normal range of motion. Neck supple.  Cardiovascular: Tachycardia present.   Pulmonary/Chest: Effort normal and breath sounds normal. No respiratory distress. He has no wheezes. He has no rales.  Abdominal: Soft. He exhibits no distension.  Musculoskeletal: Normal range of motion. He exhibits edema.  RLE extremity has unilateral calf swelling, mild erythema is noted.   Lymphadenopathy:    He has no cervical adenopathy.  Neurological: He is alert.  Speech noted to be slurred, UE strength is 4/5, LE strength is 4/5, cranial nerves 2-12 are intact. Gross sensory exam is normal and pt is following simple commands, appears to have expressive aphasia.   Skin: Skin is warm. No rash noted.  Psychiatric: He has a normal mood and affect.  Nursing note and vitals reviewed.   ED Course  Procedures (including critical care time) DIAGNOSTIC STUDIES: Oxygen Saturation is 94% on RA, adequate by my interpretation.    COORDINATION OF CARE: 1:13 AM-Discussed treatment plan which includes CT Head, CT Lungs, and [place] lungs with pt at bedside and pt agreed to plan.   Labs Review Labs Reviewed  COMPREHENSIVE METABOLIC PANEL - Abnormal; Notable for the following:    Potassium 2.6 (*)    CO2 21 (*)    BUN <5 (*)    Calcium 6.3 (*)    Albumin 3.3 (*)    Alkaline Phosphatase 1935 (*)    All other components within normal limits  CBC WITH DIFFERENTIAL/PLATELET - Abnormal; Notable for the following:    RBC 3.64 (*)    Hemoglobin 10.0 (*)    HCT 29.4 (*)    RDW 18.0 (*)    All other components within normal limits  URINALYSIS, ROUTINE W REFLEX MICROSCOPIC (NOT AT [facility]) - Abnormal; Notable for the following:    Hgb  urine dipstick TRACE (*)    Ketones, ur >80 (*)    All other components within normal limits  MAGNESIUM - Abnormal; Notable for the following:    Magnesium 1.6 (*)    All other components within normal limits  URINE MICROSCOPIC-ADD ON - Abnormal; Notable for the following:    Squamous Epithelial / LPF 0-5 (*)    All other components within normal limits  CULTURE, BLOOD (ROUTINE X 2)  CULTURE, BLOOD (ROUTINE X 2)  URINE CULTURE  LIPASE, BLOOD  AMMONIA  I-STAT CG4 LACTIC ACID, ED    Imaging Review Dg Chest 2 View  03/11/2016  CLINICAL DATA:  Acute onset of confusion and lethargy. Current history of pancreatic cancer. Initial encounter. EXAM: CHEST  2 VIEW COMPARISON:  Chest radiograph performed 02/27/2015, and PET/CT performed 03/20/2015 FINDINGS: The lungs are well-aerated. Right peripheral and left midlung airspace opacities raise concern for pneumonia. Vascular congestion is noted. A small left pleural effusion is noted. No pneumothorax is seen. The heart is borderline normal in size. No acute osseous abnormalities are seen. IMPRESSION: Right peripheral and left midlung airspace opacities raise concern for multifocal pneumonia. Vascular congestion and small left pleural effusion noted. Followup PA and lateral chest X-ray is recommended in 3-4 weeks following trial of antibiotic therapy to ensure resolution and exclude underlying malignancy. Electronically Signed   By: Garald Balding M.D.   On: 03/11/2016 00:50   Ct Head Wo Contrast  03/11/2016  CLINICAL DATA:  Acute onset of slurred speech. Known brain metastases from pancreatic cancer. Initial encounter. EXAM: CT HEAD WITHOUT CONTRAST TECHNIQUE: Contiguous axial images were obtained from the base of the skull through the vertex without intravenous contrast. COMPARISON:  MRI of the brain performed 01/22/2016 FINDINGS: There is no evidence of acute infarction, or intra- or extra-axial hemorrhage on CT. A large partially calcified dural/epidural  soft tissue mass is again noted along the left cerebral convexity, with two lobulations each measuring up to 3.6 cm. Approximately 6 mm of rightward midline shift is seen; the degree of midline shift appears somewhat improved. Additional tumor anterior to the left temporal lobe is partially characterized. Additional known tumors are not well seen on CT. Mild subcortical white matter change likely reflects small vessel ischemic microangiopathy. The brainstem and fourth ventricle are within normal limits. The basal ganglia are unremarkable in appearance. There is no evidence of fracture; visualized osseous structures are unremarkable in appearance. The orbits are within normal limits. The paranasal sinuses and mastoid air cells are well-aerated. No significant soft tissue abnormalities are seen. IMPRESSION: 1. Large partially calcified dural/epidural soft tissue masses again noted. The largest, along the left cerebral convexity, has two lobulations, each measuring up to 3.6 cm. 6 mm of rightward midline shift noted, somewhat improved from the prior MRI. 2. Additional known tumors are not well seen on noncontrast CT. 3. Mild small vessel ischemic microangiopathy. Electronically Signed   By: Garald Balding M.D.   On: 03/11/2016 01:54   I have personally reviewed and evaluated these images and lab results as part of my medical decision-making.   EKG Interpretation   Date/Time:  Friday Mar 11 2016 02:26:58 EDT Ventricular Rate:  106 PR Interval:  168 QRS Duration: 104 QT Interval:  371 QTC Calculation: 493 R Axis:   -5 Text Interpretation:  Sinus tachycardia Anteroseptal infarct, old  Nonspecific repol abnormality, diffuse leads No acute changes Confirmed by  Kathrynn Humble, MD, Thelma Comp [id number]) on 03/11/2016 3:00:32 AM      MDM   Final diagnoses:  Aphasia  Dysarthria  CAP (community acquired pneumonia)  Hypokalemia  Hypomagnesemia   I personally performed the services described in this documentation,  which was scribed in my presence. The recorded information has been reviewed and is accurate.  Pt comes in with cc of confusion,  Dysarthria. Hx of prostate CA with mets - including to the brain. Has hx of dysarthria - and seeing a speech pathologist - but since this afternoon, his speech rapidly declined. Also,  patient is more confused than usual. PT was last normal this morning per wife, and recalls having a conversation with the patient when he was at the cancer center. She also reports that pt has gotten weaker over the past week and sleeping more. No cough that she has noticed. Pt hasnt complained of headaches, neck pain, chest pain, cough, dib, diarrhea, abd pain, uti like symptoms. His po intake has gone down.  Pt is noted to be following all of my commands - and i got a faitly complete neuro exam. He is however having expressive aphasia. He also is having dysarthria.  Pt has a low grade temp - so we did a rectal temp, and he does have a fever and tachycardia. CBC is not showing leukocytosis. CXR is showing some opacities - but wife cant recall severe cough.  Pt is getting chemo infusion intermittently, nothing in the last 3 weeks - last radiation was 3 weeks ago.  DDX: Worsening brain mets Stroke Encephalopathy Meningitis PNA / Bacteremia - leading to severe sepsis (AMS).  Ct head done - and it is not significantly changed. Pt considered for LP - but after discussing the case again with the wife, she clearly reports that patient had a rapid decline, and he never complained of any headache or neck pain to her. I see no nuchal rigidity. I think since the symptoms got worse acutely - infectious etiology of the brain deemed less likely right now - even with me thinking CAP is likely not the cause for the AMS.  Spoke with Neurology, Dr. Nicole Kindred as per Hospitalist request - and he recommends no need for transfer to [facility]. He agrees that MRI will be helpful, and in his judgement - based on my  history, not get LP now.  We will add acyclovir. Admit to tele. Pt's family informed of the plan and are in agreement. They are made aware, that pt might need spinal tap if he is not improving.      Varney Biles, MD 03/11/16 [id number]  Varney Biles, MD 03/12/16 [id number]

## 2016-03-11 NOTE — Progress Notes (Signed)
Pharmacy Antibiotic Note  Steven Church. is a 65 y.o. male admitted on 03/10/2016 with pneumonia, encephalopathy.  Pharmacy has been consulted for Azithromycin, Ceftriaxone, and acyclovir dosing.  Plan: Azithromycin 500mg  iv q24hr Ceftriaxone 1gm iv q24hr Acyclovir 930mg  iv q8hr  Height: 5\' 11"  (180.3 cm) Weight: 205 lb (92.987 kg) IBW/kg (Calculated) : 75.3  Temp (24hrs), Avg:99.7 F (37.6 C), Min:98.1 F (36.7 C), Max:102.3 F (39.1 C)   Recent Labs Lab 03/04/16 1125 03/04/16 1125 03/10/16 2342 03/10/16 2355  WBC 5.6  --  9.4  --   CREATININE  --  1.0 0.76  --   LATICACIDVEN  --   --   --  1.64    Estimated Creatinine Clearance: 105.9 mL/min (by C-G formula based on Cr of 0.76).    Allergies  Allergen Reactions  . Losartan Potassium-Hctz Other (See Comments)    dizziness    Antimicrobials this admission: Azithromycin 03/11/2016 >> Ceftriaxone 03/11/2016 >> Acyclovir 03/11/2016 >>   Dose adjustments this admission: -  Microbiology results: Pending  Thank you for allowing pharmacy to be a part of this patient's care.  [name] 03/11/2016 5:52 AM

## 2016-03-11 NOTE — Progress Notes (Signed)
OT Cancellation Note  Patient Details Name: Steven GAMMAGE Sr. MRN: [id number] DOB: 12/06/1950   Cancelled Treatment:    Reason Eval/Treat Not Completed: Medical issues which prohibited therapy.  Noted, pt is with a critical K+ (2.6). Will check back after potassium is initiated.  Isyss Espinal 03/11/2016, 7:44 AM  Lesle Chris, OTR/L [phone] 03/11/2016

## 2016-03-12 ENCOUNTER — Inpatient Hospital Stay (HOSPITAL_COMMUNITY): Payer: Managed Care, Other (non HMO)

## 2016-03-12 ENCOUNTER — Encounter (HOSPITAL_COMMUNITY): Payer: Self-pay | Admitting: *Deleted

## 2016-03-12 DIAGNOSIS — Z7189 Other specified counseling: Secondary | ICD-10-CM

## 2016-03-12 DIAGNOSIS — Z515 Encounter for palliative care: Secondary | ICD-10-CM

## 2016-03-12 LAB — BASIC METABOLIC PANEL
ANION GAP: 11 (ref 5–15)
BUN: 15 mg/dL (ref 6–20)
CALCIUM: 6.4 mg/dL — AB (ref 8.9–10.3)
CO2: 18 mmol/L — AB (ref 22–32)
CREATININE: 2.21 mg/dL — AB (ref 0.61–1.24)
Chloride: 108 mmol/L (ref 101–111)
GFR calc Af Amer: 34 mL/min — ABNORMAL LOW (ref >60)
GFR calc non Af Amer: 29 mL/min — ABNORMAL LOW (ref >60)
GLUCOSE: 115 mg/dL — AB (ref 65–99)
Potassium: 3.6 mmol/L (ref 3.5–5.1)
Sodium: 137 mmol/L (ref 135–145)

## 2016-03-12 LAB — COMPREHENSIVE METABOLIC PANEL
ALBUMIN: 2.9 g/dL — AB (ref 3.5–5.0)
ALK PHOS: 901 U/L — AB (ref 38–126)
ALT: 17 U/L (ref 17–63)
AST: 28 U/L (ref 15–41)
Anion gap: 11 (ref 5–15)
BILIRUBIN TOTAL: 1.3 mg/dL — AB (ref 0.3–1.2)
BUN: 10 mg/dL (ref 6–20)
CALCIUM: 5.9 mg/dL — AB (ref 8.9–10.3)
CO2: 20 mmol/L — ABNORMAL LOW (ref 22–32)
CREATININE: 1.73 mg/dL — AB (ref 0.61–1.24)
Chloride: 105 mmol/L (ref 101–111)
GFR calc Af Amer: 46 mL/min — ABNORMAL LOW (ref >60)
GFR, EST NON AFRICAN AMERICAN: 39 mL/min — AB (ref >60)
GLUCOSE: 122 mg/dL — AB (ref 65–99)
Potassium: 3.5 mmol/L (ref 3.5–5.1)
Sodium: 136 mmol/L (ref 135–145)
TOTAL PROTEIN: 6.6 g/dL (ref 6.5–8.1)

## 2016-03-12 LAB — VANCOMYCIN, TROUGH: VANCOMYCIN TR: 36 ug/mL — AB (ref 10.0–20.0)

## 2016-03-12 LAB — CBC
HCT: 29.7 % — ABNORMAL LOW (ref 39.0–52.0)
HEMOGLOBIN: 10.1 g/dL — AB (ref 13.0–17.0)
MCH: 27.5 pg (ref 26.0–34.0)
MCHC: 34 g/dL (ref 30.0–36.0)
MCV: 80.9 fL (ref 78.0–100.0)
Platelets: 331 10*3/uL (ref 150–400)
RBC: 3.67 MIL/uL — ABNORMAL LOW (ref 4.22–5.81)
RDW: 17.9 % — AB (ref 11.5–15.5)
WBC: 11 10*3/uL — ABNORMAL HIGH (ref 4.0–10.5)

## 2016-03-12 LAB — VITAMIN D 25 HYDROXY (VIT D DEFICIENCY, FRACTURES): VIT D 25 HYDROXY: 11.8 ng/mL — AB (ref 30.0–100.0)

## 2016-03-12 LAB — URINE CULTURE: Culture: NO GROWTH

## 2016-03-12 LAB — PARATHYROID HORMONE, INTACT (NO CA): PTH: 105 pg/mL — AB (ref 15–65)

## 2016-03-12 LAB — MAGNESIUM: Magnesium: 1.8 mg/dL (ref 1.7–2.4)

## 2016-03-12 MED ORDER — CALCIUM GLUCONATE 10 % IV SOLN
2.0000 g | Freq: Once | INTRAVENOUS | Status: AC
  Administered 2016-03-12: 2 g via INTRAVENOUS
  Filled 2016-03-12: qty 20

## 2016-03-12 MED ORDER — SODIUM CHLORIDE 0.9 % IV SOLN
1.0000 g | Freq: Once | INTRAVENOUS | Status: AC
  Administered 2016-03-12: 1 g via INTRAVENOUS
  Filled 2016-03-12: qty 10

## 2016-03-12 MED ORDER — MAGNESIUM SULFATE 2 GM/50ML IV SOLN
2.0000 g | Freq: Once | INTRAVENOUS | Status: AC
  Administered 2016-03-12: 2 g via INTRAVENOUS
  Filled 2016-03-12: qty 50

## 2016-03-12 MED ORDER — CALCIUM CITRATE-VITAMIN D 500-400 MG-UNIT PO CHEW
2.0000 | CHEWABLE_TABLET | Freq: Two times a day (BID) | ORAL | Status: DC
  Administered 2016-03-12 (×2): 2 via ORAL
  Filled 2016-03-12 (×4): qty 2

## 2016-03-12 NOTE — Progress Notes (Signed)
Pharmacy Antibiotic Note  Steven Church. is a 65 y.o. male with metastatic prostate cancer admitted on 03/10/2016 with acute encephalopathy and aspiration pneumonia.  Pharmacy has been consulted for Vancomycin and Zosyn dosing.  Today, 03/12/2016 Day #2 antibiotics (Vancomycin 1g q8h and Zosyn) - Afebrile - WBC slightly increased on decadron - SCr acutely increased - Blood culture growing Strep species  Plan:  DC vancomycin, check trough 8 hrs after this morning's dose  Continue Zosyn 3.375g IV Q8H infused over 4hrs.   Continue to monitor renal function  Follow up final culture and sensitivity data  Height: 5\' 11"  (180.3 cm) Weight: 205 lb (92.987 kg) IBW/kg (Calculated) : 75.3  Temp (24hrs), Avg:98.1 F (36.7 C), Min:97.5 F (36.4 C), Max:98.4 F (36.9 C)   Recent Labs Lab 03/10/16 2342 03/10/16 2355 03/11/16 0635 03/11/16 0643 03/11/16 1502 03/12/16 0522  WBC 9.4  --   --  9.1  --  11.0*  CREATININE 0.76  --   --  0.69 0.71 1.73*  LATICACIDVEN  --  1.64 0.7  --   --   --     Estimated Creatinine Clearance: 49 mL/min (by C-G formula based on Cr of 1.73).    Allergies  Allergen Reactions  . Losartan Potassium-Hctz Other (See Comments)    dizziness    Antimicrobials this admission: 5/26 Rocephin/Zithro x 1 5/26 Acyclovir x 1 5/26 Zosyn >> 5/26 Vancomycin >>  Dose adjustments this admission: 5/27 vanc trough @ 13:00 = ___ before 5th dose (vanc therapy stopped but MD wants to document level)  Microbiology results: 5/26 BCx: GPC in chains, BCID, Strep species.    5/26 UCx: sent  5/26 urine strep/legionella: neg/IP 5/26 Sputum: sent   Thank you for allowing pharmacy to be a part of this patient's care.  Peggyann Juba, PharmD, BCPS Pager: [phone] 03/12/2016 9:04 AM

## 2016-03-12 NOTE — Evaluation (Signed)
Physical Therapy One Time Evaluation Patient Details Name: Steven ARAVE Sr. MRN: [id number] DOB: 1951-05-19 Today's Date: 03/12/2016   History of Present Illness  65 y.o. 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 All education has been completed and the patient has no further questions.  Pt mobilizing very well and not requiring assistive device.  Pt reports he was staying with his sister prior to arrival and plans to d/c back home with her.  Per chart review, pt was seen earlier this month at [facility] for SLP services; recommend he continue with this.  See below for any follow-up Physical Therapy or equipment needs. PT is signing off. Thank you for this referral.     Follow Up Recommendations No PT follow up    Equipment Recommendations  None recommended by PT    Recommendations for Other Services       Precautions / Restrictions Precautions Precautions: Fall      Mobility  Bed Mobility Overal bed mobility: Needs Assistance Bed Mobility: Supine to Sit     Supine to sit: Supervision     General bed mobility comments: supervision only for lines  Transfers Overall transfer level: Needs assistance Equipment used: None Transfers: Sit to/from Stand Sit to Stand: Supervision            Ambulation/Gait Ambulation/Gait assistance: Supervision Ambulation Distance (Feet): 800 Feet Assistive device: None Gait Pattern/deviations: Step-through pattern;Antalgic   Gait velocity interpretation: at or above normal speed for age/gender General Gait Details: pt initially with L groin pain he believes from being in bed, pain and gait pattern improved with distance, pt initially pushed IV pole for 100 feet  however did not require UE support  Stairs            Wheelchair Mobility    Modified Rankin (Stroke Patients Only)       Balance                                             Pertinent Vitals/Pain Pain Assessment: No/denies pain    Home Living Family/patient expects to be discharged to:: Private residence Living Arrangements: Alone Available Help at Discharge: Family Type of Home: [place]: One level Home Equipment: None Additional Comments: plans to d/c home with his sister    Prior Function Level of Independence: Independent               Hand Dominance        Extremity/Trunk Assessment               Lower Extremity Assessment: Overall WFL for tasks assessed      Cervical / Trunk Assessment: Normal  Communication   Communication: Expressive difficulties (aphasia seems improved since admission, pt able to make sentences however difficulty word finding at times and dysarthia present)  Cognition Arousal/Alertness: Awake/alert Behavior During Therapy: WFL for tasks assessed/performed Overall Cognitive Status: Within Functional Limits for tasks assessed                      General Comments      Exercises  Assessment/Plan    PT Assessment Patent does not need any further PT services  PT Diagnosis Difficulty walking   PT Problem List    PT Treatment Interventions     PT Goals (Current goals can be found in the Care Plan section) Acute Rehab PT Goals PT Goal Formulation: All assessment and education complete, DC therapy    Frequency     Barriers to discharge        Co-evaluation               End of Session Equipment Utilized During Treatment: Gait belt Activity Tolerance: Patient tolerated treatment well Patient left: in chair;with call bell/phone within reach;with chair alarm set Nurse Communication: Mobility status         Time: [id number] PT Time Calculation  (min) (ACUTE ONLY): 18 min   Charges:   PT Evaluation $PT Eval Low Complexity: 1 Procedure     PT G Codes:        Dorinda Stehr,KATHrine E 03/12/2016, 11:39 AM Carmelia Bake, PT, DPT 03/12/2016 Pager: [phone]

## 2016-03-12 NOTE — Progress Notes (Signed)
CRITICAL VALUE ALERT  Critical value received:  Calcium 5.9  Date of notification:  03/12/16  Time of notification:  0639  Critical value read back:Yes.    Nurse who received alert:  Linden Dolin, RN  MD notified (1st page):  Jonette Eva   Time of first page:  [phone]  MD notified (2nd page):  Time of second page:  Responding MD:  M. Lynch   Time MD responded:  [id number] orders added

## 2016-03-12 NOTE — Progress Notes (Signed)
PROGRESS NOTE        PATIENT DETAILS Name: Steven ROTHMAN Sr. Age: 65 y.o. Sex: male Date of Birth: 1951/09/26 Admit Date: 03/10/2016 Admitting Physician Rise Patience, MD HBZ:JIRCVELF, DAVID Audelia Acton, PA-C Outpatient Specialists:Dr [name], Dr Tammi Klippel  Brief Narrative:  Patient is a 65 y.o. 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 No belly pain.  Assessment/Plan:  Acute encephalopathy: Multifactorial- metabolic encephalopathy from PNA, and dural/brain metastasis. NOT confused this am-following all my commands-main issue seems to be expressive aphasia. MRI brain shows improved metastasis but still some mass effect, Decadron per neurology. Neurology is following. EEG stable, clinically improved.Marland Kitchen  Aphasia: mostly expressive-able to say a few words-at times appears dysarthric as well. Nonfocal exam, question baseline, neuro following. Currently on Decadron.  HCAP/Aspiration PNA: Blood cultures positive for strep, discontinue vancomycin for now continue Zosyn, clinically better, speech eval pending.  Hypocalcemia: PTH appropriately high, Vit D low, pending Calcitriol, placed on PO Vit D, replace Calcium IV and monitor Calcium.   Anemia: likely related to malignancy/chronic disease. Follow closely  YBO:[id number] scheduled IV Lopressor and use prn IV hydralazine. Hold all oral anti-hypertensive's till seen by Speech therapy   Elevated Alk Phosphatase: chronic issue-likely due to bone mets-abdomen is soft and non tender today  Advanced prostate cancer with diffuse bony mets/bulky lymphadenopathy/dural mets: followed by Dr [name] and Dr Tammi Klippel. Recently completed whole brain radiation. On  Zytiga. Case d/w Dr [name] over the phone, advises waiting for MRI Brain and starting decadron if indicated.   ARF - Renal US stable, check Vanco levels, hold nephrotoxins, hydrate and monitor. Vancomycin stopped.    DVT Prophylaxis: Lovenox  Code Status: Full code   Family Communication: Sister, step daughter at bedside  Disposition Plan: Remain inpatient-suspect requires 2-3 days more of inpatient hospitalization  Antimicrobial agents: IV Vancomycin 5/26>> 5/27 IV Zosyn 5/26>>  Procedures: None  CONSULTS:  None  Time spent: 25 minutes-Greater than 50% of this time was spent in counseling, explanation of diagnosis, planning of further management, and coordination of care.  MEDICATIONS: Anti-infectives    Start     Dose/Rate Route Frequency Ordered Stop   03/12/16 0600  azithromycin (ZITHROMAX) 500 mg in dextrose 5 % 250 mL IVPB  Status:  Discontinued     500 mg 250 mL/hr over 60 Minutes Intravenous Every 24 hours 03/11/16 0625 03/11/16 0643   03/11/16 2200  cefTRIAXone (ROCEPHIN) 1 g in dextrose 5 % 50 mL IVPB  Status:  Discontinued     1 g 100 mL/hr over 30 Minutes Intravenous Every 24 hours 03/11/16 0626 03/11/16 0643   03/11/16 0645  vancomycin (VANCOCIN) IVPB 1000 mg/200 mL premix  Status:  Discontinued     1,000 mg 200 mL/hr over 60 Minutes Intravenous Every 8 hours 03/11/16 0643 03/12/16 0901   03/11/16 0645  piperacillin-tazobactam (ZOSYN) IVPB 3.375 g     3.375 g 12.5 mL/hr over 240 Minutes Intravenous Every 8 hours 03/11/16 0643     03/11/16 0430  acyclovir (ZOVIRAX) 930 mg in dextrose 5 % 150 mL IVPB  Status:  Discontinued     930 mg 168.6 mL/hr over 60 Minutes Intravenous  Every 8 hours 03/11/16 0335 03/11/16 0614   03/11/16 0130  cefTRIAXone (ROCEPHIN) 1 g in dextrose 5 % 50 mL IVPB     1 g 100 mL/hr over 30 Minutes Intravenous  Once 03/11/16 0123 03/11/16 0258   03/11/16 0130  azithromycin (ZITHROMAX) 500 mg in dextrose 5 % 250 mL IVPB  Status:   Discontinued     500 mg 250 mL/hr over 60 Minutes Intravenous  Once 03/11/16 0123 03/11/16 0755      Scheduled Meds: . antiseptic oral rinse  7 mL Mouth Rinse BID  . calcium citrate-vitamin D  2 tablet Oral BID  . calcium gluconate  2 g Intravenous Once  . dexamethasone  4 mg Intravenous Q6H  . enoxaparin (LOVENOX) injection  40 mg Subcutaneous Q24H  . metoprolol  5 mg Intravenous Q8H  . piperacillin-tazobactam (ZOSYN)  IV  3.375 g Intravenous Q8H  . potassium chloride  10 mEq Intravenous Once   Continuous Infusions:   PRN Meds:.fluticasone, hydrALAZINE   PHYSICAL EXAM: Vital signs: Filed Vitals:   03/11/16 0433 03/11/16 1343 03/11/16 2035 03/12/16 0512  BP: 182/91 165/96 151/82 158/89  Pulse: 104 105 80 84  Temp: 99.7 F (37.6 C) 98.4 F (36.9 C) 97.5 F (36.4 C) 98.3 F (36.8 C)  TempSrc: Oral Oral Axillary Oral  Resp: '24 18 20 22  ' Height:      Weight:      SpO2: 93% 96% 98% 96%   Filed Weights   03/10/16 2327  Weight: 92.987 kg (205 lb)   Body mass index is 28.6 kg/(m^2).   Gen Exam: Awake and alert-follows all commands- has mostly expressive aphasia   Neck: Supple, No JVD.   Chest: B/L Clear.   CVS: S1 S2 Regular, no murmurs.  Abdomen: soft, BS +, non tender, non distended.  Extremities: no edema, lower extremities warm to touch. Neurologic: Non Focal.   Skin: No Rash or lesions   Wounds: N/A.    LABORATORY DATA: CBC:  Recent Labs Lab 03/10/16 2342 03/11/16 0643 03/12/16 0522  WBC 9.4 9.1 11.0*  NEUTROABS 6.5 6.2  --   HGB 10.0* 8.9* 10.1*  HCT 29.4* 26.1* 29.7*  MCV 80.8 82.1 80.9  PLT 350 289 233    Basic Metabolic Panel:  Recent Labs Lab 03/10/16 2342 03/11/16 0643 03/11/16 1502 03/12/16 0522  NA 138 134* 136 136  K 2.6* 2.6* 2.9* 3.5  CL 105 102 104 105  CO2 21* 21* 24 20*  GLUCOSE 75 87 117* 122*  BUN <5* <5* <5* 10  CREATININE 0.76 0.69 0.71 1.73*  CALCIUM 6.3* 6.0* 5.7* 5.9*  MG 1.6* 2.0  --  1.8    GFR: Estimated  Creatinine Clearance: 49 mL/min (by C-G formula based on Cr of 1.73).  Liver Function Tests:  Recent Labs Lab 03/10/16 2342 03/11/16 0643 03/12/16 0522  AST '31 27 28  ' ALT '18 17 17  ' ALKPHOS 1935* 1662* 901*  BILITOT 0.8 1.0 1.3*  PROT 6.9 6.1* 6.6  ALBUMIN 3.3* 2.8* 2.9*    Recent Labs Lab 03/11/16 0118  LIPASE 17    Recent Labs Lab 03/11/16 0118  AMMONIA 19    Coagulation Profile: No results for input(s): INR, PROTIME in the last 168 hours.  Cardiac Enzymes: No results for input(s): CKTOTAL, CKMB, CKMBINDEX, TROPONINI in the last 168 hours.  BNP (last 3 results) No results for input(s): PROBNP in the last 8760 hours.  HbA1C: No results for input(s): HGBA1C in the last 72 hours.  CBG: No results for input(s): GLUCAP in the last 168 hours.  Lipid Profile: No results for input(s): CHOL, HDL, LDLCALC, TRIG, CHOLHDL, LDLDIRECT in the last 72 hours.  Thyroid Function Tests: No results for input(s): TSH, T4TOTAL, FREET4, T3FREE, THYROIDAB in the last 72 hours.  Anemia Panel: No results for input(s): VITAMINB12, FOLATE, FERRITIN, TIBC, IRON, RETICCTPCT in the last 72 hours.  Urine analysis:    Component Value Date/Time   COLORURINE YELLOW 03/11/2016 0146   APPEARANCEUR CLEAR 03/11/2016 0146   LABSPEC 1.014 03/11/2016 0146   PHURINE 6.0 03/11/2016 0146   GLUCOSEU NEGATIVE 03/11/2016 0146   HGBUR TRACE* 03/11/2016 0146   BILIRUBINUR NEGATIVE 03/11/2016 0146   BILIRUBINUR neg 04/24/2015 1509   KETONESUR >80* 03/11/2016 0146   PROTEINUR NEGATIVE 03/11/2016 0146   PROTEINUR neg 04/24/2015 1509   UROBILINOGEN negative 04/24/2015 1509   NITRITE NEGATIVE 03/11/2016 0146   NITRITE neg 04/24/2015 1509   LEUKOCYTESUR NEGATIVE 03/11/2016 0146    Sepsis Labs: Lactic Acid, Venous    Component Value Date/Time   LATICACIDVEN 0.7 03/11/2016 0635    MICROBIOLOGY: Recent Results (from the past 240 hour(s))  Culture, blood (Routine x 2)     Status: None  (Preliminary result)   Collection Time: 03/10/16 11:42 PM  Result Value Ref Range Status   Specimen Description BLOOD BLOOD LEFT FOREARM  Final   Special Requests BOTTLES DRAWN AEROBIC AND ANAEROBIC 5 CC  Final   Culture  Setup Time   Final    GRAM POSITIVE COCCI IN CHAINS ANAEROBIC BOTTLE ONLY CRITICAL RESULT CALLED TO, READ BACK BY AND VERIFIED WITH: T. Pickering Pharm.D. 20:05 03/11/16 ([email or web address])    Culture   Final    GRAM POSITIVE COCCI IDENTIFICATION TO FOLLOW Performed at [facility]    Report Status PENDING  Incomplete  Blood Culture ID Panel (Reflexed)     Status: Abnormal   Collection Time: 03/10/16 11:42 PM  Result Value Ref Range Status   Enterococcus species NOT DETECTED NOT DETECTED Final   Vancomycin resistance NOT DETECTED NOT DETECTED Final   Listeria monocytogenes NOT DETECTED NOT DETECTED Final   Staphylococcus species NOT DETECTED NOT DETECTED Final   Staphylococcus aureus NOT DETECTED NOT DETECTED Final   Methicillin resistance NOT DETECTED NOT DETECTED Final   Streptococcus species DETECTED (A) NOT DETECTED Final    Comment: CRITICAL RESULT CALLED TO, READ BACK BY AND VERIFIED WITH: T. Pickering Pharm.D. 20:05 03/11/16 ([email or web address])    Streptococcus agalactiae NOT DETECTED NOT DETECTED Final   Streptococcus pneumoniae NOT DETECTED NOT DETECTED Final   Streptococcus pyogenes NOT DETECTED NOT DETECTED Final   Acinetobacter baumannii NOT DETECTED NOT DETECTED Final   Enterobacteriaceae species NOT DETECTED NOT DETECTED Final   Enterobacter cloacae complex NOT DETECTED NOT DETECTED Final   Escherichia coli NOT DETECTED NOT DETECTED Final   Klebsiella oxytoca NOT DETECTED NOT DETECTED Final   Klebsiella pneumoniae NOT DETECTED NOT DETECTED Final   Proteus species NOT DETECTED NOT DETECTED Final   Serratia marcescens NOT DETECTED NOT DETECTED Final   Carbapenem resistance NOT DETECTED NOT DETECTED Final   Haemophilus influenzae NOT DETECTED NOT DETECTED Final    Neisseria meningitidis NOT DETECTED NOT DETECTED Final   Pseudomonas aeruginosa NOT DETECTED NOT DETECTED Final   Candida albicans NOT DETECTED NOT DETECTED Final   Candida glabrata NOT DETECTED NOT DETECTED Final   Candida krusei NOT DETECTED NOT DETECTED Final   Candida parapsilosis NOT DETECTED NOT DETECTED Final   Candida tropicalis NOT DETECTED NOT  DETECTED Final  Urine culture     Status: None   Collection Time: 03/11/16  1:46 AM  Result Value Ref Range Status   Specimen Description URINE, CLEAN CATCH  Final   Special Requests NONE  Final   Culture NO GROWTH Performed at [facility]   Final   Report Status 03/12/2016 FINAL  Final    RADIOLOGY STUDIES/RESULTS: Dg Chest 2 View  03/11/2016  CLINICAL DATA:  Acute onset of confusion and lethargy. Current history of pancreatic cancer. Initial encounter. EXAM: CHEST  2 VIEW COMPARISON:  Chest radiograph performed 02/27/2015, and PET/CT performed 03/20/2015 FINDINGS: The lungs are well-aerated. Right peripheral and left midlung airspace opacities raise concern for pneumonia. Vascular congestion is noted. A small left pleural effusion is noted. No pneumothorax is seen. The heart is borderline normal in size. No acute osseous abnormalities are seen. IMPRESSION: Right peripheral and left midlung airspace opacities raise concern for multifocal pneumonia. Vascular congestion and small left pleural effusion noted. Followup PA and lateral chest X-ray is recommended in 3-4 weeks following trial of antibiotic therapy to ensure resolution and exclude underlying malignancy. Electronically Signed   By: Garald Balding M.D.   On: 03/11/2016 00:50   Ct Head Wo Contrast  03/11/2016  CLINICAL DATA:  Acute onset of slurred speech. Known brain metastases from pancreatic cancer. Initial encounter. EXAM: CT HEAD WITHOUT CONTRAST TECHNIQUE: Contiguous axial images were obtained from the base of the skull through the vertex without intravenous contrast.  COMPARISON:  MRI of the brain performed 01/22/2016 FINDINGS: There is no evidence of acute infarction, or intra- or extra-axial hemorrhage on CT. A large partially calcified dural/epidural soft tissue mass is again noted along the left cerebral convexity, with two lobulations each measuring up to 3.6 cm. Approximately 6 mm of rightward midline shift is seen; the degree of midline shift appears somewhat improved. Additional tumor anterior to the left temporal lobe is partially characterized. Additional known tumors are not well seen on CT. Mild subcortical white matter change likely reflects small vessel ischemic microangiopathy. The brainstem and fourth ventricle are within normal limits. The basal ganglia are unremarkable in appearance. There is no evidence of fracture; visualized osseous structures are unremarkable in appearance. The orbits are within normal limits. The paranasal sinuses and mastoid air cells are well-aerated. No significant soft tissue abnormalities are seen. IMPRESSION: 1. Large partially calcified dural/epidural soft tissue masses again noted. The largest, along the left cerebral convexity, has two lobulations, each measuring up to 3.6 cm. 6 mm of rightward midline shift noted, somewhat improved from the prior MRI. 2. Additional known tumors are not well seen on noncontrast CT. 3. Mild small vessel ischemic microangiopathy. Electronically Signed   By: Garald Balding M.D.   On: 03/11/2016 01:54   Mr Brain Wo Contrast  03/11/2016  CLINICAL DATA:  65 year old hypertensive male with history of prostate cancer presenting with confusion and acute onset of slurred speech. Completion of brain radiation therapy April of 2017. Subsequent encounter. EXAM: MRI HEAD WITHOUT CONTRAST TECHNIQUE: Multiplanar, multiecho pulse sequences of the brain and surrounding structures were obtained without intravenous contrast. COMPARISON:  03/11/2016 CT.  01/22/2016 MR. FINDINGS: The present exam is motion degraded  with only 5 sequences were able to be obtained prior to patient aborting examination. No acute infarct or obvious intracranial hemorrhage. Osseous metastatic disease. Prominent left hemisphere dural extension with mass effect upon left hemisphere has improved when compared to the 01/22/2016 examination. Maximal thickness of left hemispheric dural extension 1.9  cm versus prior 2.7 cm. There remains mass-effect upon the left lateral ventricle with midline shift to the 5.6 mm midline shift to the right versus prior 11.7 mm of midline shift the right. IMPRESSION: Limited exam as noted above without evidence of acute infarct. When compared to the 01/22/2016 examination, there has been interval improvement although incomplete resolution of significant left hemispheric dural extension of metastatic disease with continued mass effect upon the left lateral ventricle and midline shift to the right (currently 5.6 mm midline shift to the right versus prior 11.7 mm midline shift to the right). Electronically Signed   By: Genia Del M.D.   On: 03/11/2016 11:40   US Renal  03/12/2016  CLINICAL DATA:  65 year old male with history of acute renal failure. History of prostate cancer. Hypertension. EXAM: RENAL / URINARY TRACT ULTRASOUND COMPLETE COMPARISON:  No priors. FINDINGS: Right Kidney: Length: 12.8 cm. Echogenicity within normal limits. No mass or hydronephrosis visualized. Left Kidney: Length: 12.8 cm. Echogenicity within normal limits. Small amount of perinephric fluid adjacent to the lower pole of the left kidney. No mass or hydronephrosis visualized. Bladder: Appears normal for degree of bladder distention. IMPRESSION: 1. Generally normal sonographic appearance of the kidneys and urinary bladder. There is a small amount of perinephric fluid adjacent to the lower pole of the left kidney, which is nonspecific. Electronically Signed   By: Vinnie Langton M.D.   On: 03/12/2016 09:21     LOS: 1 day   Thurnell Lose, MD  [facility] Pager:[phone]  If 7PM-7AM, please contact night-coverage [email or web address] Password [facility] 03/12/2016, 9:33 AM

## 2016-03-12 NOTE — Progress Notes (Addendum)
Bladder scan revealed 22-45mL of urine in bladder. [place] tech stated bladder looked full during renal ultrasound. Patient states he was able to pee after ultrasound before RN came into perform bladder scan. 500 mL of urine emptied from condom cath. Patient states he has to be alone in room in order to urinate (difficult for him to urinate with others in room).  Steven Church. Brigitte Pulse, RN

## 2016-03-12 NOTE — Progress Notes (Signed)
Pharmacy Antibiotic Note  See my previous note from today for full details.  In brief, checking vancomycin level given new AKI.  Vancomycin trough = 36 mcg/ml after four doses of 1g IV q8h SCr continues to rise up to 2.21  Plan:  Vancomycin already dc'd  Zosyn continues for aspiration pneumonia  Peggyann Juba, PharmD, BCPS Pager: [phone] 03/12/2016 2:13 PM

## 2016-03-12 NOTE — Progress Notes (Signed)
CRITICAL VALUE ALERT  Critical value received:  Calcium - 6.4 and Vancomycin - 36  Date of notification:  03/12/2016  Time of notification:  2:03 PM  Critical value read back:Yes.    Nurse who received alert:  B.Brigitte Pulse, RN  MD notified (1st page):  Dr. Candiss Norse  Time of first page:  2:03 PM  MD notified (2nd page):  Time of second page:  Responding MD:    Time MD responded:

## 2016-03-12 NOTE — Evaluation (Signed)
Occupational Therapy Evaluation Patient Details Name: Steven SHUMARD Sr. MRN: [id number] DOB: 1951/05/31 Today's Date: 03/12/2016    History of Present Illness 65 y.o. 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  Noted pt was supervision with PT earlier today, but during OT evaluation, pt lost balance several times when walking to and getting up from comfort height commode.  Recommend continued OT with supervision level goals.      Follow Up Recommendations  Supervision/Assistance - 24 hour;Home health OT (if another [facility] service is involved)    Equipment Recommendations  3 in 1 bedside comode (family to look into tub DME)  May also benefit from [facility] (OT). Will further assess on next visit.   Recommendations for Other Services       Precautions / Restrictions Precautions Precautions: Fall Restrictions Weight Bearing Restrictions: No      Mobility Bed Mobility Overal bed mobility: Needs Assistance Bed Mobility: Supine to Sit     Supine to sit: Supervision     General bed mobility comments: oob  Transfers Overall transfer level: Needs assistance Equipment used: None Transfers: Sit to/from Stand Sit to Stand: Min guard;Min assist         General transfer comment: min guard for safety from recliner; min A from commode due to posterior LOB    Balance Overall balance assessment: Needs assistance         Standing balance support: During functional activity;No upper extremity supported   Standing balance comment: LOB during sit to stand and ambulation.                              ADL Overall ADL's : Needs assistance/impaired             Lower Body Bathing: Minimal assistance;Sit to/from stand       Lower Body Dressing: Minimal assistance;Sit  to/from stand   Toilet Transfer: Minimal assistance;Ambulation;Comfort height toilet   Toileting- Clothing Manipulation and Hygiene: Minimal assistance;Sit to/from stand         General ADL Comments: family present (but not sister that pt will stay with.  Pt had 2 LOB walking to bathroom and needed steadying assistance when sitting on comfort height commode.  Talked to family about tub bench for safety:  one sister may have a seat.  Pt needs min A for balance for adls--for balance.  LOB when standing up from commode.  Will try RW on next visit to see if this would help him.     Vision     Perception     Praxis      Pertinent Vitals/Pain Pain Assessment: No/denies pain     Hand Dominance Right   Extremity/Trunk Assessment Upper Extremity Assessment Upper Extremity Assessment: Overall WFL for tasks assessed (slight tremor; did not affect self feeding)      Cervical / Trunk Assessment Cervical / Trunk Assessment: Normal   Communication Communication Communication: Expressive difficulties   Cognition Arousal/Alertness: Awake/alert Behavior During Therapy: WFL for tasks assessed/performed Overall Cognitive Status: Within Functional Limits for tasks assessed (mostly:  cues for safety)                     General Comments       Exercises  Shoulder Instructions      Home Living Family/patient expects to be discharged to:: Private residence   Available Help at Discharge: Family Type of Home: House             Bathroom Shower/Tub: Tub/shower unit Shower/tub characteristics: Architectural technologist: Standard     Home Equipment: None   Additional Comments: plans to return to sister's      Prior Functioning/Environment Level of Independence: Independent             OT Diagnosis: Generalized weakness   OT Problem List: Decreased strength;Decreased activity tolerance;Impaired balance (sitting and/or standing);Decreased knowledge of use of  DME or AE;Pain   OT Treatment/Interventions: Self-care/ADL training;DME and/or AE instruction;Therapeutic activities;Patient/family education;Balance training    OT Goals(Current goals can be found in the care plan section) Acute Rehab OT Goals Patient Stated Goal: none stated OT Goal Formulation: With patient/family Time For Goal Achievement: 03/19/16 Potential to Achieve Goals: Good ADL Goals Pt Will Transfer to Toilet: with supervision;ambulating;bedside commode (rw) Pt Will Perform Toileting - Clothing Manipulation and hygiene: with supervision;sit to/from stand Pt Will Perform Tub/Shower Transfer: Tub transfer;ambulating;tub bench;with min guard assist Additional ADL Goal #1: pt will complete LB adls with supervision, sit to stand  OT Frequency: Min 2X/week   Barriers to D/C:            Co-evaluation              End of Session    Activity Tolerance: Patient tolerated treatment well Patient left: in chair;with call bell/phone within reach;with chair alarm set;with family/visitor present   Time: 1441-1500 OT Time Calculation (min): 19 min Charges:  OT General Charges $OT Visit: 1 Procedure OT Evaluation $OT Eval Low Complexity: 1 Procedure G-Codes:    Steven Church 2016/04/06, 3:07 PM  Lesle Chris, OTR/L [phone] 04/06/2016

## 2016-03-12 NOTE — Evaluation (Signed)
Clinical/Bedside Swallow Evaluation Patient Details  Name: Steven Church Sr. MRN: [id number] Date of Birth: 07-21-1951  Today's Date: 03/12/2016 Time: SLP Start Time (ACUTE ONLY): 1301 SLP Stop Time (ACUTE ONLY): 1323 SLP Time Calculation (min) (ACUTE ONLY): 22 min  Past Medical History:  Past Medical History  Diagnosis Date  . Hypertension   . Obesity   . Prostate cancer ([place]) 04/07/2015    bone mets, started oncology 03/2015  . Family history of cancer   . Former smoker   . Bone cancer ([place])   . Brain cancer [facility])    Past Surgical History:  Past Surgical History  Procedure Laterality Date  . Colonoscopy      never   HPI:  65 y.o. male with medical history significant of metastatic prostate cancer stage IV status post cranial radiation last month on Zytiga was brought to the ER the patient was found to be confused last evening at home by patient's family. I'm unable to reach patient's family and history was obtained from ER physician. Reviewing patient's oncologyand also was found to have increasing swelling on his left periorbital area which was new as per the oncology notes. Patient was found to be confused and had difficulty speaking bringing out words which patient has had previously. Patient is following commands and denies any chest pain or shortness of breath. Has no neck rigidity. CT of the head did not show anything acute. Chest x-ray shows features concerning for pneumonia. Patient is febrile but no leukocytosis. UA is negative for any infection. Patient was recently on Decadron which was tapered off; Pt NPO when SLP arrived to complete BSE; no previous dysphagia noted.   Assessment / Plan / Recommendation Clinical Impression   Pt did not exhibit any overt s/s of aspiration during BSE; normal oropharyngeal swallow noted with timely swallow, OME WFL (with exception of impairment with taste, likely d/t recent chemo/radiation tx); recommended Dysphagia 3/thin liquids (per pt  request d/t difficulties with dentures, does not prefer straws); pt is currently receiving OP ST for aphasia/apraxia with [facility]; ST will f/u while in house for diet tolerance/safety; aphasia tx to resume at next level of care when pt is D/C per pt preference/familiarity with current SLP.    Aspiration Risk  No limitations    Diet Recommendation   Dysphagia 3/thin liquids (soft foods per pt request)  Medication Administration: Whole meds with liquid    Other  Recommendations Oral Care Recommendations: Oral care BID   Follow up Recommendations  Outpatient SLP;Other (comment) (currently seeing OP SLP for aphasia)    Frequency and Duration min 2x/week  1 week       Prognosis Prognosis for Safe Diet Advancement: Good      Swallow Study   General Date of Onset: 03/10/16 HPI: 65 y.o. male with medical history significant of metastatic prostate cancer stage IV status post cranial radiation last month on Zytiga was brought to the ER the patient was found to be confused last evening at home by patient's family. I'm unable to reach patient's family and history was obtained from ER physician. Reviewing patient's oncologyand also was found to have increasing swelling on his left periorbital area which was new as per the oncology notes. Patient was found to be confused and had difficulty speaking bringing out words which patient has had previously. Patient is following commands and denies any chest pain or shortness of breath. Has no neck rigidity. CT of the head did not show anything acute. Chest  x-ray shows features concerning for pneumonia. Patient is febrile but no leukocytosis. UA is negative for any infection. Patient was recently on Decadron which was tapered off. Type of Study: Bedside Swallow Evaluation Previous Swallow Assessment: NSSS 03/10/16 did not pass Diet Prior to this Study: NPO Temperature Spikes Noted: No Respiratory Status: Room air History of Recent Intubation:  No Behavior/Cognition: Alert;Cooperative;Agitated Oral Cavity Assessment: Within Functional Limits Oral Care Completed by SLP: Yes Oral Cavity - Dentition: Dentures, top;Dentures, bottom Vision: Functional for self-feeding Self-Feeding Abilities: Able to feed self Patient Positioning: Upright in chair Baseline Vocal Quality: Normal Volitional Cough: Strong Volitional Swallow: Able to elicit    Oral/Motor/Sensory Function Overall Oral Motor/Sensory Function: Mild impairment Facial ROM: Within Functional Limits Facial Symmetry: Within Functional Limits Facial Strength: Within Functional Limits Lingual Symmetry: Within Functional Limits Lingual Strength: Within Functional Limits Lingual Sensation: Reduced Velum: Within Functional Limits Mandible: Within Functional Limits   Ice Chips Ice chips: Within functional limits Presentation: Spoon   Thin Liquid Thin Liquid: Within functional limits Presentation: Cup Other Comments:  (Pt does not use straw)    Nectar Thick Nectar Thick Liquid: Not tested   Honey Thick Honey Thick Liquid: Not tested   Puree Puree: Within functional limits Presentation: Spoon   Solid      Solid: Within functional limits Presentation: Self Fed        ADAMS,PAT, M.S., CCC-SLP 03/12/2016,2:01 PM

## 2016-03-12 NOTE — Consult Note (Signed)
Consultation Note Date: 03/12/2016   Patient Name: Steven Church  DOB: [birth year]  MRN: [id number]  Age / Sex: 65 y.o., male  PCP: Carlena Hurl, PA-C Referring Physician: Thurnell Lose, MD  Reason for Consultation: Establishing goals of care  HPI/Patient Profile: 65 y.o. male  with past medical history of Castration resistant metastatic prostate cancer with osseous and dural metastasis admitted on 03/10/2016 with confusion and aphasia thought secondary to infection versus metastatic disease. Palliative was consulted for goals of care in light of his metastatic prostate cancer and decreasing functional status.  Clinical Assessment and Goals of Care: I met today with [name], his 2 sisters, and his adult son. He reports the most important things to him are feeling well, living as long as he can, his family, and his faith. He is a former Ambulance person.  He reports that his doctors have been doing a good job explaining things to him. He participates in conversation, but does still appear to have some difficulty saying exactly what he is thinking. He understands that he has metastatic disease that cannot be cured. His goal moving forward is to continue to pursue therapies that wide time her quality to his life. He feels that Dr. [name] has done a good job explaining everything to him and he values his opinion on treatment course moving forward.  We also discussed that, when the time comes where he is not receiving benefit from continued disease modifying therapy, this does not mean that there is not care left to give. What it means, however, is that we need to focus care on him feeling as well as possible each day. By maintaining his functional status through aggressive symptom management, he will add the most time in quality to his life when further disease modifying therapy is  unlikely to be beneficial.  Patient and his family were appreciative of our conversation and information. They understandably needs time to discuss amongst themselves in order to figure out what questions they have regarding our conversation.   SUMMARY OF RECOMMENDATIONS   - We discussed that the goal moving forward is to continue with any and all treatments that will add time or quality to his life. - We also began discussion of potentially limiting treatments that have low likelihood of allowing him to spend time with his family and in his faith.  This included that he would be unlikely to have meaningful recovery if he were to undergo resuscitation in the event of cardiac or respiratory arrest. - Discussed consideration for naming of surrogate decision maker on his behalf. As of this time, he is not married and would therefore be his adult son. - I left copies of [employer] [facility] for [place] for the family to review - He will discuss with his family and I will check him tomorrow to see what questions they have regarding our conversation today - No changes to current plan at this time   Code Status/Advance Care Planning:  Full code  Symptom Management:   Confusion/aphasia: Improved. Would continue treatment for pneumonia as well as steroids.  Palliative Prophylaxis:   Aspiration and Bowel Regimen  Psycho-social/Spiritual:   Desire for further Chaplaincy support:no  Additional Recommendations: Caregiving  Support/Resources  Prognosis:   Unable to determine exactly, however, if he were to forego further disease modifying therapy his prognosis would certainly be less than 6 months and he will qualify for hospice support if so desired at any point in the future. This was not discussed with patient today.  Discharge Planning: To Be Determined      Primary Diagnoses: Present on Admission:  . Dysarthria . Acute encephalopathy . Prostate cancer metastatic to bone ([place]) .  Chronic anemia . CAP (community acquired pneumonia) . Essential hypertension  I have reviewed the medical record, interviewed the patient and family, and examined the patient. The following aspects are pertinent.  Past Medical History  Diagnosis Date  . Hypertension   . Obesity   . Prostate cancer ([place]) 04/07/2015    bone mets, started oncology 03/2015  . Family history of cancer   . Former smoker   . Bone cancer ([place])   . Brain cancer [facility])    Social History   Social History  . Marital Status: Divorced    Spouse Name: N/A  . Number of Children: N/A  . Years of Education: N/A   Social History Main Topics  . Smoking status: Former Smoker -- 1.00 packs/day for 18 years  . Smokeless tobacco: Never Used  . Alcohol Use: No  . Drug Use: No  . Sexual Activity: Yes   Other Topics Concern  . None   Social History Narrative   Single, works Engineer, maintenance, Theme park manager, has strained relationship with sister and son.  Exercise with walking   Family History  Problem Relation Age of Onset  . Hypertension Mother   . Other Mother     died of old age  . Diabetes Father     died of diabetic infection complications  . Cancer Sister     lung  . Alcohol abuse Brother   . Obesity Sister   . Hypertension Brother    Scheduled Meds: . antiseptic oral rinse  7 mL Mouth Rinse BID  . calcium citrate-vitamin D  2 tablet Oral BID  . dexamethasone  4 mg Intravenous Q6H  . enoxaparin (LOVENOX) injection  40 mg Subcutaneous Q24H  . metoprolol  5 mg Intravenous Q8H  . piperacillin-tazobactam (ZOSYN)  IV  3.375 g Intravenous Q8H  . potassium chloride  10 mEq Intravenous Once   Continuous Infusions:  PRN Meds:.fluticasone, hydrALAZINE Medications Prior to Admission:  Prior to Admission medications   Medication Sig Start Date End Date Taking? Authorizing Provider  Aspirin-Caffeine (BC FAST PAIN RELIEF PO) Take 1 Package by mouth as needed (pain). Reported on 01/21/2016   Yes  Historical Provider, MD  calcium-vitamin D 250-100 MG-UNIT per tablet Take 1 tablet by mouth 2 (two) times daily. 06/05/15  Yes [name], MD  finasteride (PROSCAR) 5 MG tablet Take 1 tablet (5 mg total) by mouth daily. 01/08/16  Yes Camelia Eng Tysinger, PA-C  fluticasone (FLONASE) 50 MCG/ACT nasal spray Place 2 sprays into both nostrils daily as needed for allergies. Reported on 01/21/2016   Yes Historical Provider, MD  losartan (COZAAR) 100 MG tablet Take 100 mg by mouth daily.   Yes Historical Provider, MD  omeprazole (PRILOSEC) 40 MG capsule TAKE 1 CAPSULE (40 MG TOTAL) BY MOUTH DAILY.  11/12/15  Yes Camelia Eng Tysinger, PA-C  potassium chloride SA (K-DUR,KLOR-CON) 20 MEQ tablet Take 1 tablet (20 mEq total) by mouth 2 (two) times daily. 03/04/16  Yes [name], MD  tamsulosin (FLOMAX) 0.4 MG CAPS capsule TAKE ONE CAPSULE BY MOUTH EVERY DAY Patient taking differently: TAKE 0.4 MG BY MOUTH EVERY DAY 10/30/15  Yes David S Tysinger, PA-C  ZYTIGA 250 MG tablet TAKE FOUR TABLETS BY MOUTH ONE TIME DAILY ON AN EMPTY STOMACH, AT LEAST 1 HOUR BEFORE AND 2 HOURS AFTER FOOD Patient taking differently: TAKE 1,000 MG BY MOUTH ONE TIME DAILY ON AN EMPTY STOMACH, AT LEAST 1 HOUR BEFORE AND 2 HOURS AFTER FOOD 02/15/16  Yes [name], MD  dexamethasone (DECADRON) 2 MG tablet Take 1 tablet (2 mg total) by mouth 2 (two) times daily with a meal. Patient taking differently: Take 2 mg by mouth 2 (two) times daily with a meal. Decadron 4 mg twice daily for 7 days then, 2 mg twice daily for 7 days then, 2 mg once a day for 7 days then, 1 mg once a day for seven days then, 1 mg once a day every other day for 7 days and STOP. 03/10/16   Hayden Pedro, PA-C   Allergies  Allergen Reactions  . Losartan Potassium-Hctz Other (See Comments)    dizziness   Review of Systems  Constitutional: Positive for activity change and fatigue.  Neurological: Positive for speech difficulty.  Psychiatric/Behavioral: Positive for  confusion.  All other systems reviewed and are negative.   Physical Exam  General: Alert, awake, in no acute distress.  HEENT: No bruits, no goiter, no JVD Heart: Regular rate and rhythm. No murmur appreciated. Lungs: Good air movement, clear Abdomen: Soft, nontender, nondistended, positive bowel sounds.  Ext: No significant edema Skin: Warm and dry Neuro: Some expressive aphasia noted. He is able to fully participate in conversation.    Vital Signs: BP 158/86 mmHg  Pulse 91  Temp(Src) 98.2 F (36.8 C) (Oral)  Resp 20  Ht '5\' 11"'  (1.803 m)  Wt 92.987 kg (205 lb)  BMI 28.60 kg/m2  SpO2 97% Pain Assessment: No/denies pain   Pain Score: 0-No pain   SpO2: SpO2: 97 % O2 Device:SpO2: 97 % O2 Flow Rate: .   IO: Intake/output summary:  Intake/Output Summary (Last 24 hours) at 03/12/16 1800 Last data filed at 03/12/16 1455  Gross per 24 hour  Intake   2405 ml  Output   2300 ml  Net    105 ml    LBM: Last BM Date:  (unknown) Baseline Weight: Weight: 92.987 kg (205 lb) Most recent weight: Weight: 92.987 kg (205 lb)     Palliative Assessment/Data:   Flowsheet Rows        Most Recent Value   Intake Tab    Referral Department  Hospitalist   Unit at Time of Referral  Cardiac/Telemetry Unit   Palliative Care Primary Diagnosis  Cancer   Date Notified  03/11/16   Palliative Care Type  New Palliative care   Reason for referral  Clarify Goals of Care   Date of Admission  03/10/16   Date first seen by Palliative Care  03/12/16   # of days Palliative referral response time  1 Day(s)   # of days IP prior to Palliative referral  1   Clinical Assessment    Palliative Performance Scale Score  50%   Pain Max last 24 hours  3   Pain Min  Last 24 hours  0   Psychosocial & Spiritual Assessment    Palliative Care Outcomes    Patient/Family meeting held?  Yes   Who was at the meeting?  patient, two sisters, son   Palliative Care Outcomes  Provided advance care planning       Time In: 22 Time Out: 1310 Time Total: 80 Greater than 50%  of this time was spent counseling and coordinating care related to the above assessment and plan.  Signed by: Micheline Rough, MD   Please contact Palliative Medicine Team phone at [phone] for questions and concerns.  For individual provider: See Shea Evans

## 2016-03-13 LAB — COMPREHENSIVE METABOLIC PANEL
ALBUMIN: 2.7 g/dL — AB (ref 3.5–5.0)
ALK PHOS: 1561 U/L — AB (ref 38–126)
ALT: 16 U/L — ABNORMAL LOW (ref 17–63)
ANION GAP: 9 (ref 5–15)
AST: 22 U/L (ref 15–41)
BUN: 22 mg/dL — AB (ref 6–20)
CO2: 21 mmol/L — AB (ref 22–32)
Calcium: 5.8 mg/dL — CL (ref 8.9–10.3)
Chloride: 108 mmol/L (ref 101–111)
Creatinine, Ser: 2.58 mg/dL — ABNORMAL HIGH (ref 0.61–1.24)
GFR calc Af Amer: 28 mL/min — ABNORMAL LOW (ref >60)
GFR calc non Af Amer: 24 mL/min — ABNORMAL LOW (ref >60)
GLUCOSE: 141 mg/dL — AB (ref 65–99)
POTASSIUM: 3.7 mmol/L (ref 3.5–5.1)
SODIUM: 138 mmol/L (ref 135–145)
Total Bilirubin: 0.9 mg/dL (ref 0.3–1.2)
Total Protein: 6.2 g/dL — ABNORMAL LOW (ref 6.5–8.1)

## 2016-03-13 LAB — CULTURE, BLOOD (ROUTINE X 2)

## 2016-03-13 LAB — CBC
HEMATOCRIT: 28.6 % — AB (ref 39.0–52.0)
HEMOGLOBIN: 9.8 g/dL — AB (ref 13.0–17.0)
MCH: 27.5 pg (ref 26.0–34.0)
MCHC: 34.3 g/dL (ref 30.0–36.0)
MCV: 80.1 fL (ref 78.0–100.0)
Platelets: 352 10*3/uL (ref 150–400)
RBC: 3.57 MIL/uL — AB (ref 4.22–5.81)
RDW: 18.1 % — ABNORMAL HIGH (ref 11.5–15.5)
WBC: 8.8 10*3/uL (ref 4.0–10.5)

## 2016-03-13 LAB — MAGNESIUM: Magnesium: 2.3 mg/dL (ref 1.7–2.4)

## 2016-03-13 LAB — LEGIONELLA PNEUMOPHILA SEROGP 1 UR AG: L. pneumophila Serogp 1 Ur Ag: NEGATIVE

## 2016-03-13 LAB — GAMMA GT: GGT: 27 U/L (ref 7–50)

## 2016-03-13 LAB — PROCALCITONIN: Procalcitonin: 0.14 ng/mL

## 2016-03-13 MED ORDER — CEFTRIAXONE SODIUM IN DEXTROSE 40 MG/ML IV SOLN
2.0000 g | Freq: Every day | INTRAVENOUS | Status: DC
  Administered 2016-03-13 – 2016-03-16 (×4): 2 g via INTRAVENOUS
  Filled 2016-03-13 (×4): qty 50

## 2016-03-13 MED ORDER — SODIUM CHLORIDE 0.9 % IV SOLN
2.0000 g | INTRAVENOUS | Status: AC
  Administered 2016-03-13 (×2): 2 g via INTRAVENOUS
  Filled 2016-03-13 (×3): qty 20

## 2016-03-13 MED ORDER — ENSURE ENLIVE PO LIQD
237.0000 mL | Freq: Two times a day (BID) | ORAL | Status: DC
  Administered 2016-03-13 – 2016-03-16 (×6): 237 mL via ORAL

## 2016-03-13 MED ORDER — CALCITRIOL 0.5 MCG PO CAPS
0.5000 ug | ORAL_CAPSULE | Freq: Every day | ORAL | Status: DC
  Administered 2016-03-13: 0.5 ug via ORAL
  Filled 2016-03-13 (×2): qty 1

## 2016-03-13 MED ORDER — CALCIUM CARBONATE ANTACID 500 MG PO CHEW
800.0000 mg | CHEWABLE_TABLET | Freq: Three times a day (TID) | ORAL | Status: DC
  Administered 2016-03-13 (×3): 800 mg via ORAL
  Filled 2016-03-13 (×3): qty 4

## 2016-03-13 MED ORDER — DEXAMETHASONE 6 MG PO TABS
6.0000 mg | ORAL_TABLET | Freq: Every day | ORAL | Status: DC
  Administered 2016-03-13 – 2016-03-16 (×4): 6 mg via ORAL
  Filled 2016-03-13 (×5): qty 1

## 2016-03-13 MED ORDER — SODIUM CHLORIDE 0.9 % IV SOLN
INTRAVENOUS | Status: AC
  Administered 2016-03-13: 11:00:00 via INTRAVENOUS

## 2016-03-13 MED ORDER — VITAMIN D 1000 UNITS PO TABS
5000.0000 [IU] | ORAL_TABLET | Freq: Every day | ORAL | Status: DC
  Administered 2016-03-13 – 2016-03-14 (×2): 5000 [IU] via ORAL
  Filled 2016-03-13 (×2): qty 5

## 2016-03-13 NOTE — Progress Notes (Signed)
Occupational Therapy Treatment Patient Details Name: Steven KARMAN Sr. MRN: [id number] DOB: 1951/05/31 Today's Date: 03/13/2016    History of present illness 65 y.o. 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)-s/p recent whole brain radiation admitted with acute encephalopathy and HCAP   OT comments  Pt will need 24/7 A as home for safety  Follow Up Recommendations  Supervision/Assistance - 24 hour;Home health OT    Equipment Recommendations  3 in 1 bedside comode    Recommendations for Other Services      Precautions / Restrictions Precautions Precautions: Fall Restrictions Weight Bearing Restrictions: No       Mobility Bed Mobility Overal bed mobility: Needs Assistance Bed Mobility: Supine to Sit     Supine to sit: Supervision     General bed mobility comments: supervision only for lines  Transfers Overall transfer level: Needs assistance Equipment used: 1 person hand held assist Transfers: Sit to/from Stand;Stand Pivot Transfers Sit to Stand: Min assist                  ADL Overall ADL's : Needs assistance/impaired                     Lower Body Dressing: Minimal assistance;Sit to/from stand   Toilet Transfer: Minimal assistance;Ambulation;Comfort height toilet   Toileting- Clothing Manipulation and Hygiene: Minimal assistance;Sit to/from stand       Functional mobility during ADLs: Minimal assistance General ADL Comments: Pt will need A at home for safety upon DC                                 Pertinent Vitals/ Pain       Pain Assessment: No/denies pain         Frequency Min 2X/week     Progress Toward Goals  OT Goals(current goals can now be found in the care plan section)  Progress towards OT goals: Progressing toward goals     Plan         End of Session     Activity Tolerance Patient tolerated treatment well   Patient Left   in chair with chair alarm  and           Time: 1030-1045 OT Time Calculation (min): 15 min  Charges: OT General Charges $OT Visit: 1 Procedure OT Treatments $Self Care/Home Management : 8-22 mins  Divya Munshi, Edwena Felty D 03/13/2016, 11:26 AM

## 2016-03-13 NOTE — Progress Notes (Signed)
Daily Progress Note   Patient Name: Steven GINYARD Sr.       Date: 03/13/2016 DOB: [birth year]  Age: 65 y.o. MRN#: [id number] Attending Physician: Thurnell Lose, MD Primary Care Physician: [name], PA-C Admit Date: 03/10/2016  Reason for Consultation/Follow-up: Establishing goals of care  Subjective: I met today with Steven Church. He reports that his family is still reviewing the information I left them yesterday. He states that his sister and his son are going to serve as his surrogate decision makers if he cannot  Speak for himself in the future. He reports wanting to talk further with them prior to making any other decisions about his care. I introduced the concept of a most form and we talked again about developing a care planning with focus on interventions that are likely to allow him to enjoy time outside the hospital while also limiting care that is not in line with this goal.  Length of Stay: 2  Current Medications: Scheduled Meds:  . antiseptic oral rinse  7 mL Mouth Rinse BID  . calcitRIOL  0.5 mcg Oral Daily  . calcium carbonate  800 mg of elemental calcium Oral TID  . cefTRIAXone (ROCEPHIN)  IV  2 g Intravenous Daily  . cholecalciferol  5,000 Units Oral Daily  . dexamethasone  6 mg Oral Daily  . enoxaparin (LOVENOX) injection  40 mg Subcutaneous Q24H  . feeding supplement (ENSURE ENLIVE)  237 mL Oral BID BM  . metoprolol  5 mg Intravenous Q8H  . potassium chloride  10 mEq Intravenous Once    Continuous Infusions: . sodium chloride 75 mL/hr at 03/13/16 1114    PRN Meds: fluticasone, hydrALAZINE  Physical Exam         General: Alert, awake, in no acute distress.  HEENT: No bruits, no goiter, no JVD Heart: Regular rate and rhythm. No murmur  appreciated. Lungs: Good air movement, clear Abdomen: Soft, nontender, nondistended, positive bowel sounds.  Ext: No significant edema Skin: Warm and dry Neuro: Some expressive aphasia noted. He is able to fully participate in conversation.  Vital Signs: BP 148/89 mmHg  Pulse 82  Temp(Src) 98.2 F (36.8 C) (Oral)  Resp 20  Ht 5' 11" (1.803 m)  Wt 92.987 kg (205 lb)  BMI 28.60 kg/m2  SpO2 99% SpO2: SpO2: 99 %  O2 Device: O2 Device: Not Delivered O2 Flow Rate:    Intake/output summary:  Intake/Output Summary (Last 24 hours) at 03/13/16 1831 Last data filed at 03/13/16 1500  Gross per 24 hour  Intake 1392.5 ml  Output    926 ml  Net  466.5 ml   LBM: Last BM Date: 03/12/16 Baseline Weight: Weight: 92.987 kg (205 lb) Most recent weight: Weight: 92.987 kg (205 lb)       Palliative Assessment/Data:    Flowsheet Rows        Most Recent Value   Intake Tab    Referral Department  Hospitalist   Unit at Time of Referral  Cardiac/Telemetry Unit   Palliative Care Primary Diagnosis  Cancer   Date Notified  03/11/16   Palliative Care Type  New Palliative care   Reason for referral  Clarify Goals of Care   Date of Admission  03/10/16   Date first seen by Palliative Care  03/12/16   # of days Palliative referral response time  1 Day(s)   # of days IP prior to Palliative referral  1   Clinical Assessment    Palliative Performance Scale Score  50%   Pain Max last 24 hours  3   Pain Min Last 24 hours  0   Psychosocial & Spiritual Assessment    Palliative Care Outcomes    Patient/Family meeting held?  Yes   Who was at the meeting?  patient, two sisters, son   Palliative Care Outcomes  Provided advance care planning      Patient Active Problem List   Diagnosis Date Noted  . Dysarthria 03/11/2016  . Acute encephalopathy 03/11/2016  . Chronic anemia 03/11/2016  . CAP (community acquired pneumonia) 03/11/2016  . Hypokalemia   . Hypomagnesemia   . HCAP  (healthcare-associated pneumonia)   . DNR (do not resuscitate) discussion   . Aphasia   . Prostate cancer metastatic to bone ([place])   . Palliative care encounter   . Brain metastasis ([place]) 01/25/2016  . Prostate cancer ([place]) 04/07/2015  . Retroperitoneal lymphadenopathy   . Mediastinal lymphadenopathy 03/12/2015  . Lung mass 03/09/2015  . Essential hypertension 07/11/2014  . Rhinitis, allergic 07/11/2014    Palliative Care Assessment & Plan   Recommendations/Plan:  He reports his son is the appropriate person to be his surrogate decision maker. This would be the case by [facility] law, however, I discussed with Steven Church he may want to complete healthcare power of attorney paperwork in order to ensure that his secondary choice, one of his sisters, is in place in case his son is unavailable.  We talked again about advance care planning. His family requested additional copies of hard choices for loving people and I left these in the room today. I also reviewed a most form with Steven Church. He reports he would need to talk about this with his family and did not want to fill this out for complete anything else today.  Goals of Care and Additional Recommendations:  Limitations on Scope of Treatment: Full Scope Treatment  Code Status:    Code Status Orders        Start     Ordered   03/11/16 0622  Full code   Continuous     03/11/16 0625    Code Status History    Date Active Date Inactive Code Status Order ID Comments User Context   04/03/2015  9:47 AM 04/04/2015  3:18 AM Full Code [id number]  Corrie Mckusick,  DO HOV        Prognosis:   Unable to determine exactly, however, if he were to forego further disease modifying therapy his prognosis would certainly be less than 6 months and he will qualify for hospice support if so desired at any point in the future. This was not discussed with patient today.  Discharge Planning: To Be Determined   Care plan was discussed with  patient  Thank you for allowing the Palliative Medicine Team to assist in the care of this patient.   Time In: 1510 Time Out: 1530 Total Time 20 Prolonged Time Billed no      Greater than 50%  of this time was spent counseling and coordinating care related to the above assessment and plan.  Micheline Rough, MD  Please contact Palliative Medicine Team phone at [phone] for questions and concerns.

## 2016-03-13 NOTE — Progress Notes (Signed)
PROGRESS NOTE        PATIENT DETAILS Name: Steven PAPADOPOULOS Sr. Age: 65 y.o. Sex: male Date of Birth: 06-Nov-1950 Admit Date: 03/10/2016 Admitting Physician Rise Patience, MD GUR:[id number], DAVID Audelia Acton, PA-C Outpatient Specialists:Dr [name], Dr Tammi Klippel  Brief Narrative:  Patient is a 65 y.o. 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 No belly pain.  Assessment/Plan:  Acute encephalopathy: Multifactorial- metabolic encephalopathy from PNA, and dural/brain metastasis. Mentation has considerably improved. MRI brain shows improved metastasis but still some mass effect, EEG unremarkable, was seen by neurology. Was placed on IV Decadron and since then he has shown remarkable improvement, we'll switch to oral Decadron 03/13/2016. We will likely discharge on Decadron.  Aphasia: mostly expressive-able to say a few words-at times appears dysarthric as well. Nonfocal exam, as above MRI nonacute but much improved clinically after Decadron trial which is continued.  HCAP/Aspiration PNA with strep viridans bacteremia: Blood cultures positive for strep, clinically better, taper down to Rocephin on 03/13/2016, repeat surveillance cultures if negative will place PICC line for total 14 day treatment, being followed by speech currently on dysphagia 3 diet.  Hypocalcemia: PTH appropriately high, Vit D low, pending Calcitriol, placed on both oral calcitriol and cholecalciferol after discussions with the nephrologist on call Dr. Posey Pronto over the phone. Continue IV calcium supplementation and monitor.  Anemia: likely related to malignancy/chronic disease. Follow closely  SEG:[id number] scheduled IV Lopressor and use prn  IV hydralazine. Hold all oral anti-hypertensive's till seen by Speech therapy   Elevated Alk Phosphatase: chronic issue-likely due to bone mets-abdomen is soft and non tender today  Advanced prostate cancer with diffuse bony mets/bulky lymphadenopathy/dural mets: followed by Dr [name] and Dr Tammi Klippel. Recently completed whole brain radiation. On Zytiga. MRI as above, we will discussed with his oncologist on Monday again.   ARF - vancomycin toxicity, offending medications stopped, renal ultrasound stable, gently hydrate and monitor.    DVT Prophylaxis: Lovenox  Code Status: Full code   Family Communication: Sister, step daughter at bedside by previous M.D.  Disposition Plan: Remain inpatient-suspect requires 2-3 days more of inpatient hospitalization  Antimicrobial agents: IV Vancomycin 5/26>> 5/27 IV Zosyn 5/26>> 5/28 IV Rocephin 5/28  Procedures: None  CONSULTS:  None  Time spent: 25 minutes-Greater than 50% of this time was spent in counseling, explanation of diagnosis, planning of further management, and coordination of care.  MEDICATIONS: Anti-infectives    Start     Dose/Rate Route Frequency Ordered Stop   03/13/16 1100  cefTRIAXone (ROCEPHIN) 2 g in dextrose 5 % 50 mL IVPB - Premix     2 g 100 mL/hr over 30 Minutes Intravenous Daily 03/13/16 0959     03/12/16 0600  azithromycin (ZITHROMAX) 500 mg in dextrose 5 % 250 mL IVPB  Status:  Discontinued     500 mg 250 mL/hr over 60 Minutes Intravenous Every 24 hours 03/11/16 0625 03/11/16 0643   03/11/16 2200  cefTRIAXone (ROCEPHIN) 1 g in dextrose 5 % 50 mL IVPB  Status:  Discontinued     1 g 100 mL/hr over 30 Minutes Intravenous Every 24 hours 03/11/16 0626 03/11/16 0643   03/11/16 0645  vancomycin (VANCOCIN) IVPB 1000 mg/200 mL premix  Status:  Discontinued     1,000 mg 200 mL/hr over 60 Minutes Intravenous Every 8 hours 03/11/16 0643 03/12/16 0901   03/11/16 0645  piperacillin-tazobactam (ZOSYN) IVPB 3.375 g   Status:  Discontinued     3.375 g 12.5 mL/hr over 240 Minutes Intravenous Every 8 hours 03/11/16 0643 03/13/16 0959   03/11/16 0430  acyclovir (ZOVIRAX) 930 mg in dextrose 5 % 150 mL IVPB  Status:  Discontinued     930 mg 168.6 mL/hr over 60 Minutes Intravenous Every 8 hours 03/11/16 0335 03/11/16 0614   03/11/16 0130  cefTRIAXone (ROCEPHIN) 1 g in dextrose 5 % 50 mL IVPB     1 g 100 mL/hr over 30 Minutes Intravenous  Once 03/11/16 0123 03/11/16 0258   03/11/16 0130  azithromycin (ZITHROMAX) 500 mg in dextrose 5 % 250 mL IVPB  Status:  Discontinued     500 mg 250 mL/hr over 60 Minutes Intravenous  Once 03/11/16 0123 03/11/16 0755      Scheduled Meds: . antiseptic oral rinse  7 mL Mouth Rinse BID  . calcitRIOL  0.5 mcg Oral Daily  . calcium carbonate  800 mg of elemental calcium Oral TID  . calcium gluconate  2 g Intravenous Q4H  . cefTRIAXone (ROCEPHIN)  IV  2 g Intravenous Daily  . cholecalciferol  5,000 Units Oral Daily  . dexamethasone  6 mg Oral Daily  . enoxaparin (LOVENOX) injection  40 mg Subcutaneous Q24H  . feeding supplement (ENSURE ENLIVE)  237 mL Oral BID BM  . metoprolol  5 mg Intravenous Q8H  . potassium chloride  10 mEq Intravenous Once   Continuous Infusions:   PRN Meds:.fluticasone, hydrALAZINE   PHYSICAL EXAM: Vital signs: Filed Vitals:   03/12/16 0512 03/12/16 1420 03/12/16 2308 03/13/16 0450  BP: 158/89 158/86 142/92 147/90  Pulse: 84 91 90 77  Temp: 98.3 F (36.8 C) 98.2 F (36.8 C) 98.3 F (36.8 C) 98.1 F (36.7 C)  TempSrc: Oral Oral Oral Oral  Resp: '22 20 22 20  ' Height:      Weight:      SpO2: 96% 97% 96% 97%   Filed Weights   03/10/16 2327  Weight: 92.987 kg (205 lb)   Body mass index is 28.6 kg/(m^2).   Gen Exam: Awake and alert-follows all commands- has mostly expressive aphasia   Neck: Supple, No JVD.   Chest: B/L Clear.   CVS: S1 S2 Regular, no murmurs.  Abdomen: soft, BS +, non tender, non distended.  Extremities: no edema,  lower extremities warm to touch. Neurologic: Non Focal.   Skin: No Rash or lesions   Wounds: N/A.    LABORATORY DATA: CBC:  Recent Labs Lab 03/10/16 2342 03/11/16 0643 03/12/16 0522 03/13/16 0524  WBC 9.4 9.1 11.0* 8.8  NEUTROABS 6.5 6.2  --   --   HGB 10.0* 8.9* 10.1* 9.8*  HCT 29.4* 26.1* 29.7* 28.6*  MCV 80.8 82.1 80.9 80.1  PLT 350 289 331 664    Basic Metabolic Panel:  Recent Labs Lab 03/10/16 2342 03/11/16 0643 03/11/16 1502 03/12/16 0522 03/12/16 1311 03/13/16 0524  NA 138 134* 136 136 137 138  K 2.6* 2.6* 2.9* 3.5 3.6 3.7  CL 105 102 104 105 108 108  CO2 21* 21* 24 20* 18* 21*  GLUCOSE 75 87 117* 122* 115* 141*  BUN <5* <5* <5* 10 15 22*  CREATININE 0.76 0.69 0.71 1.73* 2.21* 2.58*  CALCIUM 6.3*  6.0* 5.7* 5.9* 6.4* 5.8*  MG 1.6* 2.0  --  1.8  --  2.3    GFR: Estimated Creatinine Clearance: 32.8 mL/min (by C-G formula based on Cr of 2.58).  Liver Function Tests:  Recent Labs Lab 03/10/16 2342 03/11/16 0643 03/12/16 0522 03/13/16 0524  AST '31 27 28 22  ' ALT '18 17 17 ' 16*  ALKPHOS 1935* 1662* 901* 1561*  BILITOT 0.8 1.0 1.3* 0.9  PROT 6.9 6.1* 6.6 6.2*  ALBUMIN 3.3* 2.8* 2.9* 2.7*    Recent Labs Lab 03/11/16 0118  LIPASE 17    Recent Labs Lab 03/11/16 0118  AMMONIA 19    Coagulation Profile: No results for input(s): INR, PROTIME in the last 168 hours.  Cardiac Enzymes: No results for input(s): CKTOTAL, CKMB, CKMBINDEX, TROPONINI in the last 168 hours.  BNP (last 3 results) No results for input(s): PROBNP in the last 8760 hours.  HbA1C: No results for input(s): HGBA1C in the last 72 hours.  CBG: No results for input(s): GLUCAP in the last 168 hours.  Lipid Profile: No results for input(s): CHOL, HDL, LDLCALC, TRIG, CHOLHDL, LDLDIRECT in the last 72 hours.  Thyroid Function Tests: No results for input(s): TSH, T4TOTAL, FREET4, T3FREE, THYROIDAB in the last 72 hours.  Anemia Panel: No results for input(s): VITAMINB12,  FOLATE, FERRITIN, TIBC, IRON, RETICCTPCT in the last 72 hours.  Urine analysis:    Component Value Date/Time   COLORURINE YELLOW 03/11/2016 0146   APPEARANCEUR CLEAR 03/11/2016 0146   LABSPEC 1.014 03/11/2016 0146   PHURINE 6.0 03/11/2016 0146   GLUCOSEU NEGATIVE 03/11/2016 0146   HGBUR TRACE* 03/11/2016 0146   BILIRUBINUR NEGATIVE 03/11/2016 0146   BILIRUBINUR neg 04/24/2015 1509   KETONESUR >80* 03/11/2016 0146   PROTEINUR NEGATIVE 03/11/2016 0146   PROTEINUR neg 04/24/2015 1509   UROBILINOGEN negative 04/24/2015 1509   NITRITE NEGATIVE 03/11/2016 0146   NITRITE neg 04/24/2015 1509   LEUKOCYTESUR NEGATIVE 03/11/2016 0146    Sepsis Labs: Lactic Acid, Venous    Component Value Date/Time   LATICACIDVEN 0.7 03/11/2016 0635    MICROBIOLOGY: Recent Results (from the past 240 hour(s))  Culture, blood (Routine x 2)     Status: Abnormal   Collection Time: 03/10/16 11:42 PM  Result Value Ref Range Status   Specimen Description BLOOD BLOOD LEFT FOREARM  Final   Special Requests BOTTLES DRAWN AEROBIC AND ANAEROBIC 5 CC  Final   Culture  Setup Time   Final    GRAM POSITIVE COCCI IN CHAINS ANAEROBIC BOTTLE ONLY CRITICAL RESULT CALLED TO, READ BACK BY AND VERIFIED WITH: T. Pickering Pharm.D. 20:05 03/11/16 ([email or web address])    Culture (A)  Final    VIRIDANS STREPTOCOCCUS THE SIGNIFICANCE OF ISOLATING THIS ORGANISM FROM A SINGLE SET OF BLOOD CULTURES WHEN MULTIPLE SETS ARE DRAWN IS UNCERTAIN. PLEASE NOTIFY THE MICROBIOLOGY DEPARTMENT WITHIN ONE WEEK IF SPECIATION AND SENSITIVITIES ARE REQUIRED. Performed at [facility]    Report Status 03/13/2016 FINAL  Final  Blood Culture ID Panel (Reflexed)     Status: Abnormal   Collection Time: 03/10/16 11:42 PM  Result Value Ref Range Status   Enterococcus species NOT DETECTED NOT DETECTED Final   Vancomycin resistance NOT DETECTED NOT DETECTED Final   Listeria monocytogenes NOT DETECTED NOT DETECTED Final   Staphylococcus species NOT  DETECTED NOT DETECTED Final   Staphylococcus aureus NOT DETECTED NOT DETECTED Final   Methicillin resistance NOT DETECTED NOT DETECTED Final   Streptococcus species DETECTED (A) NOT DETECTED Final  Comment: CRITICAL RESULT CALLED TO, READ BACK BY AND VERIFIED WITH: T. Pickering Pharm.D. 20:05 03/11/16 ([email or web address])    Streptococcus agalactiae NOT DETECTED NOT DETECTED Final   Streptococcus pneumoniae NOT DETECTED NOT DETECTED Final   Streptococcus pyogenes NOT DETECTED NOT DETECTED Final   Acinetobacter baumannii NOT DETECTED NOT DETECTED Final   Enterobacteriaceae species NOT DETECTED NOT DETECTED Final   Enterobacter cloacae complex NOT DETECTED NOT DETECTED Final   Escherichia coli NOT DETECTED NOT DETECTED Final   Klebsiella oxytoca NOT DETECTED NOT DETECTED Final   Klebsiella pneumoniae NOT DETECTED NOT DETECTED Final   Proteus species NOT DETECTED NOT DETECTED Final   Serratia marcescens NOT DETECTED NOT DETECTED Final   Carbapenem resistance NOT DETECTED NOT DETECTED Final   Haemophilus influenzae NOT DETECTED NOT DETECTED Final   Neisseria meningitidis NOT DETECTED NOT DETECTED Final   Pseudomonas aeruginosa NOT DETECTED NOT DETECTED Final   Candida albicans NOT DETECTED NOT DETECTED Final   Candida glabrata NOT DETECTED NOT DETECTED Final   Candida krusei NOT DETECTED NOT DETECTED Final   Candida parapsilosis NOT DETECTED NOT DETECTED Final   Candida tropicalis NOT DETECTED NOT DETECTED Final  Culture, blood (Routine x 2)     Status: None (Preliminary result)   Collection Time: 03/11/16 12:33 AM  Result Value Ref Range Status   Specimen Description BLOOD RIGHT HAND  Final   Special Requests IN PEDIATRIC BOTTLE 1CC  Final   Culture   Final    NO GROWTH 1 DAY Performed at [facility]    Report Status PENDING  Incomplete  Urine culture     Status: None   Collection Time: 03/11/16  1:46 AM  Result Value Ref Range Status   Specimen Description URINE, CLEAN CATCH   Final   Special Requests NONE  Final   Culture NO GROWTH Performed at [facility]   Final   Report Status 03/12/2016 FINAL  Final    RADIOLOGY STUDIES/RESULTS: Dg Chest 2 View  03/11/2016  CLINICAL DATA:  Acute onset of confusion and lethargy. Current history of pancreatic cancer. Initial encounter. EXAM: CHEST  2 VIEW COMPARISON:  Chest radiograph performed 02/27/2015, and PET/CT performed 03/20/2015 FINDINGS: The lungs are well-aerated. Right peripheral and left midlung airspace opacities raise concern for pneumonia. Vascular congestion is noted. A small left pleural effusion is noted. No pneumothorax is seen. The heart is borderline normal in size. No acute osseous abnormalities are seen. IMPRESSION: Right peripheral and left midlung airspace opacities raise concern for multifocal pneumonia. Vascular congestion and small left pleural effusion noted. Followup PA and lateral chest X-ray is recommended in 3-4 weeks following trial of antibiotic therapy to ensure resolution and exclude underlying malignancy. Electronically Signed   By: Garald Balding M.D.   On: 03/11/2016 00:50   Ct Head Wo Contrast  03/11/2016  CLINICAL DATA:  Acute onset of slurred speech. Known brain metastases from pancreatic cancer. Initial encounter. EXAM: CT HEAD WITHOUT CONTRAST TECHNIQUE: Contiguous axial images were obtained from the base of the skull through the vertex without intravenous contrast. COMPARISON:  MRI of the brain performed 01/22/2016 FINDINGS: There is no evidence of acute infarction, or intra- or extra-axial hemorrhage on CT. A large partially calcified dural/epidural soft tissue mass is again noted along the left cerebral convexity, with two lobulations each measuring up to 3.6 cm. Approximately 6 mm of rightward midline shift is seen; the degree of midline shift appears somewhat improved. Additional tumor anterior to the left temporal lobe is partially  characterized. Additional known tumors are not  well seen on CT. Mild subcortical white matter change likely reflects small vessel ischemic microangiopathy. The brainstem and fourth ventricle are within normal limits. The basal ganglia are unremarkable in appearance. There is no evidence of fracture; visualized osseous structures are unremarkable in appearance. The orbits are within normal limits. The paranasal sinuses and mastoid air cells are well-aerated. No significant soft tissue abnormalities are seen. IMPRESSION: 1. Large partially calcified dural/epidural soft tissue masses again noted. The largest, along the left cerebral convexity, has two lobulations, each measuring up to 3.6 cm. 6 mm of rightward midline shift noted, somewhat improved from the prior MRI. 2. Additional known tumors are not well seen on noncontrast CT. 3. Mild small vessel ischemic microangiopathy. Electronically Signed   By: Garald Balding M.D.   On: 03/11/2016 01:54   Mr Brain Wo Contrast  03/11/2016  CLINICAL DATA:  65 year old hypertensive male with history of prostate cancer presenting with confusion and acute onset of slurred speech. Completion of brain radiation therapy April of 2017. Subsequent encounter. EXAM: MRI HEAD WITHOUT CONTRAST TECHNIQUE: Multiplanar, multiecho pulse sequences of the brain and surrounding structures were obtained without intravenous contrast. COMPARISON:  03/11/2016 CT.  01/22/2016 MR. FINDINGS: The present exam is motion degraded with only 5 sequences were able to be obtained prior to patient aborting examination. No acute infarct or obvious intracranial hemorrhage. Osseous metastatic disease. Prominent left hemisphere dural extension with mass effect upon left hemisphere has improved when compared to the 01/22/2016 examination. Maximal thickness of left hemispheric dural extension 1.9 cm versus prior 2.7 cm. There remains mass-effect upon the left lateral ventricle with midline shift to the 5.6 mm midline shift to the right versus prior 11.7 mm of  midline shift the right. IMPRESSION: Limited exam as noted above without evidence of acute infarct. When compared to the 01/22/2016 examination, there has been interval improvement although incomplete resolution of significant left hemispheric dural extension of metastatic disease with continued mass effect upon the left lateral ventricle and midline shift to the right (currently 5.6 mm midline shift to the right versus prior 11.7 mm midline shift to the right). Electronically Signed   By: Genia Del M.D.   On: 03/11/2016 11:40   US Renal  03/12/2016  CLINICAL DATA:  65 year old male with history of acute renal failure. History of prostate cancer. Hypertension. EXAM: RENAL / URINARY TRACT ULTRASOUND COMPLETE COMPARISON:  No priors. FINDINGS: Right Kidney: Length: 12.8 cm. Echogenicity within normal limits. No mass or hydronephrosis visualized. Left Kidney: Length: 12.8 cm. Echogenicity within normal limits. Small amount of perinephric fluid adjacent to the lower pole of the left kidney. No mass or hydronephrosis visualized. Bladder: Appears normal for degree of bladder distention. IMPRESSION: 1. Generally normal sonographic appearance of the kidneys and urinary bladder. There is a small amount of perinephric fluid adjacent to the lower pole of the left kidney, which is nonspecific. Electronically Signed   By: Vinnie Langton M.D.   On: 03/12/2016 09:21     LOS: 2 days   Thurnell Lose, MD  [facility] Pager:[phone]  If 7PM-7AM, please contact night-coverage [email or web address] Password [email or web address] 03/13/2016, 10:03 AM

## 2016-03-14 DIAGNOSIS — Z7189 Other specified counseling: Secondary | ICD-10-CM | POA: Insufficient documentation

## 2016-03-14 LAB — CALCITRIOL (1,25 DI-OH VIT D): Vit D, 1,25-Dihydroxy: 108 pg/mL — ABNORMAL HIGH (ref 19.9–79.3)

## 2016-03-14 LAB — COMPREHENSIVE METABOLIC PANEL
ALBUMIN: 2.8 g/dL — AB (ref 3.5–5.0)
ALK PHOS: 1455 U/L — AB (ref 38–126)
ALT: 17 U/L (ref 17–63)
AST: 26 U/L (ref 15–41)
Anion gap: 9 (ref 5–15)
BILIRUBIN TOTAL: 0.4 mg/dL (ref 0.3–1.2)
BUN: 35 mg/dL — AB (ref 6–20)
CALCIUM: 6.4 mg/dL — AB (ref 8.9–10.3)
CO2: 22 mmol/L (ref 22–32)
Chloride: 111 mmol/L (ref 101–111)
Creatinine, Ser: 2.74 mg/dL — ABNORMAL HIGH (ref 0.61–1.24)
GFR calc Af Amer: 26 mL/min — ABNORMAL LOW (ref >60)
GFR, EST NON AFRICAN AMERICAN: 23 mL/min — AB (ref >60)
GLUCOSE: 112 mg/dL — AB (ref 65–99)
Potassium: 3.4 mmol/L — ABNORMAL LOW (ref 3.5–5.1)
Sodium: 142 mmol/L (ref 135–145)
TOTAL PROTEIN: 6.1 g/dL — AB (ref 6.5–8.1)

## 2016-03-14 MED ORDER — POTASSIUM CHLORIDE CRYS ER 20 MEQ PO TBCR
40.0000 meq | EXTENDED_RELEASE_TABLET | Freq: Once | ORAL | Status: AC
  Administered 2016-03-14: 40 meq via ORAL
  Filled 2016-03-14 (×2): qty 2

## 2016-03-14 MED ORDER — CALCITRIOL 0.5 MCG PO CAPS
0.5000 ug | ORAL_CAPSULE | Freq: Two times a day (BID) | ORAL | Status: DC
  Administered 2016-03-14 – 2016-03-16 (×5): 0.5 ug via ORAL
  Filled 2016-03-14 (×6): qty 1

## 2016-03-14 MED ORDER — CALCIUM CARBONATE ANTACID 500 MG PO CHEW
1000.0000 mg | CHEWABLE_TABLET | Freq: Three times a day (TID) | ORAL | Status: DC
  Administered 2016-03-14 – 2016-03-15 (×6): 1000 mg via ORAL
  Filled 2016-03-14 (×3): qty 5

## 2016-03-14 MED ORDER — SODIUM CHLORIDE 0.9 % IV SOLN
2.0000 g | Freq: Once | INTRAVENOUS | Status: AC
  Administered 2016-03-14: 2 g via INTRAVENOUS
  Filled 2016-03-14: qty 20

## 2016-03-14 MED ORDER — SODIUM CHLORIDE 0.9 % IV SOLN
2.0000 g | INTRAVENOUS | Status: AC
  Administered 2016-03-14 (×2): 2 g via INTRAVENOUS
  Filled 2016-03-14 (×2): qty 20

## 2016-03-14 NOTE — Progress Notes (Signed)
Daily Progress Note   Patient Name: Steven KLEINFELTER Sr.       Date: 03/14/2016 DOB: 1951-06-18  Age: 65 y.o. MRN#: [id number] Attending Physician: Thurnell Lose, MD Primary Care Physician: [name], PA-C Admit Date: 03/10/2016  Reason for Consultation/Follow-up: Establishing goals of care  Subjective: I met today with Steven Church and his sister Steven Church. Discussed again with them today about MOST form and developing a care planning with focus on interventions that are likely to allow him to enjoy time outside the hospital while also limiting care that is not in line with this goal.  Length of Stay: 3  Current Medications: Scheduled Meds:  . antiseptic oral rinse  7 mL Mouth Rinse BID  . calcitRIOL  0.5 mcg Oral BID  . calcium carbonate  1,000 mg of elemental calcium Oral TID  . cefTRIAXone (ROCEPHIN)  IV  2 g Intravenous Daily  . cholecalciferol  5,000 Units Oral Daily  . dexamethasone  6 mg Oral Daily  . enoxaparin (LOVENOX) injection  40 mg Subcutaneous Q24H  . feeding supplement (ENSURE ENLIVE)  237 mL Oral BID BM  . metoprolol  5 mg Intravenous Q8H    Continuous Infusions:    PRN Meds: fluticasone, hydrALAZINE  Physical Exam         General: Alert, awake, in no acute distress.  HEENT: No bruits, no goiter, no JVD Heart: Regular rate and rhythm. No murmur appreciated. Lungs: Good air movement, clear Abdomen: Soft, nontender, nondistended, positive bowel sounds.  Ext: No significant edema Skin: Warm and dry Neuro: Some expressive aphasia noted. He is able to fully participate in conversation.  Vital Signs: BP 145/94 mmHg  Pulse 65  Temp(Src) 98.8 F (37.1 C) (Oral)  Resp 18  Ht _0  (1.803 m)  Wt 94.257 kg (207 lb 12.8 oz)  BMI 28.99 kg/m2   SpO2 99% SpO2: SpO2: 99 % O2 Device: O2 Device: Not Delivered O2 Flow Rate:    Intake/output summary:   Intake/Output Summary (Last 24 hours) at 03/14/16 2322 Last data filed at 03/14/16 1824  Gross per 24 hour  Intake   1160 ml  Output   1775 ml  Net   -615 ml   LBM: Last BM Date: 03/12/16 Baseline Weight: Weight: 92.987 kg (205 lb) Most recent weight: Weight: 94.257  kg (207 lb 12.8 oz)       Palliative Assessment/Data:    Flowsheet Rows        Most Recent Value   Intake Tab    Referral Department  Hospitalist   Unit at Time of Referral  Cardiac/Telemetry Unit   Palliative Care Primary Diagnosis  Cancer   Date Notified  03/11/16   Palliative Care Type  New Palliative care   Reason for referral  Clarify Goals of Care   Date of Admission  03/10/16   Date first seen by Palliative Care  03/12/16   # of days Palliative referral response time  1 Day(s)   # of days IP prior to Palliative referral  1   Clinical Assessment    Palliative Performance Scale Score  50%   Pain Max last 24 hours  3   Pain Min Last 24 hours  0   Psychosocial & Spiritual Assessment    Palliative Care Outcomes    Patient/Family meeting held?  Yes   Who was at the meeting?  patient, two sisters, son   Palliative Care Outcomes  Provided advance care planning      Patient Active Problem List   Diagnosis Date Noted  . Advanced care planning/counseling discussion   . Dysarthria 03/11/2016  . Acute encephalopathy 03/11/2016  . Chronic anemia 03/11/2016  . CAP (community acquired pneumonia) 03/11/2016  . Hypokalemia   . Hypomagnesemia   . HCAP (healthcare-associated pneumonia)   . DNR (do not resuscitate) discussion   . Aphasia   . Prostate cancer metastatic to bone ([place])   . Palliative care encounter   . Brain metastasis ([place]) 01/25/2016  . Prostate cancer ([place]) 04/07/2015  . Retroperitoneal lymphadenopathy   . Mediastinal lymphadenopathy 03/12/2015  . Lung mass 03/09/2015  . Essential  hypertension 07/11/2014  . Rhinitis, allergic 07/11/2014    Palliative Care Assessment & Plan   Recommendations/Plan:  He reports his son is the appropriate person to be his surrogate decision maker. This would be the case by [facility] law, however, I discussed with Steven Church he may want to complete healthcare power of attorney paperwork in order to ensure that his secondary choice, one of his sisters, is in place in case his son is unavailable.  We talked again about advance care planning.  I had a long conversation with patient and his sister, Steven Church.  I reviewed a MOST form with Steven Church yesterday and discussed with his sister, Steven Church today.  We discussed that based on his stated values, I would recommend DNR, Limited additional interventions, ABX and IVF if indicated, and No feeding tube. His sister reports that this makes a lot of sense and she will continue to discuss with patient and his son.  She will call if he would like to complete MOST form prior to discharge or if family would like to meet to discuss further.  I advised her that this is something that could be completed as an OP.   Goals of Care and Additional Recommendations:  Limitations on Scope of Treatment: Full Scope Treatment  Code Status:    Code Status Orders        Start     Ordered   03/11/16 0622  Full code   Continuous     03/11/16 0625    Code Status History    Date Active Date Inactive Code Status Order ID Comments User Context   04/03/2015  9:47 AM 04/04/2015  3:18 AM Full Code [id number]  Corrie Mckusick, DO HOV        Prognosis:   Unable to determine exactly, however, if he were to forego further disease modifying therapy his prognosis would certainly be less than 6 months and he will qualify for hospice support if so desired at any point in the future. This was discussed with patient and his sister today.  Discharge Planning: likely home in 24-48 hours  Care plan was discussed with  patient  Thank you for allowing the Palliative Medicine Team to assist in the care of this patient.   Time In: 1500 Time Out: 1530 Total Time 30 Prolonged Time Billed no      Greater than 50%  of this time was spent counseling and coordinating care related to the above assessment and plan.  Micheline Rough, MD  Please contact Palliative Medicine Team phone at [phone] for questions and concerns.

## 2016-03-14 NOTE — Progress Notes (Signed)
Calcium this morning is 6.4.  L. [place] on call and notified.  Awaiting response

## 2016-03-14 NOTE — Progress Notes (Signed)
Occupational Therapy Treatment Patient Details Name: MAYSON LONIE Sr. MRN: [id number] DOB: 11/08/1950 Today's Date: 03/14/2016    History of present illness 65 y.o. 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  Follow Up Recommendations  Supervision/Assistance - 24 hour;Home health OT    Equipment Recommendations  3 in 1 bedside comode    Recommendations for Other Services      Precautions / Restrictions Precautions Precautions: Fall Restrictions Weight Bearing Restrictions: No       Mobility Bed Mobility               General bed mobility comments: pt in chair  Transfers Overall transfer level: Needs assistance   Transfers: Stand Pivot Transfers;Sit to/from Stand   Stand pivot transfers: Min guard                ADL Overall ADL's : Needs assistance/impaired     Grooming: Standing;Min guard       Lower Body Bathing: Min guard;Sit to/from stand;Cueing for safety       Lower Body Dressing: Min guard;Sit to/from stand;Cueing for safety   Toilet Transfer: Economist and Hygiene: Min guard;Sit to/from stand;Cueing for safety       Functional mobility during ADLs: Min guard General ADL Comments: pt improved this day- balance and speech.                Cognition   Behavior During Therapy: WFL for tasks assessed/performed Overall Cognitive Status: Within Functional Limits for tasks assessed (mostly:  cues for safety)                                    Pertinent Vitals/ Pain       Pain Assessment: No/denies pain         Frequency Min 2X/week     Progress Toward Goals  OT Goals(current goals can now be found in the care plan section)  Progress towards OT goals: Progressing toward goals  Acute  Rehab OT Goals OT Goal Formulation: With patient  Plan Discharge plan remains appropriate    Co-evaluation                 End of Session     Activity Tolerance Patient tolerated treatment well   Patient Left     Nurse Communication          Time: [id number] OT Time Calculation (min): 25 min  Charges: OT General Charges $OT Visit: 1 Procedure OT Treatments $Self Care/Home Management : 23-37 mins  Dorthie Santini, Thereasa Parkin 03/14/2016, 1:46 PM

## 2016-03-14 NOTE — Progress Notes (Signed)
Speech Language Pathology Treatment: Dysphagia  Patient Details Name: Steven STAUBS Sr. MRN: [id number] DOB: 05/31/1951 Today's Date: 03/14/2016 Time: 3832-9191 SLP Time Calculation (min) (ACUTE ONLY): 31 min  Assessment / Plan / Recommendation Clinical Impression  Pt seen to assure tolerance of po diet and educate re: aspiration mitigation strategies.  Pt stated he did not recall having problems swallowing upon admit and is consuming soft foods only due to not having denture adhesive.  Pt reports need to follow up with dentist re: denture modification - advised him to weight loss likely contributing to fit issues and recommend pt follow up as needed.    Pt reports he is swallowing medications well and states he has early satiety and admits to gustatory changes - ? If due to medications.    Observed pt consuming water via cup - he prefers no straws but did not indicate specific reason.  No indication of airway compromise.  Suspect swallow ability is at baseline given medical improvement.    Expressive motor planning issues and aphasia noted but pt is compensating well!  Recommend continue OP SLP to maximize functional communication.  SLP assisted pt to order dinner given his communication difficulties - he desired to order soft foods.   Egg salad is not allowed on dys3 diet, therefore SLP advanced diet to regular as pt is well aware of need to order items he can manage.     HPI HPI: 66 y.o. male with medical history significant of metastatic prostate cancer stage IV status post cranial radiation last month on Zytiga was brought to the ER the patient was found to be confused last evening at home by patient's family. I'm unable to reach patient's family and history was obtained from ER physician. Reviewing patient's oncologyand also was found to have increasing swelling on his left periorbital area which was new as per the oncology notes. Patient was found to be confused and had difficulty  speaking bringing out words which patient has had previously. Patient is following commands and denies any chest pain or shortness of breath. Has no neck rigidity. CT of the head did not show anything acute. Chest x-ray shows features concerning for pneumonia. Patient is febrile but no leukocytosis. UA is negative for any infection. Patient was recently on Decadron which was tapered off.      SLP Plan  All goals met     Recommendations  Diet recommendations: Regular;Thin liquid (to allow pt to choose items he can tolerate) Liquids provided via: Cup Medication Administration:  (as tolerated) Supervision: Patient able to self feed Compensations: Slow rate;Small sips/bites Postural Changes and/or Swallow Maneuvers: Seated upright 90 degrees;Hold breath before and during swallow (Supraglottic swallow)             Oral Care Recommendations: Oral care BID Follow up Recommendations: Outpatient SLP;Other (comment) (currently seeing OP SLP for aphasia) Plan: All goals met     [place], [place] [facility] SLP [phone]

## 2016-03-14 NOTE — Progress Notes (Signed)
PROGRESS NOTE        PATIENT DETAILS Name: Steven KERCE Sr. Age: 65 y.o. Sex: male Date of Birth: [birth year] Admit Date: 03/10/2016 Admitting Physician Rise Patience, MD NOB:[id number], DAVID Audelia Acton, PA-C Outpatient Specialists:Dr [name], Dr Tammi Klippel  Brief Narrative:  Patient is a 65 y.o. 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 No belly pain.  Assessment/Plan:  Acute encephalopathy: Multifactorial- metabolic encephalopathy from PNA, and dural/brain metastasis. Mentation has considerably improved. MRI brain shows improved metastasis but still some mass effect, EEG unremarkable, was seen by neurology. Was placed on IV Decadron and since then he has shown remarkable improvement, we'll switch to oral Decadron 03/13/2016, DW Neuro Dr Leonel Ramsay on 03/13/16. We will likely discharge on Decadron.  Expressive Aphasia: mostly expressive-able to say a few words-at times appears dysarthric as well. Nonfocal exam, as above MRI nonacute but much improved clinically after Decadron trial which is continued.  HCAP/Aspiration PNA with strep viridans bacteremia: Blood cultures positive for strep, clinically better, taper down to Rocephin on 03/13/2016, repeat surveillance cultures if negative will place PICC line for total 14 day treatment, being followed by speech currently on dysphagia 3 diet.  Hypocalcemia: PTH appropriately high, Vit D low, pending Calcitriol, placed on both oral calcitriol and cholecalciferol after discussions with the nephrologist on call Dr. Posey Pronto over the phone. Continue IV calcium supplementation and monitor.  Anemia: likely related to malignancy/chronic disease. Follow  closely  QHU:[id number] scheduled IV Lopressor and use prn IV hydralazine. Hold all oral anti-hypertensive's till seen by Speech therapy   Elevated Alk Phosphatase: chronic issue-likely due to bone mets-abdomen is soft and non tender today  Advanced prostate cancer with diffuse bony mets/bulky lymphadenopathy/dural mets: followed by Dr [name] and Dr Tammi Klippel. Recently completed whole brain radiation. On Zytiga. MRI as above, we will discussed with his oncologist on Monday again.   ARF - vancomycin toxicity, offending medications stopped, renal ultrasound stable, gently hydrate and monitor.    DVT Prophylaxis: Lovenox  Code Status: Full code   Family Communication: Sister, step daughter at bedside by previous M.D.  Disposition Plan: Remain inpatient-suspect requires 2-3 days more of inpatient hospitalization  Antimicrobial agents: IV Vancomycin 5/26>> 5/27 IV Zosyn 5/26>> 5/28 IV Rocephin 5/28  Procedures: None  CONSULTS:  None  Time spent: 25 minutes-Greater than 50% of this time was spent in counseling, explanation of diagnosis, planning of further management, and coordination of care.  MEDICATIONS: Anti-infectives    Start     Dose/Rate Route Frequency Ordered Stop   03/13/16 1100  cefTRIAXone (ROCEPHIN) 2 g in dextrose 5 % 50 mL IVPB - Premix     2 g 100 mL/hr over 30 Minutes Intravenous Daily 03/13/16 0959     03/12/16 0600  azithromycin (ZITHROMAX) 500 mg in dextrose 5 % 250 mL IVPB  Status:  Discontinued     500 mg 250 mL/hr over 60 Minutes Intravenous Every 24 hours 03/11/16 0625 03/11/16 0643   03/11/16 2200  cefTRIAXone (ROCEPHIN) 1 g in dextrose 5 % 50 mL IVPB  Status:  Discontinued     1 g 100 mL/hr over 30 Minutes Intravenous Every 24 hours 03/11/16  0100 03/11/16 0643   03/11/16 0645  vancomycin (VANCOCIN) IVPB 1000 mg/200 mL premix  Status:  Discontinued     1,000 mg 200 mL/hr over 60 Minutes Intravenous Every 8 hours 03/11/16 0643 03/12/16 0901    03/11/16 0645  piperacillin-tazobactam (ZOSYN) IVPB 3.375 g  Status:  Discontinued     3.375 g 12.5 mL/hr over 240 Minutes Intravenous Every 8 hours 03/11/16 0643 03/13/16 0959   03/11/16 0430  acyclovir (ZOVIRAX) 930 mg in dextrose 5 % 150 mL IVPB  Status:  Discontinued     930 mg 168.6 mL/hr over 60 Minutes Intravenous Every 8 hours 03/11/16 0335 03/11/16 0614   03/11/16 0130  cefTRIAXone (ROCEPHIN) 1 g in dextrose 5 % 50 mL IVPB     1 g 100 mL/hr over 30 Minutes Intravenous  Once 03/11/16 0123 03/11/16 0258   03/11/16 0130  azithromycin (ZITHROMAX) 500 mg in dextrose 5 % 250 mL IVPB  Status:  Discontinued     500 mg 250 mL/hr over 60 Minutes Intravenous  Once 03/11/16 0123 03/11/16 0755      Scheduled Meds: . antiseptic oral rinse  7 mL Mouth Rinse BID  . calcitRIOL  0.5 mcg Oral BID  . calcium carbonate  1,000 mg of elemental calcium Oral TID  . calcium gluconate  2 g Intravenous Q4H  . calcium gluconate  2 g Intravenous Once  . cefTRIAXone (ROCEPHIN)  IV  2 g Intravenous Daily  . cholecalciferol  5,000 Units Oral Daily  . dexamethasone  6 mg Oral Daily  . enoxaparin (LOVENOX) injection  40 mg Subcutaneous Q24H  . feeding supplement (ENSURE ENLIVE)  237 mL Oral BID BM  . metoprolol  5 mg Intravenous Q8H  . potassium chloride  10 mEq Intravenous Once   Continuous Infusions: . sodium chloride 75 mL/hr at 03/13/16 1114   PRN Meds:.fluticasone, hydrALAZINE   PHYSICAL EXAM: Vital signs: Filed Vitals:   03/13/16 0450 03/13/16 1459 03/13/16 2046 03/14/16 0611  BP: 147/90 148/89 155/87 135/86  Pulse: 77 82 65 75  Temp: 98.1 F (36.7 C) 98.2 F (36.8 C) 98.5 F (36.9 C) 98.5 F (36.9 C)  TempSrc: Oral Oral Oral Oral  Resp: '20 20 20 18  ' Height:      Weight:    94.257 kg (207 lb 12.8 oz)  SpO2: 97% 99% 100% 98%   Filed Weights   03/10/16 2327 03/14/16 0611  Weight: 92.987 kg (205 lb) 94.257 kg (207 lb 12.8 oz)   Body mass index is 28.99 kg/(m^2).   Gen Exam:  Awake and alert-follows all commands- much improved expressive aphasia   Neck: Supple, No JVD.   Chest: B/L Clear.   CVS: S1 S2 Regular, no murmurs.  Abdomen: soft, BS +, non tender, non distended.  Extremities: no edema, lower extremities warm to touch. Neurologic: Non Focal.   Skin: No Rash or lesions   Wounds: N/A.    LABORATORY DATA: CBC:  Recent Labs Lab 03/10/16 2342 03/11/16 0643 03/12/16 0522 03/13/16 0524  WBC 9.4 9.1 11.0* 8.8  NEUTROABS 6.5 6.2  --   --   HGB 10.0* 8.9* 10.1* 9.8*  HCT 29.4* 26.1* 29.7* 28.6*  MCV 80.8 82.1 80.9 80.1  PLT 350 289 331 712    Basic Metabolic Panel:  Recent Labs Lab 03/10/16 2342 03/11/16 0643 03/11/16 1502 03/12/16 0522 03/12/16 1311 03/13/16 0524 03/14/16 0416  NA 138 134* 136 136 137 138 142  K 2.6* 2.6* 2.9* 3.5 3.6 3.7 3.4*  CL 105 102 104 105 108 108 111  CO2 21* 21* 24 20* 18* 21* 22  GLUCOSE 75 87 117* 122* 115* 141* 112*  BUN <5* <5* <5* 10 15 22* 35*  CREATININE 0.76 0.69 0.71 1.73* 2.21* 2.58* 2.74*  CALCIUM 6.3* 6.0* 5.7* 5.9* 6.4* 5.8* 6.4*  MG 1.6* 2.0  --  1.8  --  2.3  --     GFR: Estimated Creatinine Clearance: 31.1 mL/min (by C-G formula based on Cr of 2.74).  Liver Function Tests:  Recent Labs Lab 03/10/16 2342 03/11/16 0643 03/12/16 0522 03/13/16 0524 03/14/16 0416  AST '31 27 28 22 26  ' ALT '18 17 17 ' 16* 17  ALKPHOS 1935* 1662* 901* 1561* 1455*  BILITOT 0.8 1.0 1.3* 0.9 0.4  PROT 6.9 6.1* 6.6 6.2* 6.1*  ALBUMIN 3.3* 2.8* 2.9* 2.7* 2.8*    Recent Labs Lab 03/11/16 0118  LIPASE 17    Recent Labs Lab 03/11/16 0118  AMMONIA 19    Coagulation Profile: No results for input(s): INR, PROTIME in the last 168 hours.  Cardiac Enzymes: No results for input(s): CKTOTAL, CKMB, CKMBINDEX, TROPONINI in the last 168 hours.  BNP (last 3 results) No results for input(s): PROBNP in the last 8760 hours.  HbA1C: No results for input(s): HGBA1C in the last 72 hours.  CBG: No results for  input(s): GLUCAP in the last 168 hours.  Lipid Profile: No results for input(s): CHOL, HDL, LDLCALC, TRIG, CHOLHDL, LDLDIRECT in the last 72 hours.  Thyroid Function Tests: No results for input(s): TSH, T4TOTAL, FREET4, T3FREE, THYROIDAB in the last 72 hours.  Anemia Panel: No results for input(s): VITAMINB12, FOLATE, FERRITIN, TIBC, IRON, RETICCTPCT in the last 72 hours.  Urine analysis:    Component Value Date/Time   COLORURINE YELLOW 03/11/2016 0146   APPEARANCEUR CLEAR 03/11/2016 0146   LABSPEC 1.014 03/11/2016 0146   PHURINE 6.0 03/11/2016 0146   GLUCOSEU NEGATIVE 03/11/2016 0146   HGBUR TRACE* 03/11/2016 0146   BILIRUBINUR NEGATIVE 03/11/2016 0146   BILIRUBINUR neg 04/24/2015 1509   KETONESUR >80* 03/11/2016 0146   PROTEINUR NEGATIVE 03/11/2016 0146   PROTEINUR neg 04/24/2015 1509   UROBILINOGEN negative 04/24/2015 1509   NITRITE NEGATIVE 03/11/2016 0146   NITRITE neg 04/24/2015 1509   LEUKOCYTESUR NEGATIVE 03/11/2016 0146    Sepsis Labs: Lactic Acid, Venous    Component Value Date/Time   LATICACIDVEN 0.7 03/11/2016 0635    MICROBIOLOGY: Recent Results (from the past 240 hour(s))  Culture, blood (Routine x 2)     Status: Abnormal   Collection Time: 03/10/16 11:42 PM  Result Value Ref Range Status   Specimen Description BLOOD BLOOD LEFT FOREARM  Final   Special Requests BOTTLES DRAWN AEROBIC AND ANAEROBIC 5 CC  Final   Culture  Setup Time   Final    GRAM POSITIVE COCCI IN CHAINS ANAEROBIC BOTTLE ONLY CRITICAL RESULT CALLED TO, READ BACK BY AND VERIFIED WITH: T. Pickering Pharm.D. 20:05 03/11/16 ([email or web address])    Culture (A)  Final    VIRIDANS STREPTOCOCCUS THE SIGNIFICANCE OF ISOLATING THIS ORGANISM FROM A SINGLE SET OF BLOOD CULTURES WHEN MULTIPLE SETS ARE DRAWN IS UNCERTAIN. PLEASE NOTIFY THE MICROBIOLOGY DEPARTMENT WITHIN ONE WEEK IF SPECIATION AND SENSITIVITIES ARE REQUIRED. Performed at [facility]    Report Status 03/13/2016 FINAL  Final  Blood  Culture ID Panel (Reflexed)     Status: Abnormal   Collection Time: 03/10/16 11:42 PM  Result Value Ref Range Status   Enterococcus species NOT DETECTED NOT  DETECTED Final   Vancomycin resistance NOT DETECTED NOT DETECTED Final   Listeria monocytogenes NOT DETECTED NOT DETECTED Final   Staphylococcus species NOT DETECTED NOT DETECTED Final   Staphylococcus aureus NOT DETECTED NOT DETECTED Final   Methicillin resistance NOT DETECTED NOT DETECTED Final   Streptococcus species DETECTED (A) NOT DETECTED Final    Comment: CRITICAL RESULT CALLED TO, READ BACK BY AND VERIFIED WITH: T. Pickering Pharm.D. 20:05 03/11/16 ([email or web address])    Streptococcus agalactiae NOT DETECTED NOT DETECTED Final   Streptococcus pneumoniae NOT DETECTED NOT DETECTED Final   Streptococcus pyogenes NOT DETECTED NOT DETECTED Final   Acinetobacter baumannii NOT DETECTED NOT DETECTED Final   Enterobacteriaceae species NOT DETECTED NOT DETECTED Final   Enterobacter cloacae complex NOT DETECTED NOT DETECTED Final   Escherichia coli NOT DETECTED NOT DETECTED Final   Klebsiella oxytoca NOT DETECTED NOT DETECTED Final   Klebsiella pneumoniae NOT DETECTED NOT DETECTED Final   Proteus species NOT DETECTED NOT DETECTED Final   Serratia marcescens NOT DETECTED NOT DETECTED Final   Carbapenem resistance NOT DETECTED NOT DETECTED Final   Haemophilus influenzae NOT DETECTED NOT DETECTED Final   Neisseria meningitidis NOT DETECTED NOT DETECTED Final   Pseudomonas aeruginosa NOT DETECTED NOT DETECTED Final   Candida albicans NOT DETECTED NOT DETECTED Final   Candida glabrata NOT DETECTED NOT DETECTED Final   Candida krusei NOT DETECTED NOT DETECTED Final   Candida parapsilosis NOT DETECTED NOT DETECTED Final   Candida tropicalis NOT DETECTED NOT DETECTED Final  Culture, blood (Routine x 2)     Status: None (Preliminary result)   Collection Time: 03/11/16 12:33 AM  Result Value Ref Range Status   Specimen Description BLOOD RIGHT HAND   Final   Special Requests IN PEDIATRIC BOTTLE 1CC  Final   Culture   Final    NO GROWTH 2 DAYS Performed at [facility]    Report Status PENDING  Incomplete  Urine culture     Status: None   Collection Time: 03/11/16  1:46 AM  Result Value Ref Range Status   Specimen Description URINE, CLEAN CATCH  Final   Special Requests NONE  Final   Culture NO GROWTH Performed at [facility]   Final   Report Status 03/12/2016 FINAL  Final    RADIOLOGY STUDIES/RESULTS: Dg Chest 2 View  03/11/2016  CLINICAL DATA:  Acute onset of confusion and lethargy. Current history of pancreatic cancer. Initial encounter. EXAM: CHEST  2 VIEW COMPARISON:  Chest radiograph performed 02/27/2015, and PET/CT performed 03/20/2015 FINDINGS: The lungs are well-aerated. Right peripheral and left midlung airspace opacities raise concern for pneumonia. Vascular congestion is noted. A small left pleural effusion is noted. No pneumothorax is seen. The heart is borderline normal in size. No acute osseous abnormalities are seen. IMPRESSION: Right peripheral and left midlung airspace opacities raise concern for multifocal pneumonia. Vascular congestion and small left pleural effusion noted. Followup PA and lateral chest X-ray is recommended in 3-4 weeks following trial of antibiotic therapy to ensure resolution and exclude underlying malignancy. Electronically Signed   By: Garald Balding M.D.   On: 03/11/2016 00:50   Ct Head Wo Contrast  03/11/2016  CLINICAL DATA:  Acute onset of slurred speech. Known brain metastases from pancreatic cancer. Initial encounter. EXAM: CT HEAD WITHOUT CONTRAST TECHNIQUE: Contiguous axial images were obtained from the base of the skull through the vertex without intravenous contrast. COMPARISON:  MRI of the brain performed 01/22/2016 FINDINGS: There is no evidence of acute infarction,  or intra- or extra-axial hemorrhage on CT. A large partially calcified dural/epidural soft tissue mass is  again noted along the left cerebral convexity, with two lobulations each measuring up to 3.6 cm. Approximately 6 mm of rightward midline shift is seen; the degree of midline shift appears somewhat improved. Additional tumor anterior to the left temporal lobe is partially characterized. Additional known tumors are not well seen on CT. Mild subcortical white matter change likely reflects small vessel ischemic microangiopathy. The brainstem and fourth ventricle are within normal limits. The basal ganglia are unremarkable in appearance. There is no evidence of fracture; visualized osseous structures are unremarkable in appearance. The orbits are within normal limits. The paranasal sinuses and mastoid air cells are well-aerated. No significant soft tissue abnormalities are seen. IMPRESSION: 1. Large partially calcified dural/epidural soft tissue masses again noted. The largest, along the left cerebral convexity, has two lobulations, each measuring up to 3.6 cm. 6 mm of rightward midline shift noted, somewhat improved from the prior MRI. 2. Additional known tumors are not well seen on noncontrast CT. 3. Mild small vessel ischemic microangiopathy. Electronically Signed   By: Garald Balding M.D.   On: 03/11/2016 01:54   Mr Brain Wo Contrast  03/11/2016  CLINICAL DATA:  65 year old hypertensive male with history of prostate cancer presenting with confusion and acute onset of slurred speech. Completion of brain radiation therapy April of 2017. Subsequent encounter. EXAM: MRI HEAD WITHOUT CONTRAST TECHNIQUE: Multiplanar, multiecho pulse sequences of the brain and surrounding structures were obtained without intravenous contrast. COMPARISON:  03/11/2016 CT.  01/22/2016 MR. FINDINGS: The present exam is motion degraded with only 5 sequences were able to be obtained prior to patient aborting examination. No acute infarct or obvious intracranial hemorrhage. Osseous metastatic disease. Prominent left hemisphere dural extension  with mass effect upon left hemisphere has improved when compared to the 01/22/2016 examination. Maximal thickness of left hemispheric dural extension 1.9 cm versus prior 2.7 cm. There remains mass-effect upon the left lateral ventricle with midline shift to the 5.6 mm midline shift to the right versus prior 11.7 mm of midline shift the right. IMPRESSION: Limited exam as noted above without evidence of acute infarct. When compared to the 01/22/2016 examination, there has been interval improvement although incomplete resolution of significant left hemispheric dural extension of metastatic disease with continued mass effect upon the left lateral ventricle and midline shift to the right (currently 5.6 mm midline shift to the right versus prior 11.7 mm midline shift to the right). Electronically Signed   By: Genia Del M.D.   On: 03/11/2016 11:40   US Renal  03/12/2016  CLINICAL DATA:  65 year old male with history of acute renal failure. History of prostate cancer. Hypertension. EXAM: RENAL / URINARY TRACT ULTRASOUND COMPLETE COMPARISON:  No priors. FINDINGS: Right Kidney: Length: 12.8 cm. Echogenicity within normal limits. No mass or hydronephrosis visualized. Left Kidney: Length: 12.8 cm. Echogenicity within normal limits. Small amount of perinephric fluid adjacent to the lower pole of the left kidney. No mass or hydronephrosis visualized. Bladder: Appears normal for degree of bladder distention. IMPRESSION: 1. Generally normal sonographic appearance of the kidneys and urinary bladder. There is a small amount of perinephric fluid adjacent to the lower pole of the left kidney, which is nonspecific. Electronically Signed   By: Vinnie Langton M.D.   On: 03/12/2016 09:21     LOS: 3 days   Thurnell Lose, MD  [facility] Pager:[phone]  If 7PM-7AM, please contact night-coverage [email or web address] Password [email or web address]  03/14/2016, 9:23 AM

## 2016-03-15 ENCOUNTER — Other Ambulatory Visit: Payer: Self-pay | Admitting: Oncology

## 2016-03-15 ENCOUNTER — Ambulatory Visit: Payer: Managed Care, Other (non HMO)

## 2016-03-15 ENCOUNTER — Inpatient Hospital Stay (HOSPITAL_COMMUNITY): Payer: Managed Care, Other (non HMO)

## 2016-03-15 DIAGNOSIS — I509 Heart failure, unspecified: Secondary | ICD-10-CM

## 2016-03-15 LAB — COMPREHENSIVE METABOLIC PANEL
ALT: 18 U/L (ref 17–63)
ANION GAP: 6 (ref 5–15)
AST: 21 U/L (ref 15–41)
Albumin: 2.8 g/dL — ABNORMAL LOW (ref 3.5–5.0)
Alkaline Phosphatase: 1477 U/L — ABNORMAL HIGH (ref 38–126)
BILIRUBIN TOTAL: 0.7 mg/dL (ref 0.3–1.2)
BUN: 32 mg/dL — AB (ref 6–20)
CHLORIDE: 112 mmol/L — AB (ref 101–111)
CO2: 24 mmol/L (ref 22–32)
Calcium: 6.9 mg/dL — ABNORMAL LOW (ref 8.9–10.3)
Creatinine, Ser: 2.56 mg/dL — ABNORMAL HIGH (ref 0.61–1.24)
GFR, EST AFRICAN AMERICAN: 28 mL/min — AB (ref >60)
GFR, EST NON AFRICAN AMERICAN: 25 mL/min — AB (ref >60)
Glucose, Bld: 103 mg/dL — ABNORMAL HIGH (ref 65–99)
POTASSIUM: 3.9 mmol/L (ref 3.5–5.1)
Sodium: 142 mmol/L (ref 135–145)
TOTAL PROTEIN: 6.3 g/dL — AB (ref 6.5–8.1)

## 2016-03-15 LAB — ECHOCARDIOGRAM COMPLETE
HEIGHTINCHES: 71 in
WEIGHTICAEL: 3331.2 [oz_av]

## 2016-03-15 LAB — PHOSPHORUS: PHOSPHORUS: 1.7 mg/dL — AB (ref 2.5–4.6)

## 2016-03-15 MED ORDER — VITAMIN D (ERGOCALCIFEROL) 1.25 MG (50000 UNIT) PO CAPS
50000.0000 [IU] | ORAL_CAPSULE | ORAL | Status: DC
  Administered 2016-03-15: 50000 [IU] via ORAL
  Filled 2016-03-15: qty 1

## 2016-03-15 MED ORDER — CALCIUM GLUCONATE 10 % IV SOLN
2.0000 g | Freq: Once | INTRAVENOUS | Status: AC
  Administered 2016-03-15: 2 g via INTRAVENOUS
  Filled 2016-03-15: qty 20

## 2016-03-15 MED ORDER — LIDOCAINE HCL 1 % IJ SOLN
INTRAMUSCULAR | Status: DC | PRN
  Administered 2016-03-15: 5 mL

## 2016-03-15 MED ORDER — SODIUM CHLORIDE 0.9 % IV SOLN
2.0000 g | Freq: Once | INTRAVENOUS | Status: AC
  Administered 2016-03-15: 2 g via INTRAVENOUS
  Filled 2016-03-15: qty 20

## 2016-03-15 NOTE — Progress Notes (Signed)
Occupational Therapy Treatment Patient Details Name: Steven DICOLA Sr. MRN: [id number] DOB: [birth year] Today's Date: 03/15/2016    History of present illness 65 y.o. 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)-s/p recent whole brain radiation admitted with acute encephalopathy and HCAP   OT comments  Pt making functional gains with OT with each treatment  Follow Up Recommendations  Supervision/Assistance - 24 hour;Home health OT    Equipment Recommendations  3 in 1 bedside comode    Recommendations for Other Services      Precautions / Restrictions Precautions Precautions: Fall Restrictions Weight Bearing Restrictions: No       Mobility Bed Mobility               General bed mobility comments: pt in chair  Transfers Overall transfer level: Needs assistance   Transfers: Sit to/from Stand;Stand Pivot Transfers Sit to Stand: Supervision Stand pivot transfers: Supervision       General transfer comment: overall S this day        ADL       Grooming: Standing;Set up               Lower Body Dressing: Supervision/safety;Sit to/from stand;Cueing for safety   Toilet Transfer: Supervision/safety;Regular Toilet   Toileting- Water quality scientist and Hygiene: Supervision/safety;Sit to/from stand       Functional mobility during ADLs: Supervision/safety General ADL Comments: pt overall S this day.  Pt improving with OT                Cognition   Behavior During Therapy: WFL for tasks assessed/performed Overall Cognitive Status: Within Functional Limits for tasks assessed (mostly:  cues for safety)                                    Pertinent Vitals/ Pain       Pain Assessment: No/denies pain         Frequency Min 2X/week     Progress Toward Goals  OT Goals(current goals can now be found in the care plan section)  Progress towards OT goals: Progressing toward goals      Plan Discharge plan remains appropriate       End of Session     Activity Tolerance Patient tolerated treatment well   Patient Left  in chair with chair alarm set and nursing student present   Nurse Communication  needs to walk with nursing         Time: [id number] OT Time Calculation (min): 13 min  Charges: OT General Charges $OT Visit: 1 Procedure OT Treatments $Self Care/Home Management : 8-22 mins  [place], Edwena Felty D 03/15/2016, 12:17 PM

## 2016-03-15 NOTE — Procedures (Signed)
RIJV tunneled PICC SVC RA SL 22 cm No comp

## 2016-03-15 NOTE — Care Management Note (Signed)
Case Management Note  Patient Details  Name: Steven DESPAIN Sr. MRN: [id number] Date of Birth: [birth year]  Subjective/Objective:  Noted IJ placed; long term iv abx. Spoke to patient who agreed that I can contact his sister Steven Church, & son Steven Church about d/c plans-Patient stays with his sister Steven Church most of the day, there is family in & out of the house all day, so patient is safe @ sister's house, she lives 4 blocks from patient's home(patient lives @ his home alone). Son, & sister manages his meds,& pcp appts. Provided with [facility] agency list as resource-will await choice.  Will need iv abx script, & HHRn-iv abx order,[id number].                    Action/Plan:d/c plan home w/HHC.   Expected Discharge Date:   (UNKNOWN)               Expected Discharge Plan:  [place]  In-House Referral:     Discharge planning Services  CM Consult  Post Acute Care Choice:    Choice offered to:  Adult Children, Sibling  DME Arranged:    DME Agency:     HH Arranged:    HH Agency:     Status of Service:  In process, will continue to follow  Medicare Important Message Given:    Date Medicare IM Given:    Medicare IM give by:    Date Additional Medicare IM Given:    Additional Medicare Important Message give by:     If discussed at [place] of Stay Meetings, dates discussed:    Additional Comments:  Dessa Phi, RN 03/15/2016, 1:03 PM

## 2016-03-15 NOTE — Care Management Note (Signed)
Case Management Note  Patient Details  Name: Steven DUFFORD Sr. MRN: [id number] Date of Birth: [birth year]  Subjective/Objective: Spoke to [facility] chosen for [facility]. [facility] rep Santiago Glad, & iv therapy liason Pam aware-await HHC orders,IV abx script.They will coordinate assessment,& teaching w/primary caregivers, all info given.                   Action/Plan:d/c plan home w/HHC/iv abx.   Expected Discharge Date:   (UNKNOWN)               Expected Discharge Plan:  [place]  In-House Referral:     Discharge planning Services  CM Consult  Post Acute Care Choice:    Choice offered to:  Adult Children, Sibling  DME Arranged:    DME Agency:     HH Arranged:    HH Agency:  [facility]  Status of Service:  In process, will continue to follow  Medicare Important Message Given:    Date Medicare IM Given:    Medicare IM give by:    Date Additional Medicare IM Given:    Additional Medicare Important Message give by:     If discussed at [place] of Stay Meetings, dates discussed:    Additional Comments:  Dessa Phi, RN 03/15/2016, 3:08 PM

## 2016-03-15 NOTE — Progress Notes (Signed)
Nutrition Follow-up  DOCUMENTATION CODES:   Not applicable  INTERVENTION:  -Ensure Enlive po BID, each supplement provides 350 kcal and 20 grams of protein -Diet has been advanced to regular by SLP  NUTRITION DIAGNOSIS:   Inadequate oral intake related to lethargy/confusion, poor appetite as evidenced by per patient/family report. -ongoing  GOAL:   Patient will meet greater than or equal to 90% of their needs -meeting  MONITOR:   PO intake, Labs, I & O's, Skin, Supplement acceptance  REASON FOR ASSESSMENT:   Malnutrition Screening Tool    ASSESSMENT:   Steven GREIS Sr. is a 65 y.o. male with medical history significant of metastatic prostate cancer stage IV status post cranial radiation last month on Zytiga was brought to the ER the patient was found to be confused last evening at home by patient's family.  Steven Church diet has been advanced to regular, but per SLP, he was recommended soft foods. He was not able to order egg salad on soft diet and thus was advanced, but is ordering soft foods at his own discretion. Speech is improving.   He was able to communicate better today, but was unable to recall what he had for breakfast. Documented PO intake 100%  Denies nausea Denies vomiting Weight up 2 kgs on admission.  Labs and medications reviewed: BUN 32, Cr 2.56, EGFR 28; Ca 6.9 Vitamin D 50K IU Q48H; Calcium carbonate 1052m PO TID  Diet Order:  Diet regular Room service appropriate?: Yes; Fluid consistency:: Thin  Skin:  Reviewed, no issues  Last BM:  PTA  Height:   Ht Readings from Last 1 Encounters:  03/10/16 '5\' 11"'  (1.803 m)    Weight:   Wt Readings from Last 1 Encounters:  03/15/16 208 lb 3.2 oz (94.439 kg)    Ideal Body Weight:  78.18 kg  BMI:  Body mass index is 29.05 kg/(m^2).  Estimated Nutritional Needs:   Kcal:  [address] Protein:  100-120 grams  Fluid:  >/= 2.3L  EDUCATION NEEDS:   No education needs identified at this  time  WSatira Anis Lynnmarie Lovett, MS, RD LDN Inpatient Clinical Dietitian Pager [phone]

## 2016-03-15 NOTE — Progress Notes (Signed)
Echocardiogram 2D Echocardiogram has been performed.  Steven Church 03/15/2016, 11:27 AM

## 2016-03-15 NOTE — Progress Notes (Addendum)
PROGRESS NOTE        PATIENT DETAILS Name: Steven Church. Age: 65 y.o. Sex: male Date of Birth: 1951-04-11 Admit Date: 03/10/2016 Admitting Physician Rise Patience, MD JEH:[id number], DAVID Audelia Acton, PA-C Outpatient Specialists:Dr [name], Dr Tammi Klippel  Brief Narrative:  Patient is a 65 y.o. 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 No belly pain.  Assessment/Plan:  Acute encephalopathy: Multifactorial- metabolic encephalopathy from PNA, and dural/brain metastasis. Mentation has considerably improved. MRI brain shows improved metastasis but still some mass effect, EEG unremarkable, was seen by neurology. Was placed on IV Decadron and since then he has shown remarkable improvement, we'll switch to oral Decadron 03/13/2016, DW Neuro Dr Leonel Ramsay on 03/13/16. We will likely discharge on Decadron.  Expressive Aphasia: mostly expressive-able to say a few words-at times appears dysarthric as well. Nonfocal exam, as above MRI nonacute but much improved clinically after Decadron trial which is continued.  HCAP/Aspiration PNA with strep viridans bacteremia: Blood cultures positive for strep, clinically better, taper down to Rocephin on 03/13/2016, repeat surveillance cultures remain negative will place PICC line for total 14 day treatment, echo ordered and pending, case discussed with ID physician Dr. Johnnye Sima over the weekend, being followed by speech currently on dysphagia 3 diet.  Hypocalcemia: PTH appropriately high, Vit D low, high Calcitriol, placed on both oral calcitriol and ergocholecalciferol after discussions with the nephrologist on call Dr. Posey Pronto and endocrinologist Dr. Loanne Drilling over the phone. Continue  IV calcium supplementation and monitor. He will follow with endocrinology in the office within a week after discharge.  Anemia: likely related to malignancy/chronic disease. Follow closely  OVZ:[id number] scheduled IV Lopressor and use prn IV hydralazine. Hold all oral anti-hypertensive's till seen by Speech therapy   Elevated Alk Phosphatase: chronic issue-likely due to bone mets-abdomen is soft and non tender today  Advanced prostate cancer with diffuse bony mets/bulky lymphadenopathy/dural mets: followed by Dr [name] and Dr Tammi Klippel. Recently completed whole brain radiation. On Zytiga. MRI as above, we will discussed with his oncologist on Monday again.   ARF - vancomycin toxicity, offending medications stopped, renal ultrasound stable, gently hydrate and monitor.    DVT Prophylaxis: Lovenox  Code Status: Full code   Family Communication: Sister, step daughter at bedside by previous M.D.  Disposition Plan: Remain inpatient-suspect requires 2-3 days more of inpatient hospitalization  Antimicrobial agents: IV Vancomycin 5/26>> 5/27 IV Zosyn 5/26>> 5/28 IV Rocephin 5/28  Procedures: None  CONSULTS:  None  Time spent: 25 minutes-Greater than 50% of this time was spent in counseling, explanation of diagnosis, planning of further management, and coordination of care.  MEDICATIONS: Anti-infectives    Start     Dose/Rate Route Frequency Ordered Stop   03/13/16 1100  cefTRIAXone (ROCEPHIN) 2 g in dextrose 5 % 50 mL IVPB - Premix     2 g 100 mL/hr over 30 Minutes Intravenous Daily 03/13/16 0959     03/12/16 0600  azithromycin (ZITHROMAX) 500 mg in dextrose 5 % 250 mL IVPB  Status:  Discontinued     500 mg 250 mL/hr over 60 Minutes Intravenous Every 24 hours 03/11/16 0625 03/11/16 0643   03/11/16 2200  cefTRIAXone (ROCEPHIN) 1 g in dextrose 5 % 50 mL IVPB  Status:  Discontinued     1 g 100 mL/hr over 30 Minutes Intravenous Every 24 hours 03/11/16 0626 03/11/16 0643     03/11/16 0645  vancomycin (VANCOCIN) IVPB 1000 mg/200 mL premix  Status:  Discontinued     1,000 mg 200 mL/hr over 60 Minutes Intravenous Every 8 hours 03/11/16 0643 03/12/16 0901   03/11/16 0645  piperacillin-tazobactam (ZOSYN) IVPB 3.375 g  Status:  Discontinued     3.375 g 12.5 mL/hr over 240 Minutes Intravenous Every 8 hours 03/11/16 0643 03/13/16 0959   03/11/16 0430  acyclovir (ZOVIRAX) 930 mg in dextrose 5 % 150 mL IVPB  Status:  Discontinued     930 mg 168.6 mL/hr over 60 Minutes Intravenous Every 8 hours 03/11/16 0335 03/11/16 0614   03/11/16 0130  cefTRIAXone (ROCEPHIN) 1 g in dextrose 5 % 50 mL IVPB     1 g 100 mL/hr over 30 Minutes Intravenous  Once 03/11/16 0123 03/11/16 0258   03/11/16 0130  azithromycin (ZITHROMAX) 500 mg in dextrose 5 % 250 mL IVPB  Status:  Discontinued     500 mg 250 mL/hr over 60 Minutes Intravenous  Once 03/11/16 0123 03/11/16 0755      Scheduled Meds: . antiseptic oral rinse  7 mL Mouth Rinse BID  . calcitRIOL  0.5 mcg Oral BID  . calcium carbonate  1,000 mg of elemental calcium Oral TID  . calcium gluconate  2 g Intravenous Once  . cefTRIAXone (ROCEPHIN)  IV  2 g Intravenous Daily  . cholecalciferol  5,000 Units Oral Daily  . dexamethasone  6 mg Oral Daily  . enoxaparin (LOVENOX) injection  40 mg Subcutaneous Q24H  . feeding supplement (ENSURE ENLIVE)  237 mL Oral BID BM  . metoprolol  5 mg Intravenous Q8H   Continuous Infusions:   PRN Meds:.fluticasone, hydrALAZINE   PHYSICAL EXAM: Vital signs: Filed Vitals:   03/14/16 0611 03/14/16 1329 03/14/16 2122 03/15/16 0525  BP: 135/86 157/86 145/94 158/84  Pulse: 75 78 65 66  Temp: 98.5 F (36.9 C) 98.7 F (37.1 C) 98.8 F (37.1 C) 97.9 F (36.6 C)  TempSrc: Oral Oral Oral Oral  Resp: '18 18 18 18  ' Height:      Weight: 94.257 kg (207 lb 12.8 oz)   94.439 kg (208 lb 3.2 oz)  SpO2: 98% 100% 99% 100%   Filed Weights   03/10/16 2327 03/14/16 0611 03/15/16 0525  Weight: 92.987 kg (205  lb) 94.257 kg (207 lb 12.8 oz) 94.439 kg (208 lb 3.2 oz)   Body mass index is 29.05 kg/(m^2).   Gen Exam: Awake and alert-follows all commands- much improved expressive aphasia   Neck: Supple, No JVD.   Chest: B/L Clear.   CVS: S1 S2 Regular, no murmurs.  Abdomen: soft, BS +, non tender, non distended.  Extremities: no edema, lower extremities warm to touch. Neurologic: Non Focal.   Skin: No Rash or lesions   Wounds: N/A.    LABORATORY DATA: CBC:  Recent Labs Lab 03/10/16 2342 03/11/16 0643 03/12/16 0522 03/13/16 0524  WBC 9.4 9.1 11.0* 8.8  NEUTROABS 6.5 6.2  --   --   HGB 10.0* 8.9* 10.1* 9.8*  HCT 29.4* 26.1* 29.7* 28.6*  MCV 80.8 82.1 80.9 80.1  PLT 350 289 331 474    Basic Metabolic Panel:  Recent Labs Lab 03/10/16 2342 03/11/16 2595  03/12/16 0522 03/12/16 1311 03/13/16 0524 03/14/16 0416 03/15/16  0434  NA 138 134*  < > 136 137 138 142 142  K 2.6* 2.6*  < > 3.5 3.6 3.7 3.4* 3.9  CL 105 102  < > 105 108 108 111 112*  CO2 21* 21*  < > 20* 18* 21* 22 24  GLUCOSE 75 87  < > 122* 115* 141* 112* 103*  BUN <5* <5*  < > 10 15 22* 35* 32*  CREATININE 0.76 0.69  < > 1.73* 2.21* 2.58* 2.74* 2.56*  CALCIUM 6.3* 6.0*  < > 5.9* 6.4* 5.8* 6.4* 6.9*  MG 1.6* 2.0  --  1.8  --  2.3  --   --   < > = values in this interval not displayed.  GFR: Estimated Creatinine Clearance: 33.3 mL/min (by C-G formula based on Cr of 2.56).  Liver Function Tests:  Recent Labs Lab 03/11/16 9169 03/12/16 0522 03/13/16 0524 03/14/16 0416 03/15/16 0434  AST '27 28 22 26 21  ' ALT 17 17 16* 17 18  ALKPHOS 1662* 901* 1561* 1455* 1477*  BILITOT 1.0 1.3* 0.9 0.4 0.7  PROT 6.1* 6.6 6.2* 6.1* 6.3*  ALBUMIN 2.8* 2.9* 2.7* 2.8* 2.8*    Recent Labs Lab 03/11/16 0118  LIPASE 17    Recent Labs Lab 03/11/16 0118  AMMONIA 19    Coagulation Profile: No results for input(s): INR, PROTIME in the last 168 hours.  Cardiac Enzymes: No results for input(s): CKTOTAL, CKMB, CKMBINDEX,  TROPONINI in the last 168 hours.  BNP (last 3 results) No results for input(s): PROBNP in the last 8760 hours.  HbA1C: No results for input(s): HGBA1C in the last 72 hours.  CBG: No results for input(s): GLUCAP in the last 168 hours.  Lipid Profile: No results for input(s): CHOL, HDL, LDLCALC, TRIG, CHOLHDL, LDLDIRECT in the last 72 hours.  Thyroid Function Tests: No results for input(s): TSH, T4TOTAL, FREET4, T3FREE, THYROIDAB in the last 72 hours.  Anemia Panel: No results for input(s): VITAMINB12, FOLATE, FERRITIN, TIBC, IRON, RETICCTPCT in the last 72 hours.  Urine analysis:    Component Value Date/Time   COLORURINE YELLOW 03/11/2016 0146   APPEARANCEUR CLEAR 03/11/2016 0146   LABSPEC 1.014 03/11/2016 0146   PHURINE 6.0 03/11/2016 0146   GLUCOSEU NEGATIVE 03/11/2016 0146   HGBUR TRACE* 03/11/2016 0146   BILIRUBINUR NEGATIVE 03/11/2016 0146   BILIRUBINUR neg 04/24/2015 1509   KETONESUR >80* 03/11/2016 0146   PROTEINUR NEGATIVE 03/11/2016 0146   PROTEINUR neg 04/24/2015 1509   UROBILINOGEN negative 04/24/2015 1509   NITRITE NEGATIVE 03/11/2016 0146   NITRITE neg 04/24/2015 1509   LEUKOCYTESUR NEGATIVE 03/11/2016 0146    Sepsis Labs: Lactic Acid, Venous    Component Value Date/Time   LATICACIDVEN 0.7 03/11/2016 0635    MICROBIOLOGY: Recent Results (from the past 240 hour(s))  Culture, blood (Routine x 2)     Status: Abnormal   Collection Time: 03/10/16 11:42 PM  Result Value Ref Range Status   Specimen Description BLOOD BLOOD LEFT FOREARM  Final   Special Requests BOTTLES DRAWN AEROBIC AND ANAEROBIC 5 CC  Final   Culture  Setup Time   Final    GRAM POSITIVE COCCI IN CHAINS ANAEROBIC BOTTLE ONLY CRITICAL RESULT CALLED TO, READ BACK BY AND VERIFIED WITH: T. Pickering Pharm.D. 20:05 03/11/16 ([email or web address])    Culture (A)  Final    VIRIDANS STREPTOCOCCUS THE SIGNIFICANCE OF ISOLATING THIS ORGANISM FROM A SINGLE SET OF BLOOD CULTURES WHEN MULTIPLE SETS ARE DRAWN IS  UNCERTAIN. PLEASE NOTIFY [place]  WITHIN ONE WEEK IF SPECIATION AND SENSITIVITIES ARE REQUIRED. Performed at [facility]    Report Status 03/13/2016 FINAL  Final  Blood Culture ID Panel (Reflexed)     Status: Abnormal   Collection Time: 03/10/16 11:42 PM  Result Value Ref Range Status   Enterococcus species NOT DETECTED NOT DETECTED Final   Vancomycin resistance NOT DETECTED NOT DETECTED Final   Listeria monocytogenes NOT DETECTED NOT DETECTED Final   Staphylococcus species NOT DETECTED NOT DETECTED Final   Staphylococcus aureus NOT DETECTED NOT DETECTED Final   Methicillin resistance NOT DETECTED NOT DETECTED Final   Streptococcus species DETECTED (A) NOT DETECTED Final    Comment: CRITICAL RESULT CALLED TO, READ BACK BY AND VERIFIED WITH: T. Pickering Pharm.D. 20:05 03/11/16 ([email or web address])    Streptococcus agalactiae NOT DETECTED NOT DETECTED Final   Streptococcus pneumoniae NOT DETECTED NOT DETECTED Final   Streptococcus pyogenes NOT DETECTED NOT DETECTED Final   Acinetobacter baumannii NOT DETECTED NOT DETECTED Final   Enterobacteriaceae species NOT DETECTED NOT DETECTED Final   Enterobacter cloacae complex NOT DETECTED NOT DETECTED Final   Escherichia coli NOT DETECTED NOT DETECTED Final   Klebsiella oxytoca NOT DETECTED NOT DETECTED Final   Klebsiella pneumoniae NOT DETECTED NOT DETECTED Final   Proteus species NOT DETECTED NOT DETECTED Final   Serratia marcescens NOT DETECTED NOT DETECTED Final   Carbapenem resistance NOT DETECTED NOT DETECTED Final   Haemophilus influenzae NOT DETECTED NOT DETECTED Final   Neisseria meningitidis NOT DETECTED NOT DETECTED Final   Pseudomonas aeruginosa NOT DETECTED NOT DETECTED Final   Candida albicans NOT DETECTED NOT DETECTED Final   Candida glabrata NOT DETECTED NOT DETECTED Final   Candida krusei NOT DETECTED NOT DETECTED Final   Candida parapsilosis NOT DETECTED NOT DETECTED Final   Candida tropicalis NOT DETECTED  NOT DETECTED Final  Culture, blood (Routine x 2)     Status: None (Preliminary result)   Collection Time: 03/11/16 12:33 AM  Result Value Ref Range Status   Specimen Description BLOOD RIGHT HAND  Final   Special Requests IN PEDIATRIC BOTTLE 1CC  Final   Culture   Final    NO GROWTH 3 DAYS Performed at [facility]    Report Status PENDING  Incomplete  Urine culture     Status: None   Collection Time: 03/11/16  1:46 AM  Result Value Ref Range Status   Specimen Description URINE, CLEAN CATCH  Final   Special Requests NONE  Final   Culture NO GROWTH Performed at [facility]   Final   Report Status 03/12/2016 FINAL  Final  Culture, blood (routine x 2)     Status: None (Preliminary result)   Collection Time: 03/13/16 10:25 AM  Result Value Ref Range Status   Specimen Description BLOOD LEFT ARM  10 ML IN [facility] BOTTLE  Final   Special Requests NONE  Final   Culture   Final    NO GROWTH < 24 HOURS Performed at [facility]    Report Status PENDING  Incomplete  Culture, blood (routine x 2)     Status: None (Preliminary result)   Collection Time: 03/13/16 10:30 AM  Result Value Ref Range Status   Specimen Description BLOOD LEFT ARM  10 ML IN [facility] BOTTLE  Final   Special Requests NONE  Final   Culture   Final    NO GROWTH < 24 HOURS Performed at [facility]    Report Status PENDING  Incomplete  RADIOLOGY STUDIES/RESULTS: Dg Chest 2 View  03/11/2016  CLINICAL DATA:  Acute onset of confusion and lethargy. Current history of pancreatic cancer. Initial encounter. EXAM: CHEST  2 VIEW COMPARISON:  Chest radiograph performed 02/27/2015, and PET/CT performed 03/20/2015 FINDINGS: The lungs are well-aerated. Right peripheral and left midlung airspace opacities raise concern for pneumonia. Vascular congestion is noted. A small left pleural effusion is noted. No pneumothorax is seen. The heart is borderline normal in size. No acute osseous abnormalities are seen.  IMPRESSION: Right peripheral and left midlung airspace opacities raise concern for multifocal pneumonia. Vascular congestion and small left pleural effusion noted. Followup PA and lateral chest X-ray is recommended in 3-4 weeks following trial of antibiotic therapy to ensure resolution and exclude underlying malignancy. Electronically Signed   By: Garald Balding M.D.   On: 03/11/2016 00:50   Ct Head Wo Contrast  03/11/2016  CLINICAL DATA:  Acute onset of slurred speech. Known brain metastases from pancreatic cancer. Initial encounter. EXAM: CT HEAD WITHOUT CONTRAST TECHNIQUE: Contiguous axial images were obtained from the base of the skull through the vertex without intravenous contrast. COMPARISON:  MRI of the brain performed 01/22/2016 FINDINGS: There is no evidence of acute infarction, or intra- or extra-axial hemorrhage on CT. A large partially calcified dural/epidural soft tissue mass is again noted along the left cerebral convexity, with two lobulations each measuring up to 3.6 cm. Approximately 6 mm of rightward midline shift is seen; the degree of midline shift appears somewhat improved. Additional tumor anterior to the left temporal lobe is partially characterized. Additional known tumors are not well seen on CT. Mild subcortical white matter change likely reflects small vessel ischemic microangiopathy. The brainstem and fourth ventricle are within normal limits. The basal ganglia are unremarkable in appearance. There is no evidence of fracture; visualized osseous structures are unremarkable in appearance. The orbits are within normal limits. The paranasal sinuses and mastoid air cells are well-aerated. No significant soft tissue abnormalities are seen. IMPRESSION: 1. Large partially calcified dural/epidural soft tissue masses again noted. The largest, along the left cerebral convexity, has two lobulations, each measuring up to 3.6 cm. 6 mm of rightward midline shift noted, somewhat improved from the  prior MRI. 2. Additional known tumors are not well seen on noncontrast CT. 3. Mild small vessel ischemic microangiopathy. Electronically Signed   By: Garald Balding M.D.   On: 03/11/2016 01:54   Mr Brain Wo Contrast  03/11/2016  CLINICAL DATA:  65 year old hypertensive male with history of prostate cancer presenting with confusion and acute onset of slurred speech. Completion of brain radiation therapy April of 2017. Subsequent encounter. EXAM: MRI HEAD WITHOUT CONTRAST TECHNIQUE: Multiplanar, multiecho pulse sequences of the brain and surrounding structures were obtained without intravenous contrast. COMPARISON:  03/11/2016 CT.  01/22/2016 MR. FINDINGS: The present exam is motion degraded with only 5 sequences were able to be obtained prior to patient aborting examination. No acute infarct or obvious intracranial hemorrhage. Osseous metastatic disease. Prominent left hemisphere dural extension with mass effect upon left hemisphere has improved when compared to the 01/22/2016 examination. Maximal thickness of left hemispheric dural extension 1.9 cm versus prior 2.7 cm. There remains mass-effect upon the left lateral ventricle with midline shift to the 5.6 mm midline shift to the right versus prior 11.7 mm of midline shift the right. IMPRESSION: Limited exam as noted above without evidence of acute infarct. When compared to the 01/22/2016 examination, there has been interval improvement although incomplete resolution of significant left hemispheric dural extension  of metastatic disease with continued mass effect upon the left lateral ventricle and midline shift to the right (currently 5.6 mm midline shift to the right versus prior 11.7 mm midline shift to the right). Electronically Signed   By: Genia Del M.D.   On: 03/11/2016 11:40   US Renal  03/12/2016  CLINICAL DATA:  65 year old male with history of acute renal failure. History of prostate cancer. Hypertension. EXAM: RENAL / URINARY TRACT ULTRASOUND  COMPLETE COMPARISON:  No priors. FINDINGS: Right Kidney: Length: 12.8 cm. Echogenicity within normal limits. No mass or hydronephrosis visualized. Left Kidney: Length: 12.8 cm. Echogenicity within normal limits. Small amount of perinephric fluid adjacent to the lower pole of the left kidney. No mass or hydronephrosis visualized. Bladder: Appears normal for degree of bladder distention. IMPRESSION: 1. Generally normal sonographic appearance of the kidneys and urinary bladder. There is a small amount of perinephric fluid adjacent to the lower pole of the left kidney, which is nonspecific. Electronically Signed   By: Vinnie Langton M.D.   On: 03/12/2016 09:21     LOS: 4 days   Thurnell Lose, MD  [facility] Pager:[phone]  If 7PM-7AM, please contact night-coverage [email or web address] Password [email or web address] 03/15/2016, 9:28 AM

## 2016-03-16 LAB — COMPREHENSIVE METABOLIC PANEL
ALBUMIN: 2.9 g/dL — AB (ref 3.5–5.0)
ALT: 18 U/L (ref 17–63)
AST: 19 U/L (ref 15–41)
Alkaline Phosphatase: 1487 U/L — ABNORMAL HIGH (ref 38–126)
Anion gap: 9 (ref 5–15)
BUN: 36 mg/dL — ABNORMAL HIGH (ref 6–20)
CALCIUM: 7.3 mg/dL — AB (ref 8.9–10.3)
CHLORIDE: 110 mmol/L (ref 101–111)
CO2: 23 mmol/L (ref 22–32)
Creatinine, Ser: 2.4 mg/dL — ABNORMAL HIGH (ref 0.61–1.24)
GFR calc Af Amer: 31 mL/min — ABNORMAL LOW (ref >60)
GFR, EST NON AFRICAN AMERICAN: 27 mL/min — AB (ref >60)
GLUCOSE: 99 mg/dL (ref 65–99)
POTASSIUM: 3.7 mmol/L (ref 3.5–5.1)
Sodium: 142 mmol/L (ref 135–145)
TOTAL PROTEIN: 6.1 g/dL — AB (ref 6.5–8.1)
Total Bilirubin: 0.5 mg/dL (ref 0.3–1.2)

## 2016-03-16 LAB — CULTURE, BLOOD (ROUTINE X 2): CULTURE: NO GROWTH

## 2016-03-16 MED ORDER — CEFTRIAXONE SODIUM IN DEXTROSE 40 MG/ML IV SOLN: 2.0000 g | Freq: Every day | INTRAVENOUS | Status: DC

## 2016-03-16 MED ORDER — CALCIUM GLUCONATE 10 % IV SOLN
2.0000 g | Freq: Once | INTRAVENOUS | Status: AC
  Administered 2016-03-16: 2 g via INTRAVENOUS
  Filled 2016-03-16: qty 20

## 2016-03-16 MED ORDER — HEPARIN SOD (PORK) LOCK FLUSH 100 UNIT/ML IV SOLN
250.0000 [IU] | INTRAVENOUS | Status: AC | PRN
  Administered 2016-03-16: 250 [IU]

## 2016-03-16 MED ORDER — CALCIUM CARBONATE ANTACID 500 MG PO CHEW: 1000.0000 mg | CHEWABLE_TABLET | Freq: Three times a day (TID) | ORAL | Status: DC

## 2016-03-16 MED ORDER — CALCITRIOL 0.5 MCG PO CAPS: 0.5000 ug | ORAL_CAPSULE | Freq: Two times a day (BID) | ORAL | Status: DC

## 2016-03-16 MED ORDER — DEXAMETHASONE 4 MG PO TABS: 4.0000 mg | ORAL_TABLET | Freq: Every day | ORAL | Status: DC

## 2016-03-16 MED ORDER — AMLODIPINE BESYLATE 10 MG PO TABS: 10.0000 mg | ORAL_TABLET | Freq: Every day | ORAL | Status: DC

## 2016-03-16 MED ORDER — SODIUM CHLORIDE 0.9% FLUSH
10.0000 mL | INTRAVENOUS | Status: DC | PRN
  Administered 2016-03-16: 10 mL

## 2016-03-16 MED ORDER — VITAMIN D (ERGOCALCIFEROL) 1.25 MG (50000 UNIT) PO CAPS: 50000.0000 [IU] | ORAL_CAPSULE | ORAL | Status: DC

## 2016-03-16 NOTE — Discharge Summary (Addendum)
Steven BARSKY Sr., is a 65 y.o. male  DOB [birth year]  MRN [id number].  Admission date:  03/10/2016  Admitting Physician  Rise Patience, MD  Discharge Date:  03/16/2016   Primary MD  [name], PA-C  Recommendations for primary care physician for things to follow:   Check CBC, CMP and ionized calcium in 3-4 days.  Nose outpatient endocrine and oncology follow-up as below   Admission Diagnosis  Aphasia [R47.01] Hypokalemia [E87.6] Hypomagnesemia [E83.42] CAP (community acquired pneumonia) [J18.9] Dysarthria [R47.1]   Discharge Diagnosis  Aphasia [R47.01] Hypokalemia [E87.6] Hypomagnesemia [E83.42] CAP (community acquired pneumonia) [J18.9] Dysarthria [R47.1]    Principal Problem:   Acute encephalopathy Active Problems:   Essential hypertension   Prostate cancer metastatic to bone [facility])   Dysarthria   Chronic anemia   CAP (community acquired pneumonia)   Hypokalemia   Hypomagnesemia   HCAP (healthcare-associated pneumonia)   Advanced care planning/counseling discussion      Past Medical History  Diagnosis Date  . Hypertension   . Obesity   . Prostate cancer ([place]) 04/07/2015    bone mets, started oncology 03/2015  . Family history of cancer   . Former smoker   . Bone cancer ([place])   . Brain cancer [facility])     Past Surgical History  Procedure Laterality Date  . Colonoscopy      never       HPI  from the history and physical done on the day of admission:   Patient is a 65 y.o. 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   Hospital Course:    Acute encephalopathy: Multifactorial- metabolic encephalopathy from PNA, and dural/brain metastasis. Mentation  has considerably improved. MRI brain shows improved metastasis but still some mass effect, EEG unremarkable, was seen by neurology. Was placed on IV Decadron and since then he has shown remarkable improvement, we'll switch to oral Decadron 05/28/2017And discharged on the same, DW Neuro Dr Leonel Ramsay on 03/13/16 he agrees with the plan. We also reviewed patient's previous MRI.  Expressive Aphasia: mostly expressive-able to say a few words-at times appears dysarthric as well. Nonfocal exam, as above MRI nonacute but much improved clinically after Decadron trial , Discussed with neurologist Dr. Leonel Ramsay who agrees with Decadron, will discharge on Decadron. Outpatient follow-up with speech and PCP along with oncology.  HCAP/Aspiration PNA with strep viridans bacteremia: Blood cultures positive for strep, clinically better, taper down to Rocephin on 03/13/2016, repeat surveillance cultures remained negative will place PICC line for total 14 day treatment, Transthoracic echogram stable , case discussed with ID physician Dr. Johnnye Sima over the weekend, being followed by speech currently on dysphagia 3 diet. Note patient has a right IJ tunneled PICC line placed by IR, Kindly remove PICC line after IV antibiotics have finished.  Hypocalcemia: PTH appropriately high, Vit D low, high Calcitriol, placed on both oral calcitriol and ergocholecalciferol after discussions with the nephrologist on call Dr. Posey Pronto and  endocrinologist Dr. Loanne Drilling over the phone. Calcium levels are steadily coming up, give another 2 g of IV calcium before discharge, have placed him on oral calcium, vitamin D along with calcitriol as below. He will follow with endocrinology in the office within a week after discharge.  Anemia: likely related to malignancy/chronic disease. Stable, Follow closely in the outpatient setting.  HTN: Stable on Norvasc. ARB held due to renal failure.  Elevated Alk Phosphatase: chronic issue-likely due to bone  mets-abdomen is soft and non tender today  Advanced prostate cancer with diffuse bony mets/bulky lymphadenopathy/dural mets: followed by Dr [name] and Dr Tammi Klippel. Recently completed whole brain radiation. On Zytiga. MRI as above, Will place on Decadron for cerebral edema and discharge with outpatient oncology follow-up.   ARF - vancomycin toxicity, offending medications stopped, renal ultrasound stable, good urine output, creatinine and has been been steadily coming down, hold off ARB and potassium supplementation upon discharge, request PCP to check CMP in 3-4 days.       Follow UP  Follow-up Information    Follow up with [name], PA-C. Schedule an appointment as soon as possible for a visit in 1 week.   Specialty:  Family Medicine   Contact information:   [address] [place] [address] [phone]       Follow up with Renato Shin, MD. Schedule an appointment as soon as possible for a visit in 3 days.   Specialty:  Endocrinology   Why:  Severe hypocalcemia   Contact information:   [address] [place] [address] [phone]       Follow up with [facility], MD. Schedule an appointment as soon as possible for a visit in 1 week.   Specialty:  Oncology   Contact information:   [place] [address] [phone]        Consults obtained - Discussed with ID Dr. Johnnye Sima over the phone along with endocrine Dr. Loanne Drilling over the phone  Discharge Condition: Guarded  Diet and Activity recommendation: See Discharge Instructions below  Discharge Instructions           Discharge Instructions    Diet - low sodium heart healthy    Complete by:  As directed      Discharge instructions    Complete by:  As directed   Follow with Primary MD [name], PA-C in 7 days   Get CBC, CMP, 2 view Chest X ray checked  by Primary MD next visit.    Activity: As tolerated with Full fall precautions use walker/cane &  assistance as needed   Disposition Home    Diet:   Heart Healthy with feeding assistance and aspiration precautions.  For Heart failure patients - Check your Weight same time everyday, if you gain over 2 pounds, or you develop in leg swelling, experience more shortness of breath or chest pain, call your Primary MD immediately. Follow Cardiac Low Salt Diet and 1.5 lit/day fluid restriction.   On your next visit with your primary care physician please Get Medicines reviewed and adjusted.   Please request your Prim.MD to go over all Hospital Tests and Procedure/Radiological results at the follow up, please get all Hospital records sent to your Prim MD by signing hospital release before you go home.   If you experience worsening of your admission symptoms, develop shortness of breath, life threatening emergency, suicidal or homicidal thoughts you must seek medical attention immediately by calling 911 or calling  your MD immediately  if symptoms less severe.  You Must read complete instructions/literature along with all the possible adverse reactions/side effects for all the Medicines you take and that have been prescribed to you. Take any new Medicines after you have completely understood and accpet all the possible adverse reactions/side effects.   Do not drive, operate heavy machinery, perform activities at heights, swimming or participation in water activities or provide baby sitting services if your were admitted for syncope or siezures until you have seen by Primary MD or a Neurologist and advised to do so again.  Do not drive when taking Pain medications.    Do not take more than prescribed Pain, Sleep and Anxiety Medications  Special Instructions: If you have smoked or chewed Tobacco  in the last 2 yrs please stop smoking, stop any regular Alcohol  and or any Recreational drug use.  Wear Seat belts while driving.   Please note  You were cared for by a hospitalist during your  hospital stay. If you have any questions about your discharge medications or the care you received while you were in the hospital after you are discharged, you can call the unit and asked to speak with the hospitalist on call if the hospitalist that took care of you is not available. Once you are discharged, your primary care physician will handle any further medical issues. Please note that NO REFILLS for any discharge medications will be authorized once you are discharged, as it is imperative that you return to your primary care physician (or establish a relationship with a primary care physician if you do not have one) for your aftercare needs so that they can reassess your need for medications and monitor your lab values.     Increase activity slowly    Complete by:  As directed              Discharge Medications       Medication List    STOP taking these medications        losartan 100 MG tablet  Commonly known as:  COZAAR     potassium chloride SA 20 MEQ tablet  Commonly known as:  K-DUR,KLOR-CON      TAKE these medications        amLODipine 10 MG tablet  Commonly known as:  NORVASC  Take 1 tablet (10 mg total) by mouth daily.     BC FAST PAIN RELIEF PO  Take 1 Package by mouth as needed (pain). Reported on 01/21/2016     calcitRIOL 0.5 MCG capsule  Commonly known as:  ROCALTROL  Take 1 capsule (0.5 mcg total) by mouth 2 (two) times daily.     calcium carbonate 500 MG chewable tablet  Commonly known as:  TUMS - dosed in mg elemental calcium  Chew 5 tablets (1,000 mg of elemental calcium total) by mouth 3 (three) times daily.     calcium-vitamin D 250-100 MG-UNIT tablet  Take 1 tablet by mouth 2 (two) times daily.     cefTRIAXone 40 MG/ML IVPB  Commonly known as:  ROCEPHIN  Inject 50 mLs (2 g total) into the vein daily. Stop date 03/26/2016     dexamethasone 4 MG tablet  Commonly known as:  DECADRON  Take 1 tablet (4 mg total) by mouth daily.     finasteride 5  MG tablet  Commonly known as:  PROSCAR  Take 1 tablet (5 mg total) by mouth daily.     fluticasone 50 MCG/ACT  nasal spray  Commonly known as:  FLONASE  Place 2 sprays into both nostrils daily as needed for allergies. Reported on 01/21/2016     omeprazole 40 MG capsule  Commonly known as:  PRILOSEC  TAKE 1 CAPSULE (40 MG TOTAL) BY MOUTH DAILY.     tamsulosin 0.4 MG Caps capsule  Commonly known as:  FLOMAX  TAKE ONE CAPSULE BY MOUTH EVERY DAY     Vitamin D (Ergocalciferol) 50000 units Caps capsule  Commonly known as:  DRISDOL  Take 1 capsule (50,000 Units total) by mouth every other day.     ZYTIGA 250 MG tablet  Generic drug:  abiraterone Acetate  TAKE FOUR TABLETS BY MOUTH ONE TIME DAILY ON AN EMPTY STOMACH, AT LEAST 1 HOUR BEFORE AND 2 HOURS AFTER FOOD.        Major procedures and Radiology Reports - PLEASE review detailed and final reports for all details, in brief -   TTE  Left ventricle: The cavity size was normal. Systolic function was normal. The estimated ejection fraction was in the range of 55% to 60%. There is akinesis of the basalinferoseptal myocardium. Doppler parameters are consistent with abnormal left ventricular relaxation (grade 1 diastolic dysfunction). - Aortic valve: Trileaflet; mildly thickened, mildly calcified leaflets. - Left atrium: The atrium was mildly dilated. - Tricuspid valve: There was mild regurgitation.   Dg Chest 2 View  03/11/2016  CLINICAL DATA:  Acute onset of confusion and lethargy. Current history of pancreatic cancer. Initial encounter. EXAM: CHEST  2 VIEW COMPARISON:  Chest radiograph performed 02/27/2015, and PET/CT performed 03/20/2015 FINDINGS: The lungs are well-aerated. Right peripheral and left midlung airspace opacities raise concern for pneumonia. Vascular congestion is noted. A small left pleural effusion is noted. No pneumothorax is seen. The heart is borderline normal in size. No acute osseous abnormalities are seen. IMPRESSION:  Right peripheral and left midlung airspace opacities raise concern for multifocal pneumonia. Vascular congestion and small left pleural effusion noted. Followup PA and lateral chest X-ray is recommended in 3-4 weeks following trial of antibiotic therapy to ensure resolution and exclude underlying malignancy. Electronically Signed   By: Garald Balding M.D.   On: 03/11/2016 00:50   Ct Head Wo Contrast  03/11/2016  CLINICAL DATA:  Acute onset of slurred speech. Known brain metastases from pancreatic cancer. Initial encounter. EXAM: CT HEAD WITHOUT CONTRAST TECHNIQUE: Contiguous axial images were obtained from the base of the skull through the vertex without intravenous contrast. COMPARISON:  MRI of the brain performed 01/22/2016 FINDINGS: There is no evidence of acute infarction, or intra- or extra-axial hemorrhage on CT. A large partially calcified dural/epidural soft tissue mass is again noted along the left cerebral convexity, with two lobulations each measuring up to 3.6 cm. Approximately 6 mm of rightward midline shift is seen; the degree of midline shift appears somewhat improved. Additional tumor anterior to the left temporal lobe is partially characterized. Additional known tumors are not well seen on CT. Mild subcortical white matter change likely reflects small vessel ischemic microangiopathy. The brainstem and fourth ventricle are within normal limits. The basal ganglia are unremarkable in appearance. There is no evidence of fracture; visualized osseous structures are unremarkable in appearance. The orbits are within normal limits. The paranasal sinuses and mastoid air cells are well-aerated. No significant soft tissue abnormalities are seen. IMPRESSION: 1. Large partially calcified dural/epidural soft tissue masses again noted. The largest, along the left cerebral convexity, has two lobulations, each measuring up to 3.6 cm. 6 mm of  rightward midline shift noted, somewhat improved from the prior MRI. 2.  Additional known tumors are not well seen on noncontrast CT. 3. Mild small vessel ischemic microangiopathy. Electronically Signed   By: Garald Balding M.D.   On: 03/11/2016 01:54   Mr Brain Wo Contrast  03/11/2016  CLINICAL DATA:  65 year old hypertensive male with history of prostate cancer presenting with confusion and acute onset of slurred speech. Completion of brain radiation therapy April of 2017. Subsequent encounter. EXAM: MRI HEAD WITHOUT CONTRAST TECHNIQUE: Multiplanar, multiecho pulse sequences of the brain and surrounding structures were obtained without intravenous contrast. COMPARISON:  03/11/2016 CT.  01/22/2016 MR. FINDINGS: The present exam is motion degraded with only 5 sequences were able to be obtained prior to patient aborting examination. No acute infarct or obvious intracranial hemorrhage. Osseous metastatic disease. Prominent left hemisphere dural extension with mass effect upon left hemisphere has improved when compared to the 01/22/2016 examination. Maximal thickness of left hemispheric dural extension 1.9 cm versus prior 2.7 cm. There remains mass-effect upon the left lateral ventricle with midline shift to the 5.6 mm midline shift to the right versus prior 11.7 mm of midline shift the right. IMPRESSION: Limited exam as noted above without evidence of acute infarct. When compared to the 01/22/2016 examination, there has been interval improvement although incomplete resolution of significant left hemispheric dural extension of metastatic disease with continued mass effect upon the left lateral ventricle and midline shift to the right (currently 5.6 mm midline shift to the right versus prior 11.7 mm midline shift to the right). Electronically Signed   By: Genia Del M.D.   On: 03/11/2016 11:40   US Renal  03/12/2016  CLINICAL DATA:  65 year old male with history of acute renal failure. History of prostate cancer. Hypertension. EXAM: RENAL / URINARY TRACT ULTRASOUND COMPLETE  COMPARISON:  No priors. FINDINGS: Right Kidney: Length: 12.8 cm. Echogenicity within normal limits. No mass or hydronephrosis visualized. Left Kidney: Length: 12.8 cm. Echogenicity within normal limits. Small amount of perinephric fluid adjacent to the lower pole of the left kidney. No mass or hydronephrosis visualized. Bladder: Appears normal for degree of bladder distention. IMPRESSION: 1. Generally normal sonographic appearance of the kidneys and urinary bladder. There is a small amount of perinephric fluid adjacent to the lower pole of the left kidney, which is nonspecific. Electronically Signed   By: Vinnie Langton M.D.   On: 03/12/2016 09:21   Ir Fluoro Guide Cv Line Right  03/15/2016  INDICATION: Poor IV access.  Encephalopathy. EXAM: RIGHT JUGULAR TUNNELED PICC LINE PLACEMENT WITH ULTRASOUND AND FLUOROSCOPIC GUIDANCE MEDICATIONS: ; The antibiotic was administered within an appropriate time interval prior to skin puncture. ANESTHESIA/SEDATION: None FLUOROSCOPY TIME:  Fluoroscopy Time:  minutes 6 seconds (4 mGy). COMPLICATIONS: None immediate. PROCEDURE: The patient was advised of the possible risks andcomplications and agreed to undergo the procedure. The patient was then brought to the angiographic suite for the procedure. The right neck was prepped with chlorhexidine, draped in the usual sterile fashion using maximum barrier technique (cap and mask, sterile gown, sterile gloves, large sterile sheet, hand hygiene and cutaneous antiseptic). Local anesthesia was attained by infiltration with 1% lidocaine. Ultrasound demonstrated patency of the right jugular vein, and this was documented with an image. Under real-time ultrasound guidance, this vein was accessed with a 21 gauge micropuncture needle and image documentation was performed. The needle was exchanged over a guidewire for a peel-away sheath through which a 22 cm 5 [place] single lumen power injectable PICC was  advanced, and positioned with its tip  at the lower SVC/right atrial junction. The cuff was positioned in the subcutaneous tract. Fluoroscopy during the procedure and fluoro spot radiograph confirms appropriate catheter position. The catheter was flushed, secured to the skin with Prolene sutures, and covered with a sterile dressing. FINDINGS: Right jugular PICC tip is at the cavoatrial junction. IMPRESSION: Successful placement of a tunneled right jugular PICC with sonographic and fluoroscopic guidance. The catheter is ready for use. Electronically Signed   By: Marybelle Killings M.D.   On: 03/15/2016 17:14   Ir US Guide Vasc Access Right  03/15/2016  INDICATION: Poor IV access.  Encephalopathy. EXAM: RIGHT JUGULAR TUNNELED PICC LINE PLACEMENT WITH ULTRASOUND AND FLUOROSCOPIC GUIDANCE MEDICATIONS: ; The antibiotic was administered within an appropriate time interval prior to skin puncture. ANESTHESIA/SEDATION: None FLUOROSCOPY TIME:  Fluoroscopy Time:  minutes 6 seconds (4 mGy). COMPLICATIONS: None immediate. PROCEDURE: The patient was advised of the possible risks andcomplications and agreed to undergo the procedure. The patient was then brought to the angiographic suite for the procedure. The right neck was prepped with chlorhexidine, draped in the usual sterile fashion using maximum barrier technique (cap and mask, sterile gown, sterile gloves, large sterile sheet, hand hygiene and cutaneous antiseptic). Local anesthesia was attained by infiltration with 1% lidocaine. Ultrasound demonstrated patency of the right jugular vein, and this was documented with an image. Under real-time ultrasound guidance, this vein was accessed with a 21 gauge micropuncture needle and image documentation was performed. The needle was exchanged over a guidewire for a peel-away sheath through which a 22 cm 5 [place] single lumen power injectable PICC was advanced, and positioned with its tip at the lower SVC/right atrial junction. The cuff was positioned in the subcutaneous  tract. Fluoroscopy during the procedure and fluoro spot radiograph confirms appropriate catheter position. The catheter was flushed, secured to the skin with Prolene sutures, and covered with a sterile dressing. FINDINGS: Right jugular PICC tip is at the cavoatrial junction. IMPRESSION: Successful placement of a tunneled right jugular PICC with sonographic and fluoroscopic guidance. The catheter is ready for use. Electronically Signed   By: Marybelle Killings M.D.   On: 03/15/2016 17:14    Micro Results      Recent Results (from the past 240 hour(s))  Culture, blood (Routine x 2)     Status: Abnormal   Collection Time: 03/10/16 11:42 PM  Result Value Ref Range Status   Specimen Description BLOOD BLOOD LEFT FOREARM  Final   Special Requests BOTTLES DRAWN AEROBIC AND ANAEROBIC 5 CC  Final   Culture  Setup Time   Final    GRAM POSITIVE COCCI IN CHAINS ANAEROBIC BOTTLE ONLY CRITICAL RESULT CALLED TO, READ BACK BY AND VERIFIED WITH: T. Pickering Pharm.D. 20:05 03/11/16 ([email or web address])    Culture (A)  Final    VIRIDANS STREPTOCOCCUS THE SIGNIFICANCE OF ISOLATING THIS ORGANISM FROM A SINGLE SET OF BLOOD CULTURES WHEN MULTIPLE SETS ARE DRAWN IS UNCERTAIN. PLEASE NOTIFY THE MICROBIOLOGY DEPARTMENT WITHIN ONE WEEK IF SPECIATION AND SENSITIVITIES ARE REQUIRED. Performed at [facility]    Report Status 03/13/2016 FINAL  Final  Blood Culture ID Panel (Reflexed)     Status: Abnormal   Collection Time: 03/10/16 11:42 PM  Result Value Ref Range Status   Enterococcus species NOT DETECTED NOT DETECTED Final   Vancomycin resistance NOT DETECTED NOT DETECTED Final   Listeria monocytogenes NOT DETECTED NOT DETECTED Final   Staphylococcus species NOT DETECTED NOT DETECTED Final   Staphylococcus  aureus NOT DETECTED NOT DETECTED Final   Methicillin resistance NOT DETECTED NOT DETECTED Final   Streptococcus species DETECTED (A) NOT DETECTED Final    Comment: CRITICAL RESULT CALLED TO, READ BACK BY AND VERIFIED  WITH: T. Pickering Pharm.D. 20:05 03/11/16 ([email or web address])    Streptococcus agalactiae NOT DETECTED NOT DETECTED Final   Streptococcus pneumoniae NOT DETECTED NOT DETECTED Final   Streptococcus pyogenes NOT DETECTED NOT DETECTED Final   Acinetobacter baumannii NOT DETECTED NOT DETECTED Final   Enterobacteriaceae species NOT DETECTED NOT DETECTED Final   Enterobacter cloacae complex NOT DETECTED NOT DETECTED Final   Escherichia coli NOT DETECTED NOT DETECTED Final   Klebsiella oxytoca NOT DETECTED NOT DETECTED Final   Klebsiella pneumoniae NOT DETECTED NOT DETECTED Final   Proteus species NOT DETECTED NOT DETECTED Final   Serratia marcescens NOT DETECTED NOT DETECTED Final   Carbapenem resistance NOT DETECTED NOT DETECTED Final   Haemophilus influenzae NOT DETECTED NOT DETECTED Final   Neisseria meningitidis NOT DETECTED NOT DETECTED Final   Pseudomonas aeruginosa NOT DETECTED NOT DETECTED Final   Candida albicans NOT DETECTED NOT DETECTED Final   Candida glabrata NOT DETECTED NOT DETECTED Final   Candida krusei NOT DETECTED NOT DETECTED Final   Candida parapsilosis NOT DETECTED NOT DETECTED Final   Candida tropicalis NOT DETECTED NOT DETECTED Final  Culture, blood (Routine x 2)     Status: None (Preliminary result)   Collection Time: 03/11/16 12:33 AM  Result Value Ref Range Status   Specimen Description BLOOD RIGHT HAND  Final   Special Requests IN PEDIATRIC BOTTLE 1CC  Final   Culture   Final    NO GROWTH 4 DAYS Performed at [facility]    Report Status PENDING  Incomplete  Urine culture     Status: None   Collection Time: 03/11/16  1:46 AM  Result Value Ref Range Status   Specimen Description URINE, CLEAN CATCH  Final   Special Requests NONE  Final   Culture NO GROWTH Performed at [facility]   Final   Report Status 03/12/2016 FINAL  Final  Culture, blood (routine x 2)     Status: None (Preliminary result)   Collection Time: 03/13/16 10:25 AM  Result Value Ref  Range Status   Specimen Description BLOOD LEFT ARM  10 ML IN [facility] BOTTLE  Final   Special Requests NONE  Final   Culture   Final    NO GROWTH 2 DAYS Performed at [facility]    Report Status PENDING  Incomplete  Culture, blood (routine x 2)     Status: None (Preliminary result)   Collection Time: 03/13/16 10:30 AM  Result Value Ref Range Status   Specimen Description BLOOD LEFT ARM  10 ML IN [facility] BOTTLE  Final   Special Requests NONE  Final   Culture   Final    NO GROWTH 2 DAYS Performed at [facility]    Report Status PENDING  Incomplete    Today   Subjective    Steven Church today has no headache,no chest abdominal pain,no new weakness tingling or numbness, feels much better wants to go home today.     Objective   Blood pressure 161/88, pulse 78, temperature 98.8 F (37.1 C), temperature source Oral, resp. rate 16, height '5\' 11"'  (1.803 m), weight 93.486 kg (206 lb 1.6 oz), SpO2 95 %.   Intake/Output Summary (Last 24 hours) at 03/16/16 0950 Last data filed at 03/16/16 0556  Gross per 24  hour  Intake   1444 ml  Output   2730 ml  Net  -1286 ml    Exam Awake Alert, Oriented x 2, No new F.N deficits, Normal affect, Speech is much improved question mild baseline dysarthria [place].AT,PERRAL Supple Neck,No JVD, No cervical lymphadenopathy appriciated.  Symmetrical Chest wall movement, Good air movement bilaterally, CTAB RRR,No Gallops,Rubs or new Murmurs, No Parasternal Heave +ve B.Sounds, Abd Soft, Non tender, No organomegaly appriciated, No rebound -guarding or rigidity. No Cyanosis, Clubbing or edema, No new Rash or bruise   Data Review   CBC w Diff:  Lab Results  Component Value Date   WBC 8.8 03/13/2016   WBC 5.6 03/04/2016   HGB 9.8* 03/13/2016   HGB 9.4* 03/04/2016   HCT 28.6* 03/13/2016   HCT 28.5* 03/04/2016   PLT 352 03/13/2016   PLT 224 03/04/2016   LYMPHOPCT 21 03/11/2016   LYMPHOPCT 28.2 03/04/2016   MONOPCT 11 03/11/2016    MONOPCT 10.7 03/04/2016   EOSPCT 0 03/11/2016   EOSPCT 1.3 03/04/2016   BASOPCT 0 03/11/2016   BASOPCT 0.3 03/04/2016    CMP:  Lab Results  Component Value Date   NA 142 03/16/2016   NA 144 03/04/2016   K 3.7 03/16/2016   K 2.6* 03/04/2016   CL 110 03/16/2016   CO2 23 03/16/2016   CO2 23 03/04/2016   BUN 36* 03/16/2016   BUN 8.7 03/04/2016   CREATININE 2.40* 03/16/2016   CREATININE 1.0 03/04/2016   CREATININE 1.02 01/21/2016   PROT 6.1* 03/16/2016   PROT 6.1* 03/04/2016   ALBUMIN 2.9* 03/16/2016   ALBUMIN 2.7* 03/04/2016   BILITOT 0.5 03/16/2016   BILITOT 0.51 03/04/2016   ALKPHOS 1487* 03/16/2016   ALKPHOS 2,137* 03/04/2016   AST 19 03/16/2016   AST 35* 03/04/2016   ALT 18 03/16/2016   ALT 26 03/04/2016  .   Total Time in preparing paper work, data evaluation and todays exam - 35 minutes  Thurnell Lose M.D on 03/16/2016 at 9:50 AM  [facility]   Office  [phone]

## 2016-03-16 NOTE — Discharge Instructions (Signed)
Follow with Primary MD [name], PA-C in 7 days   Get CBC, CMP, 2 view Chest X ray checked  by Primary MD next visit.    Activity: As tolerated with Full fall precautions use walker/cane & assistance as needed   Disposition Home    Diet:   Heart Healthy with feeding assistance and aspiration precautions.  For Heart failure patients - Check your Weight same time everyday, if you gain over 2 pounds, or you develop in leg swelling, experience more shortness of breath or chest pain, call your Primary MD immediately. Follow Cardiac Low Salt Diet and 1.5 lit/day fluid restriction.   On your next visit with your primary care physician please Get Medicines reviewed and adjusted.   Please request your Prim.MD to go over all Hospital Tests and Procedure/Radiological results at the follow up, please get all Hospital records sent to your Prim MD by signing hospital release before you go home.   If you experience worsening of your admission symptoms, develop shortness of breath, life threatening emergency, suicidal or homicidal thoughts you must seek medical attention immediately by calling 911 or calling your MD immediately  if symptoms less severe.  You Must read complete instructions/literature along with all the possible adverse reactions/side effects for all the Medicines you take and that have been prescribed to you. Take any new Medicines after you have completely understood and accpet all the possible adverse reactions/side effects.   Do not drive, operate heavy machinery, perform activities at heights, swimming or participation in water activities or provide baby sitting services if your were admitted for syncope or siezures until you have seen by Primary MD or a Neurologist and advised to do so again.  Do not drive when taking Pain medications.    Do not take more than prescribed Pain, Sleep and Anxiety Medications  Special Instructions: If you have smoked or chewed Tobacco   in the last 2 yrs please stop smoking, stop any regular Alcohol  and or any Recreational drug use.  Wear Seat belts while driving.   Please note  You were cared for by a hospitalist during your hospital stay. If you have any questions about your discharge medications or the care you received while you were in the hospital after you are discharged, you can call the unit and asked to speak with the hospitalist on call if the hospitalist that took care of you is not available. Once you are discharged, your primary care physician will handle any further medical issues. Please note that NO REFILLS for any discharge medications will be authorized once you are discharged, as it is imperative that you return to your primary care physician (or establish a relationship with a primary care physician if you do not have one) for your aftercare needs so that they can reassess your need for medications and monitor your lab values.

## 2016-03-17 ENCOUNTER — Ambulatory Visit: Payer: Managed Care, Other (non HMO)

## 2016-03-17 ENCOUNTER — Telehealth: Payer: Self-pay | Admitting: Medical

## 2016-03-17 NOTE — Telephone Encounter (Signed)
Called pt for TOC. He stated that he was feeling better but seemed very confused. He asked me to call his sister and speak to her because she is taking care of him now. I called his sister, [name], and she stated that he is feeling better but said he was definitely more confused. She stated that pt is now having home health services and will have first meeting with them later this morning. She states that she organizes all his medications and put them in a pill box for him. She was unsure of exactly what he was on. She was able to make an appt for him for next week. She stated she would need to check with his son because he would be coming with him. She was told that if she needed anything to call me at the office and phone number was provided. She was also told to bring all medications with him.

## 2016-03-17 NOTE — Addendum Note (Signed)
Encounter addended by: Jeryl Umholtz M Coye Dawood, RN on: 03/17/2016 11:11 AM<BR>     Documentation filed: Charges VN

## 2016-03-18 LAB — CULTURE, BLOOD (ROUTINE X 2)
CULTURE: NO GROWTH
Culture: NO GROWTH

## 2016-03-21 ENCOUNTER — Encounter: Payer: Self-pay | Admitting: Medical

## 2016-03-21 ENCOUNTER — Ambulatory Visit (INDEPENDENT_AMBULATORY_CARE_PROVIDER_SITE_OTHER): Payer: Managed Care, Other (non HMO) | Admitting: Medical

## 2016-03-21 ENCOUNTER — Telehealth: Payer: Self-pay

## 2016-03-21 VITALS — BP 120/78 | HR 93 | Wt 198.0 lb

## 2016-03-21 DIAGNOSIS — J189 Pneumonia, unspecified organism: Secondary | ICD-10-CM

## 2016-03-21 DIAGNOSIS — C7951 Secondary malignant neoplasm of bone: Secondary | ICD-10-CM

## 2016-03-21 DIAGNOSIS — E876 Hypokalemia: Secondary | ICD-10-CM | POA: Diagnosis not present

## 2016-03-21 DIAGNOSIS — R4701 Aphasia: Secondary | ICD-10-CM | POA: Diagnosis not present

## 2016-03-21 DIAGNOSIS — C61 Malignant neoplasm of prostate: Secondary | ICD-10-CM

## 2016-03-21 DIAGNOSIS — D649 Anemia, unspecified: Secondary | ICD-10-CM

## 2016-03-21 DIAGNOSIS — C7931 Secondary malignant neoplasm of brain: Secondary | ICD-10-CM

## 2016-03-21 NOTE — Telephone Encounter (Signed)
LM for [name]

## 2016-03-21 NOTE — Telephone Encounter (Signed)
Can we change to a CMET instead of just BUN/Cr.   Can we add Phosphorous as well.

## 2016-03-21 NOTE — Telephone Encounter (Signed)
[name] with [facility] called to let us know she is turning in blood work for CBC with diff and BUN/creat. She is asking if we need any labs added on. She states that if not, she can draw from his central line on Thursday when she goes back to pt home. [name] # [phone] Kitchen Steven Church

## 2016-03-22 ENCOUNTER — Telehealth: Payer: Self-pay | Admitting: Medical

## 2016-03-22 ENCOUNTER — Inpatient Hospital Stay: Payer: Managed Care, Other (non HMO) | Admitting: Medical

## 2016-03-22 ENCOUNTER — Ambulatory Visit: Payer: Self-pay | Admitting: Radiation Oncology

## 2016-03-22 ENCOUNTER — Encounter: Payer: Self-pay | Admitting: Medical

## 2016-03-22 NOTE — Progress Notes (Signed)
Subjective: Chief Complaint  Patient presents with  . Follow-up    said "so far so good"    Here today with his son for hospital f/u.   Was admitted to hospital 03/10/16 - 03/16/16 for aphasia, hypokalemia, hyomagnesemia, CAP, and dysarthria.  Today he feels overall pretty good and no current c/o dysarthria or aphasia.  He has 5 more days left of Rocephin by picc line which his son was taught how to clean this and administer the medication at home.   He seems to be doing ok on this.  He completed radiation recently for brain mets.   Lost his hair, but in good spirits and doing ok otherwise.   He has no new c/o.  Past Medical History  Diagnosis Date  . Hypertension   . Obesity   . Prostate cancer ([place]) 04/07/2015    bone mets, started oncology 03/2015  . Family history of cancer   . Former smoker   . Bone cancer ([place])   . Brain cancer ([place])    ROS as in subjective   Objective: BP 120/78 mmHg  Pulse 93  Wt 198 lb (89.812 kg)  Gen: wd, wn, nad Lungs: mostly clear, few lower right field crackles Heart RRR, normal s1, s2, no murmurs Ext: no edema Scalp - with most hair gone Psych; pleasant, good eye contact, answers questions appropriatley Neuro: cn2-12 intact, sensation seems WNL, A&O x 3, no focal deficit noted today    Assessment: Encounter Diagnoses  Name Primary?  . CAP (community acquired pneumonia) Yes  . Aphasia   . Brain metastasis ([place])   . Chronic anemia   . Hypomagnesemia   . Hypokalemia   . 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  He will f/u with the hospital for PICC line removal  Labs today given lab abnormalities in the hospital.  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     Aphasia and dysarthria  improved  Discussed his recent month or 2 history and treatment.  Glad to see he is good spirits, remains positive.  He has good thinks to say about Dr. [name] and he notes Dr. [name] always makes him feel empowered and good about things, as well as he is grateful for the wonderful staff at the cancer center.  Glad to hear this as well.

## 2016-03-22 NOTE — Telephone Encounter (Signed)
pls call hospital or try to verify who is going to remove the picc line.  I think the hospital interventional radiology may but not sure.  Labs from 03/21/16 stable. Go for chest xray thursday as we discussed.

## 2016-03-23 ENCOUNTER — Ambulatory Visit: Payer: Managed Care, Other (non HMO)

## 2016-03-23 ENCOUNTER — Ambulatory Visit (HOSPITAL_BASED_OUTPATIENT_CLINIC_OR_DEPARTMENT_OTHER): Payer: Managed Care, Other (non HMO) | Admitting: Oncology

## 2016-03-23 ENCOUNTER — Other Ambulatory Visit (HOSPITAL_BASED_OUTPATIENT_CLINIC_OR_DEPARTMENT_OTHER): Payer: Managed Care, Other (non HMO)

## 2016-03-23 ENCOUNTER — Telehealth: Payer: Self-pay | Admitting: Oncology

## 2016-03-23 ENCOUNTER — Other Ambulatory Visit: Payer: Self-pay | Admitting: *Deleted

## 2016-03-23 VITALS — BP 130/71 | HR 83 | Temp 97.9°F | Resp 17 | Ht 71.0 in | Wt 211.0 lb

## 2016-03-23 DIAGNOSIS — C61 Malignant neoplasm of prostate: Secondary | ICD-10-CM

## 2016-03-23 DIAGNOSIS — C7931 Secondary malignant neoplasm of brain: Secondary | ICD-10-CM | POA: Diagnosis not present

## 2016-03-23 DIAGNOSIS — J189 Pneumonia, unspecified organism: Secondary | ICD-10-CM

## 2016-03-23 DIAGNOSIS — C7951 Secondary malignant neoplasm of bone: Secondary | ICD-10-CM | POA: Diagnosis not present

## 2016-03-23 DIAGNOSIS — Z79899 Other long term (current) drug therapy: Secondary | ICD-10-CM

## 2016-03-23 LAB — CBC WITH DIFFERENTIAL/PLATELET
BASO%: 0.2 % (ref 0.0–2.0)
Basophils Absolute: 0 10*3/uL (ref 0.0–0.1)
EOS%: 0.5 % (ref 0.0–7.0)
Eosinophils Absolute: 0.1 10*3/uL (ref 0.0–0.5)
HCT: 28.9 % — ABNORMAL LOW (ref 38.4–49.9)
HEMOGLOBIN: 9.6 g/dL — AB (ref 13.0–17.1)
LYMPH%: 10.8 % — ABNORMAL LOW (ref 14.0–49.0)
MCH: 27.6 pg (ref 27.2–33.4)
MCHC: 33.2 g/dL (ref 32.0–36.0)
MCV: 83 fL (ref 79.3–98.0)
MONO#: 0.4 10*3/uL (ref 0.1–0.9)
MONO%: 3.2 % (ref 0.0–14.0)
NEUT%: 85.3 % — ABNORMAL HIGH (ref 39.0–75.0)
NEUTROS ABS: 10 10*3/uL — AB (ref 1.5–6.5)
Platelets: 207 10*3/uL (ref 140–400)
RBC: 3.48 10*6/uL — ABNORMAL LOW (ref 4.20–5.82)
RDW: 19.2 % — AB (ref 11.0–14.6)
WBC: 11.7 10*3/uL — ABNORMAL HIGH (ref 4.0–10.3)
lymph#: 1.3 10*3/uL (ref 0.9–3.3)

## 2016-03-23 LAB — COMPREHENSIVE METABOLIC PANEL
ALBUMIN: 3 g/dL — AB (ref 3.5–5.0)
ALT: 13 U/L (ref 0–55)
AST: 12 U/L (ref 5–34)
Anion Gap: 11 mEq/L (ref 3–11)
BUN: 21.2 mg/dL (ref 7.0–26.0)
CO2: 19 mEq/L — ABNORMAL LOW (ref 22–29)
Calcium: 7.2 mg/dL — ABNORMAL LOW (ref 8.4–10.4)
Chloride: 111 mEq/L — ABNORMAL HIGH (ref 98–109)
Creatinine: 1.6 mg/dL — ABNORMAL HIGH (ref 0.7–1.3)
EGFR: 50 mL/min/{1.73_m2} — ABNORMAL LOW (ref >90)
GLUCOSE: 152 mg/dL — AB (ref 70–140)
Potassium: 4.1 mEq/L (ref 3.5–5.1)
SODIUM: 140 meq/L (ref 136–145)
TOTAL PROTEIN: 6.5 g/dL (ref 6.4–8.3)

## 2016-03-23 NOTE — Progress Notes (Signed)
Addendum/Correction: Steven Church received a Lupron 30 mg on 03/04/2016. This will be repeated every 4 months. He also received Xgeva on that date and will be repeated in 4 weeks on 04/29/2016.

## 2016-03-23 NOTE — Progress Notes (Signed)
Hematology and Oncology Follow Up Visit  Steven Church [id number] 06-20-1951 65 y.o. 03/23/2016 3:40 PM   Principle Diagnosis: 65 year old gentleman with prostate cancer presented with diffuse bony metastasis and lymphadenopathy. His PSA is 899 with unknown Gleason score. His diagnosis was confirmed in June 2016. 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   Prior Therapy:  He is status post a biopsy obtained of the soft tissue in the retroperitoneal area on 04/03/2015. He is status post whole brain radiation for a total of 30 gray in 10 fractions completed in April 2017.  Current therapy:  Lupron 22.5 mg IM every 3 months started in June 2016.  Xgeva to start on 07/24/2015.  Zytiga 1000 mg started in April 2017.  Interim History:  Steven Church presents today for a follow-up visit. Since the last visit, he was hospitalized for community-acquired pneumonia and altered mental status. He presented with mental status changes and dysarthria. MRA obtained in May 2017 showed improved metastasis although he had some edema left. He was placed on IV dexamethasone and was switched to oral formulation and was discharged on 03/16/2016. Since his discharge, he has been doing very well and have been receiving intravenous antibiotics through a PICC line. His last dose of antibiotics to be on 03/26/2016.  He continued Zytiga without any specific complications such as weight loss, nausea or dyspepsia. His lower extremity edema has improved at this time.   He does not report any fevers, chills, sweats, weight loss or appetite changes. He does not report any chest pain, palpitation, orthopnea or leg edema. He does not report any cough, shortness of breath, dyspnea on exertion, wheezing or hemoptysis. He does not report any nausea, vomiting, abdominal pain, hematochezia, or early satiety. He does not report any hematuria or dysuria. He does report  frequency, hesitancy and nocturia. He does not report any skeletal complaints of arthralgias or myalgias. Remaining review of systems unremarkable.  Medications: I have reviewed the patient's current medications.  Current Outpatient Prescriptions  Medication Sig Dispense Refill  . amLODipine (NORVASC) 10 MG tablet Take 1 tablet (10 mg total) by mouth daily. 30 tablet 0  . Aspirin-Caffeine (BC FAST PAIN RELIEF PO) Take 1 Package by mouth as needed (pain). Reported on 03/21/2016    . calcitRIOL (ROCALTROL) 0.5 MCG capsule Take 1 capsule (0.5 mcg total) by mouth 2 (two) times daily. 60 capsule 0  . calcium carbonate (TUMS - DOSED IN MG ELEMENTAL CALCIUM) 500 MG chewable tablet Chew 5 tablets (1,000 mg of elemental calcium total) by mouth 3 (three) times daily. 90 tablet 0  . calcium-vitamin D 250-100 MG-UNIT per tablet Take 1 tablet by mouth 2 (two) times daily. 60 tablet 3  . cefTRIAXone (ROCEPHIN) 40 MG/ML IVPB Inject 50 mLs (2 g total) into the vein daily. Stop date 03/26/2016 50 mL 10  . dexamethasone (DECADRON) 4 MG tablet Take 1 tablet (4 mg total) by mouth daily. 30 tablet 0  . finasteride (PROSCAR) 5 MG tablet Take 1 tablet (5 mg total) by mouth daily. 90 tablet 0  . omeprazole (PRILOSEC) 40 MG capsule TAKE 1 CAPSULE (40 MG TOTAL) BY MOUTH DAILY. 90 capsule 1  . tamsulosin (FLOMAX) 0.4 MG CAPS capsule TAKE ONE CAPSULE BY MOUTH EVERY DAY (Patient taking differently: TAKE 0.4 MG BY MOUTH EVERY DAY) 30 capsule 1  . Vitamin D, Ergocalciferol, (DRISDOL) 50000 units CAPS capsule Take 1 capsule (50,000 Units total) by mouth every other day.  30 capsule 0  . ZYTIGA 250 MG tablet TAKE FOUR TABLETS BY MOUTH ONE TIME DAILY ON AN EMPTY STOMACH, AT LEAST 1 HOUR BEFORE AND 2 HOURS AFTER FOOD. 120 tablet 0  . KLOR-CON M20 20 MEQ tablet Take 20 mEq by mouth 2 (two) times daily.  0  . losartan (COZAAR) 100 MG tablet Take 100 mg by mouth daily.  3   No current facility-administered medications for this visit.      Allergies:  Allergies  Allergen Reactions  . Losartan Potassium-Hctz Other (See Comments)    dizziness    Past Medical History, Surgical history, Social history, and Family History were reviewed and updated.   Physical Exam: Blood pressure 130/71, pulse 83, temperature 97.9 F (36.6 C), temperature source Oral, resp. rate 17, height 5\' 11"  (1.803 m), weight 211 lb (95.709 kg), SpO2 100 %. ECOG: 1 General appearance: Pleasant-appearing gentleman appeared without distress. Normal speech noted. Head: Normocephalic, without obvious abnormality no oral ulcers or lesions. Neck: no adenopathy Lymph nodes: Cervical, supraclavicular, and axillary nodes normal. Heart:regular rate and rhythm, S1, S2 normal, no murmur, click, rub or gallop Lung:chest clear, no wheezing, rales, normal symmetric air entry Abdomin: soft, non-tender, without masses or organomegaly no shifting dullness or ascites. EXT:no erythema, induration, or nodules Neurological examination: No deficits noted.  Lab Results: Lab Results  Component Value Date   WBC 11.7* 03/23/2016   HGB 9.6* 03/23/2016   HCT 28.9* 03/23/2016   MCV 83.0 03/23/2016   PLT 207 03/23/2016     Chemistry      Component Value Date/Time   NA 142 03/16/2016 0454   NA 144 03/04/2016 1125   K 3.7 03/16/2016 0454   K 2.6* 03/04/2016 1125   CL 110 03/16/2016 0454   CO2 23 03/16/2016 0454   CO2 23 03/04/2016 1125   BUN 36* 03/16/2016 0454   BUN 8.7 03/04/2016 1125   CREATININE 2.40* 03/16/2016 0454   CREATININE 1.0 03/04/2016 1125   CREATININE 1.02 01/21/2016 0001      Component Value Date/Time   CALCIUM 7.3* 03/16/2016 0454   CALCIUM 9.2 03/04/2016 1125   ALKPHOS 1487* 03/16/2016 0454   ALKPHOS 2,137* 03/04/2016 1125   AST 19 03/16/2016 0454   AST 35* 03/04/2016 1125   ALT 18 03/16/2016 0454   ALT 26 03/04/2016 1125   BILITOT 0.5 03/16/2016 0454   BILITOT 0.51 03/04/2016 1125      Results for TRINIDY, LORIG SR. (MRN  [id number]) as of 03/23/2016 15:22  Ref. Range 01/22/2016 11:15 02/19/2016 12:31 03/04/2016 11:25  PSA Latest Ref Range: 0.0-4.0 ng/mL 702.5 (H) 152.7 (H) 144.1 (H)     Impression and Plan:  65 year old gentleman with the following issues:  1. Advanced prostate cancer presenting with diffuse bony metastasis and bulky lymphadenopathy and PSA of 899.8. This is biopsy proven to be prostate cancer.   He is currently on Lupron started in June 2016 but have developed castration resistant disease with a PSA rise up to 702.5 As well as brain metastasis.  He is currently on Zytiga and have tolerated it well. His PSA had an excellent response with a PSA dropped from 702 to 144. The plan is to continue with the same dose and schedule without any dose reduction or delay.   2. Bone directed therapy: He received Xgeva without any major complications. He will receive this with every visit on a monthly basis.  3. Dural/epidural mass noted over the frontal lobe with moderate regional mass effect  noted on an MRI on 01/22/2016. He is status post whole brain radiation and currently on dexamethasone. I recommended continuing dexamethasone at 4 mg daily with very slow taper.  4. Pain: Seems to be reasonably controlled with mild analgesia. No changes noted.  5. Prognosis: His prognosis is rather poor given the aggressive nature of his cancer and side of metastasis. He has recovered fairly well from his recent hospitalization and would like to continue aggressive therapy for the time being. Having is reasonable to continue with palliative oral anticancer therapy at this time.  6. Androgen deprivation: He will continue on Lupron 22.5 mg every 3 months and he will receive his next injection on 03/23/2016 and repeated in 3 months.  7. Community-acquired pneumonia: He is currently receiving intravenous antibiotics via PICC line. This was placed by interventional radiology and will be removed once antibiotic is  completed.  8. Follow-up: Will be in 4 weeks to evaluate his progress as well as his response to Zytiga.     Zola Button, MD 6/7/20173:40 PM

## 2016-03-23 NOTE — Telephone Encounter (Signed)
States that his [facility] nurse told him to go to the hospital, he will know for sure when he goes to cancer center today. Will go for xray tomorrow.

## 2016-03-23 NOTE — Telephone Encounter (Signed)
Gave pt apt & avs, OV Added per pof

## 2016-03-23 NOTE — Progress Notes (Signed)
Pt is not due for injections at this time per pharmacy

## 2016-03-24 ENCOUNTER — Ambulatory Visit
Admission: RE | Admit: 2016-03-24 | Discharge: 2016-03-24 | Disposition: A | Discharge status: Home / Self Care | Payer: Managed Care, Other (non HMO) | Source: Ambulatory Visit | Attending: Medical | Admitting: Medical

## 2016-03-24 DIAGNOSIS — J189 Pneumonia, unspecified organism: Secondary | ICD-10-CM

## 2016-03-24 LAB — PSA: PROSTATE SPECIFIC AG, SERUM: 104.9 ng/mL — AB (ref 0.0–4.0)

## 2016-03-25 ENCOUNTER — Encounter: Payer: Self-pay | Admitting: Medical

## 2016-03-28 ENCOUNTER — Ambulatory Visit: Payer: Managed Care, Other (non HMO) | Attending: Radiation Oncology

## 2016-03-29 ENCOUNTER — Ambulatory Visit
Admission: RE | Admit: 2016-03-29 | Discharge: 2016-03-29 | Disposition: A | Discharge status: Home / Self Care | Payer: Managed Care, Other (non HMO) | Source: Ambulatory Visit | Attending: Radiation Oncology | Admitting: Radiation Oncology

## 2016-03-29 ENCOUNTER — Encounter: Payer: Self-pay | Admitting: Radiation Oncology

## 2016-03-29 VITALS — BP 118/68 | HR 90 | Temp 97.7°F | Ht 71.0 in | Wt 216.9 lb

## 2016-03-29 DIAGNOSIS — Z7982 Long term (current) use of aspirin: Secondary | ICD-10-CM | POA: Diagnosis not present

## 2016-03-29 DIAGNOSIS — Z87891 Personal history of nicotine dependence: Secondary | ICD-10-CM | POA: Insufficient documentation

## 2016-03-29 DIAGNOSIS — Z923 Personal history of irradiation: Secondary | ICD-10-CM | POA: Diagnosis not present

## 2016-03-29 DIAGNOSIS — Z8546 Personal history of malignant neoplasm of prostate: Secondary | ICD-10-CM | POA: Insufficient documentation

## 2016-03-29 DIAGNOSIS — Z888 Allergy status to other drugs, medicaments and biological substances status: Secondary | ICD-10-CM | POA: Diagnosis not present

## 2016-03-29 DIAGNOSIS — J189 Pneumonia, unspecified organism: Secondary | ICD-10-CM | POA: Diagnosis not present

## 2016-03-29 DIAGNOSIS — C7951 Secondary malignant neoplasm of bone: Secondary | ICD-10-CM | POA: Insufficient documentation

## 2016-03-29 DIAGNOSIS — Z8249 Family history of ischemic heart disease and other diseases of the circulatory system: Secondary | ICD-10-CM | POA: Diagnosis not present

## 2016-03-29 DIAGNOSIS — C61 Malignant neoplasm of prostate: Secondary | ICD-10-CM | POA: Diagnosis not present

## 2016-03-29 DIAGNOSIS — C7931 Secondary malignant neoplasm of brain: Secondary | ICD-10-CM | POA: Insufficient documentation

## 2016-03-29 DIAGNOSIS — Z833 Family history of diabetes mellitus: Secondary | ICD-10-CM | POA: Diagnosis not present

## 2016-03-29 DIAGNOSIS — Z811 Family history of alcohol abuse and dependence: Secondary | ICD-10-CM | POA: Diagnosis not present

## 2016-03-29 DIAGNOSIS — E669 Obesity, unspecified: Secondary | ICD-10-CM | POA: Diagnosis not present

## 2016-03-29 DIAGNOSIS — I10 Essential (primary) hypertension: Secondary | ICD-10-CM | POA: Diagnosis not present

## 2016-03-29 DIAGNOSIS — Z801 Family history of malignant neoplasm of trachea, bronchus and lung: Secondary | ICD-10-CM | POA: Diagnosis not present

## 2016-03-29 NOTE — Progress Notes (Addendum)
Steven Church here for reassessment s/p XRT.  He denies any headaches, blurred nor double vision.  No changes in fine motor movement.  Recendt hospitalization for pnuemonia.  His son states at this time he was very confused at this time and was hospitalized for 6-7 days.  CT Brain attached from 03/11/16

## 2016-03-29 NOTE — Progress Notes (Signed)
Radiation Oncology         [id number]) [phone] ________________________________  Name: Steven Church Sr. 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  Since his last visit, he did undergo a Doppler ultrasound which was negative for thrombosis, and was admitted  the same day of his last visit on [facility] 25[facility] after he went home from his appointments and was unable to speak well. He did undergo imaging including CT scan and MRI which showed improvement of his disease with  pneumonia a decrease in the midline shift that had previously been noted. He was found however to have pneumonia and was discharged home with IJ catheter and IV antibiotics. He has since completed this on Saturday at this past week, and comes today for follow-up.                     On review of systems, the patient states he is doing well and is much more energetic and he has been previously reviewed coming to our office. He is able to speak clearly and his speech is on pressure. His family also confirm that they're very pleased with this progress as well. He has been postponing additional speech therapy out of concern for his recent illness, but is interested in resuming this if we feel that this would be of benefit in a few weeks. He does want to take a little bit more time to recover. He denies any shortness of breath or chest pain, blurred vision, double vision, and states that his lower extremity edema is also improved significantly. No other complaints or verbalized.  Past Medical History:  Past Medical History  Diagnosis Date  . Hypertension   . Obesity   . Prostate cancer ([facility]) 04/07/2015    bone mets, started oncology 03/2015  .  Family history of cancer   . Former smoker   . Bone cancer ([place])   . Brain cancer [facility])     Past Surgical History: Past Surgical History  Procedure Laterality Date  . Colonoscopy      never    Social History:  Social History   Social History  . Marital Status: Divorced    Spouse Name: N/A  . Number of Children: N/A  . Years of Education: N/A   Occupational History  . Not on file.   Social History Main Topics  . Smoking status: Former Smoker -- 1.00 packs/day for 18 years  . Smokeless tobacco: Never Used  . Alcohol Use: No  . Drug Use: No  . Sexual Activity: Yes   Other Topics Concern  . 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  Exercise with walking    Family History: Family History  Problem Relation Age of Onset  . Hypertension Mother   . Other Mother     died of old age  . Diabetes Father     died of diabetic infection complications  . Cancer Sister     lung  . Alcohol abuse Brother   . Obesity Sister   . Hypertension Brother  ALLERGIES:  is allergic to losartan potassium-hctz.  Meds: Current Outpatient Prescriptions  Medication Sig Dispense Refill  . amLODipine (NORVASC) 10 MG tablet Take 1 tablet (10 mg total) by mouth daily. 30 tablet 0  . Aspirin-Caffeine (BC FAST PAIN RELIEF PO) Take 1 Package by mouth as needed (pain). Reported on 03/21/2016    . calcitRIOL (ROCALTROL) 0.5 MCG capsule Take 1 capsule (0.5 mcg total) by mouth 2 (two) times daily. 60 capsule 0  . calcium carbonate (TUMS - DOSED IN MG ELEMENTAL CALCIUM) 500 MG chewable tablet Chew 5 tablets (1,000 mg of elemental calcium total) by mouth 3 (three) times daily. 90 tablet 0  . calcium-vitamin D 250-100 MG-UNIT per tablet Take 1 tablet by mouth 2 (two) times daily. 60 tablet 3  . cefTRIAXone (ROCEPHIN) 40 MG/ML IVPB Inject 50 mLs (2 g total) into the vein daily. Stop date 03/26/2016  50 mL 10  . dexamethasone (DECADRON) 4 MG tablet Take 1 tablet (4 mg total) by mouth daily. 30 tablet 0  . finasteride (PROSCAR) 5 MG tablet Take 1 tablet (5 mg total) by mouth daily. 90 tablet 0  . KLOR-CON M20 20 MEQ tablet Take 20 mEq by mouth 2 (two) times daily.  0  . losartan (COZAAR) 100 MG tablet Take 100 mg by mouth daily.  3  . omeprazole (PRILOSEC) 40 MG capsule TAKE 1 CAPSULE (40 MG TOTAL) BY MOUTH DAILY. 90 capsule 1  . tamsulosin (FLOMAX) 0.4 MG CAPS capsule TAKE ONE CAPSULE BY MOUTH EVERY DAY (Patient taking differently: TAKE 0.4 MG BY MOUTH EVERY DAY) 30 capsule 1  . Vitamin D, Ergocalciferol, (DRISDOL) 50000 units CAPS capsule Take 1 capsule (50,000 Units total) by mouth every other day. 30 capsule 0  . ZYTIGA 250 MG tablet TAKE FOUR TABLETS BY MOUTH ONE TIME DAILY ON AN EMPTY STOMACH, AT LEAST 1 HOUR BEFORE AND 2 HOURS AFTER FOOD. 120 tablet 0   No current facility-administered medications for this encounter.    Physical Findings:  height is 5\' 11"  (1.803 m) and weight is 216 lb 14.4 oz (98.385 kg). His temperature is 97.7 F (36.5 C). His blood pressure is 118/68 and his pulse is 90. His oxygen saturation is 99%.  In general this is a well appearing African-American male in no acute distress. He's alert and oriented x4 and appropriate throughout the examination. Cardiopulmonary assessment is negative for acute distress and he exhibits normal effort. He is normocephalic, atraumatic, EOMs are intact. His speech is clear, and non-pressured. He has an occasional stutter during conversation but is able to initiate and complete his thoughts.   Lab Findings: Lab Results  Component Value Date   WBC 11.7* 03/23/2016   HGB 9.6* 03/23/2016   HCT 28.9* 03/23/2016   MCV 83.0 03/23/2016   PLT 207 03/23/2016     Radiographic Findings: Dg Chest 2 View  03/24/2016  CLINICAL DATA:  Recent pneumonia, history of prostate carcinoma with bone and brain metastasis EXAM: CHEST  2 VIEW  COMPARISON:  Chest x-ray of 03/11/2016, and CT chest of 02/27/2015 FINDINGS: Mild bibasilar linear atelectasis or scarring is noted. There is a vague opacity in the left mid upper lung field which could represent a focus of pneumonia. This does not appear to be typical of a blastic bone lesion although there are multiple blastic bone lesions scattered throughout the ribs and proximal right humerus. There is slightly more prominent opacity within the right lung apex. This also could be due to a blastic bone  lesion, but a right apical lung lesion cannot be excluded on the current film. CT of the chest may be warranted to assess further. A right central venous line is present with the tip overlying the lower SVC near the expected RA junction. The heart is within upper limits normal and the descending thoracic aorta is ectatic. Blastic lesions are noted in the mid lower thoracic vertebra as well. IMPRESSION: 1. Vague opacity in the left mid upper lung may represent a focus of pneumonia. Recommend followup. 2. Multiple blastic bone metastasis throughout multiple ribs, right proximal humerus, and mid lower thoracic spine. 3. Increasing opacity in the right lung apex medially. This may be due to a blastic bone lesion as well but and apical lesion cannot be excluded. Consider CT the chest if warranted clinically. 4. Right central venous line tip near the expected SVC -RA junction. Electronically Signed   By: Ivar Drape M.D.   On: 03/24/2016 14:58   Dg Chest 2 View  03/11/2016  CLINICAL DATA:  Acute onset of confusion and lethargy. Current history of pancreatic cancer. Initial encounter. EXAM: CHEST  2 VIEW COMPARISON:  Chest radiograph performed 02/27/2015, and PET/CT performed 03/20/2015 FINDINGS: The lungs are well-aerated. Right peripheral and left midlung airspace opacities raise concern for pneumonia. Vascular congestion is noted. A small left pleural effusion is noted. No pneumothorax is seen. The heart is  borderline normal in size. No acute osseous abnormalities are seen. IMPRESSION: Right peripheral and left midlung airspace opacities raise concern for multifocal pneumonia. Vascular congestion and small left pleural effusion noted. Followup PA and lateral chest X-ray is recommended in 3-4 weeks following trial of antibiotic therapy to ensure resolution and exclude underlying malignancy. Electronically Signed   By: Garald Balding M.D.   On: 03/11/2016 00:50   Ct Head Wo Contrast  03/11/2016  CLINICAL DATA:  Acute onset of slurred speech. Known brain metastases from pancreatic cancer. Initial encounter. EXAM: CT HEAD WITHOUT CONTRAST TECHNIQUE: Contiguous axial images were obtained from the base of the skull through the vertex without intravenous contrast. COMPARISON:  MRI of the brain performed 01/22/2016 FINDINGS: There is no evidence of acute infarction, or intra- or extra-axial hemorrhage on CT. A large partially calcified dural/epidural soft tissue mass is again noted along the left cerebral convexity, with two lobulations each measuring up to 3.6 cm. Approximately 6 mm of rightward midline shift is seen; the degree of midline shift appears somewhat improved. Additional tumor anterior to the left temporal lobe is partially characterized. Additional known tumors are not well seen on CT. Mild subcortical white matter change likely reflects small vessel ischemic microangiopathy. The brainstem and fourth ventricle are within normal limits. The basal ganglia are unremarkable in appearance. There is no evidence of fracture; visualized osseous structures are unremarkable in appearance. The orbits are within normal limits. The paranasal sinuses and mastoid air cells are well-aerated. No significant soft tissue abnormalities are seen. IMPRESSION: 1. Large partially calcified dural/epidural soft tissue masses again noted. The largest, along the left cerebral convexity, has two lobulations, each measuring up to 3.6 cm. 6  mm of rightward midline shift noted, somewhat improved from the prior MRI. 2. Additional known tumors are not well seen on noncontrast CT. 3. Mild small vessel ischemic microangiopathy. Electronically Signed   By: Garald Balding M.D.   On: 03/11/2016 01:54   Mr Brain Wo Contrast  03/11/2016  CLINICAL DATA:  65 year old hypertensive male with history of prostate cancer presenting with confusion and acute onset of  slurred speech. Completion of brain radiation therapy April of 2017. Subsequent encounter. EXAM: MRI HEAD WITHOUT CONTRAST TECHNIQUE: Multiplanar, multiecho pulse sequences of the brain and surrounding structures were obtained without intravenous contrast. COMPARISON:  03/11/2016 CT.  01/22/2016 MR. FINDINGS: The present exam is motion degraded with only 5 sequences were able to be obtained prior to patient aborting examination. No acute infarct or obvious intracranial hemorrhage. Osseous metastatic disease. Prominent left hemisphere dural extension with mass effect upon left hemisphere has improved when compared to the 01/22/2016 examination. Maximal thickness of left hemispheric dural extension 1.9 cm versus prior 2.7 cm. There remains mass-effect upon the left lateral ventricle with midline shift to the 5.6 mm midline shift to the right versus prior 11.7 mm of midline shift the right. IMPRESSION: Limited exam as noted above without evidence of acute infarct. When compared to the 01/22/2016 examination, there has been interval improvement although incomplete resolution of significant left hemispheric dural extension of metastatic disease with continued mass effect upon the left lateral ventricle and midline shift to the right (currently 5.6 mm midline shift to the right versus prior 11.7 mm midline shift to the right). Electronically Signed   By: Genia Del M.D.   On: 03/11/2016 11:40   US Renal  03/12/2016  CLINICAL DATA:  65 year old male with history of acute renal failure. History of prostate  cancer. Hypertension. EXAM: RENAL / URINARY TRACT ULTRASOUND COMPLETE COMPARISON:  No priors. FINDINGS: Right Kidney: Length: 12.8 cm. Echogenicity within normal limits. No mass or hydronephrosis visualized. Left Kidney: Length: 12.8 cm. Echogenicity within normal limits. Small amount of perinephric fluid adjacent to the lower pole of the left kidney. No mass or hydronephrosis visualized. Bladder: Appears normal for degree of bladder distention. IMPRESSION: 1. Generally normal sonographic appearance of the kidneys and urinary bladder. There is a small amount of perinephric fluid adjacent to the lower pole of the left kidney, which is nonspecific. Electronically Signed   By: Vinnie Langton M.D.   On: 03/12/2016 09:21   Ir Fluoro Guide Cv Line Right  03/15/2016  INDICATION: Poor IV access.  Encephalopathy. EXAM: RIGHT JUGULAR TUNNELED PICC LINE PLACEMENT WITH ULTRASOUND AND FLUOROSCOPIC GUIDANCE MEDICATIONS: ; The antibiotic was administered within an appropriate time interval prior to skin puncture. ANESTHESIA/SEDATION: None FLUOROSCOPY TIME:  Fluoroscopy Time:  minutes 6 seconds (4 mGy). COMPLICATIONS: None immediate. PROCEDURE: The patient was advised of the possible risks andcomplications and agreed to undergo the procedure. The patient was then brought to the angiographic suite for the procedure. The right neck was prepped with chlorhexidine, draped in the usual sterile fashion using maximum barrier technique (cap and mask, sterile gown, sterile gloves, large sterile sheet, hand hygiene and cutaneous antiseptic). Local anesthesia was attained by infiltration with 1% lidocaine. Ultrasound demonstrated patency of the right jugular vein, and this was documented with an image. Under real-time ultrasound guidance, this vein was accessed with a 21 gauge micropuncture needle and image documentation was performed. The needle was exchanged over a guidewire for a peel-away sheath through which a 22 cm 5 [place] single  lumen power injectable PICC was advanced, and positioned with its tip at the lower SVC/right atrial junction. The cuff was positioned in the subcutaneous tract. Fluoroscopy during the procedure and fluoro spot radiograph confirms appropriate catheter position. The catheter was flushed, secured to the skin with Prolene sutures, and covered with a sterile dressing. FINDINGS: Right jugular PICC tip is at the cavoatrial junction. IMPRESSION: Successful placement of a tunneled right jugular PICC  with sonographic and fluoroscopic guidance. The catheter is ready for use. Electronically Signed   By: Marybelle Killings M.D.   On: 03/15/2016 17:14   Ir US Guide Vasc Access Right  03/15/2016  INDICATION: Poor IV access.  Encephalopathy. EXAM: RIGHT JUGULAR TUNNELED PICC LINE PLACEMENT WITH ULTRASOUND AND FLUOROSCOPIC GUIDANCE MEDICATIONS: ; The antibiotic was administered within an appropriate time interval prior to skin puncture. ANESTHESIA/SEDATION: None FLUOROSCOPY TIME:  Fluoroscopy Time:  minutes 6 seconds (4 mGy). COMPLICATIONS: None immediate. PROCEDURE: The patient was advised of the possible risks andcomplications and agreed to undergo the procedure. The patient was then brought to the angiographic suite for the procedure. The right neck was prepped with chlorhexidine, draped in the usual sterile fashion using maximum barrier technique (cap and mask, sterile gown, sterile gloves, large sterile sheet, hand hygiene and cutaneous antiseptic). Local anesthesia was attained by infiltration with 1% lidocaine. Ultrasound demonstrated patency of the right jugular vein, and this was documented with an image. Under real-time ultrasound guidance, this vein was accessed with a 21 gauge micropuncture needle and image documentation was performed. The needle was exchanged over a guidewire for a peel-away sheath through which a 22 cm 5 [place] single lumen power injectable PICC was advanced, and positioned with its tip at the lower  SVC/right atrial junction. The cuff was positioned in the subcutaneous tract. Fluoroscopy during the procedure and fluoro spot radiograph confirms appropriate catheter position. The catheter was flushed, secured to the skin with Prolene sutures, and covered with a sterile dressing. FINDINGS: Right jugular PICC tip is at the cavoatrial junction. IMPRESSION: Successful placement of a tunneled right jugular PICC with sonographic and fluoroscopic guidance. The catheter is ready for use. Electronically Signed   By: Marybelle Killings M.D.   On: 03/15/2016 17:14    Impression/Plan: 1. Metastatic Prostate Cancer to Brain. The patient is doing very well clinically as well as radiographically by CT imaging while he had been hospitalized. We will follow up with him by phone in one month, and determine if additional imaging will be indicated of the brain. The patient has been on 4mg  daily of dexamethasone and after discussing with Dr. Tammi Klippel, we would recommend tapering to 2mg  daily for 1 month. We will plan also to follow up by phone with him in one week to ensure he is doing well with this adjustment. Otherwise we will plan to discontinue this in one more month. If his symptoms begin to worsen with speech or headaches, etc., then we would go back to 69md of dexamethasone daily. He and his son state agreement and understanding of this plan.  2. Recent CAP. The patient has finished his antibiotics. He also went for a repeat CXR with IR, which showed improvement  On 03/24/16 and after discussing his case with Dr. [name], he felt that the patient's IJ catheter could be removed if he was clinically and radiographically doing better. We will set him up to meet with IR for this to be removed.     Carola Rhine, PAC

## 2016-03-30 ENCOUNTER — Other Ambulatory Visit: Payer: Self-pay | Admitting: Radiation Oncology

## 2016-03-30 ENCOUNTER — Ambulatory Visit (HOSPITAL_COMMUNITY)
Admission: RE | Admit: 2016-03-30 | Discharge: 2016-03-30 | Disposition: A | Discharge status: Home / Self Care | Payer: Managed Care, Other (non HMO) | Source: Ambulatory Visit | Attending: Radiation Oncology | Admitting: Radiation Oncology

## 2016-03-30 DIAGNOSIS — Z452 Encounter for adjustment and management of vascular access device: Secondary | ICD-10-CM | POA: Diagnosis present

## 2016-03-30 DIAGNOSIS — J189 Pneumonia, unspecified organism: Secondary | ICD-10-CM

## 2016-03-30 NOTE — Addendum Note (Signed)
Encounter addended by: Trevionne Advani M Paislea Hatton, RN on: 03/30/2016  6:53 PM<BR>     Documentation filed: Charges VN

## 2016-03-30 NOTE — Procedures (Signed)
Removal of RIJ PICC with no immediate complications No blood loss  Steven Church E 3:42 PM 03/30/2016

## 2016-03-30 NOTE — Discharge Instructions (Signed)

## 2016-04-08 ENCOUNTER — Ambulatory Visit (HOSPITAL_BASED_OUTPATIENT_CLINIC_OR_DEPARTMENT_OTHER): Payer: Managed Care, Other (non HMO)

## 2016-04-08 ENCOUNTER — Other Ambulatory Visit: Payer: Self-pay | Admitting: Medical

## 2016-04-08 ENCOUNTER — Other Ambulatory Visit (HOSPITAL_BASED_OUTPATIENT_CLINIC_OR_DEPARTMENT_OTHER): Payer: Managed Care, Other (non HMO)

## 2016-04-08 ENCOUNTER — Ambulatory Visit (HOSPITAL_BASED_OUTPATIENT_CLINIC_OR_DEPARTMENT_OTHER): Payer: Managed Care, Other (non HMO) | Admitting: Oncology

## 2016-04-08 VITALS — BP 136/75 | HR 85 | Temp 98.5°F | Resp 18 | Ht 71.0 in | Wt 217.7 lb

## 2016-04-08 DIAGNOSIS — E291 Testicular hypofunction: Secondary | ICD-10-CM

## 2016-04-08 DIAGNOSIS — C7951 Secondary malignant neoplasm of bone: Secondary | ICD-10-CM

## 2016-04-08 DIAGNOSIS — C7931 Secondary malignant neoplasm of brain: Secondary | ICD-10-CM

## 2016-04-08 DIAGNOSIS — C61 Malignant neoplasm of prostate: Secondary | ICD-10-CM | POA: Diagnosis not present

## 2016-04-08 LAB — CBC WITH DIFFERENTIAL/PLATELET
BASO%: 0.7 % (ref 0.0–2.0)
BASOS ABS: 0.1 10*3/uL (ref 0.0–0.1)
EOS%: 0.3 % (ref 0.0–7.0)
Eosinophils Absolute: 0 10*3/uL (ref 0.0–0.5)
HEMATOCRIT: 30.1 % — AB (ref 38.4–49.9)
HGB: 9.9 g/dL — ABNORMAL LOW (ref 13.0–17.1)
LYMPH#: 0.9 10*3/uL (ref 0.9–3.3)
LYMPH%: 12.8 % — ABNORMAL LOW (ref 14.0–49.0)
MCH: 28.4 pg (ref 27.2–33.4)
MCHC: 32.7 g/dL (ref 32.0–36.0)
MCV: 86.8 fL (ref 79.3–98.0)
MONO#: 0.3 10*3/uL (ref 0.1–0.9)
MONO%: 4.7 % (ref 0.0–14.0)
NEUT#: 6.1 10*3/uL (ref 1.5–6.5)
NEUT%: 81.5 % — AB (ref 39.0–75.0)
PLATELETS: 148 10*3/uL (ref 140–400)
RBC: 3.47 10*6/uL — ABNORMAL LOW (ref 4.20–5.82)
RDW: 22 % — ABNORMAL HIGH (ref 11.0–14.6)
WBC: 7.4 10*3/uL (ref 4.0–10.3)

## 2016-04-08 LAB — COMPREHENSIVE METABOLIC PANEL
ALT: 16 U/L (ref 0–55)
ANION GAP: 10 meq/L (ref 3–11)
AST: 13 U/L (ref 5–34)
Albumin: 3.2 g/dL — ABNORMAL LOW (ref 3.5–5.0)
BILIRUBIN TOTAL: 0.69 mg/dL (ref 0.20–1.20)
BUN: 15.8 mg/dL (ref 7.0–26.0)
CALCIUM: 8.1 mg/dL — AB (ref 8.4–10.4)
CHLORIDE: 111 meq/L — AB (ref 98–109)
CO2: 19 mEq/L — ABNORMAL LOW (ref 22–29)
CREATININE: 0.9 mg/dL (ref 0.7–1.3)
EGFR: >90 mL/min/{1.73_m2} (ref >90)
Glucose: 125 mg/dl (ref 70–140)
Potassium: 4 mEq/L (ref 3.5–5.1)
Sodium: 140 mEq/L (ref 136–145)
Total Protein: 6.7 g/dL (ref 6.4–8.3)

## 2016-04-08 MED ORDER — DENOSUMAB 120 MG/1.7ML ~~LOC~~ SOLN
120.0000 mg | Freq: Once | SUBCUTANEOUS | Status: AC
Start: 2016-04-08 — End: 2016-04-08
  Administered 2016-04-08: 120 mg via SUBCUTANEOUS
  Filled 2016-04-08: qty 1.7

## 2016-04-08 MED ORDER — LEUPROLIDE ACETATE (4 MONTH) 30 MG IM KIT: 30.0000 mg | PACK | Freq: Once | INTRAMUSCULAR | Status: DC

## 2016-04-08 NOTE — Patient Instructions (Signed)
Denosumab injection  What is this medicine?  DENOSUMAB (den oh sue mab) slows bone breakdown. Prolia is used to treat osteoporosis in women after menopause and in men. Xgeva is used to prevent bone fractures and other bone problems caused by cancer bone metastases. Xgeva is also used to treat giant cell tumor of the bone.  This medicine may be used for other purposes; ask your health care provider or pharmacist if you have questions.  What should I tell my health care provider before I take this medicine?  They need to know if you have any of these conditions:  -dental disease  -eczema  -infection or history of infections  -kidney disease or on dialysis  -low blood calcium or vitamin D  -malabsorption syndrome  -scheduled to have surgery or tooth extraction  -taking medicine that contains denosumab  -thyroid or parathyroid disease  -an unusual reaction to denosumab, other medicines, foods, dyes, or preservatives  -pregnant or trying to get pregnant  -breast-feeding  How should I use this medicine?  This medicine is for injection under the skin. It is given by a health care professional in a hospital or clinic setting.  If you are getting Prolia, a special MedGuide will be given to you by the pharmacist with each prescription and refill. Be sure to read this information carefully each time.  For Prolia, talk to your pediatrician regarding the use of this medicine in children. Special care may be needed. For Xgeva, talk to your pediatrician regarding the use of this medicine in children. While this drug may be prescribed for children as young as 13 years for selected conditions, precautions do apply.  Overdosage: If you think you have taken too much of this medicine contact a poison control center or emergency room at once.  NOTE: This medicine is only for you. Do not share this medicine with others.  What if I miss a dose?  It is important not to miss your dose. Call your doctor or health care professional if you are  unable to keep an appointment.  What may interact with this medicine?  Do not take this medicine with any of the following medications:  -other medicines containing denosumab  This medicine may also interact with the following medications:  -medicines that suppress the immune system  -medicines that treat cancer  -steroid medicines like prednisone or cortisone  This list may not describe all possible interactions. Give your health care provider a list of all the medicines, herbs, non-prescription drugs, or dietary supplements you use. Also tell them if you smoke, drink alcohol, or use illegal drugs. Some items may interact with your medicine.  What should I watch for while using this medicine?  Visit your doctor or health care professional for regular checks on your progress. Your doctor or health care professional may order blood tests and other tests to see how you are doing.  Call your doctor or health care professional if you get a cold or other infection while receiving this medicine. Do not treat yourself. This medicine may decrease your body's ability to fight infection.  You should make sure you get enough calcium and vitamin D while you are taking this medicine, unless your doctor tells you not to. Discuss the foods you eat and the vitamins you take with your health care professional.  See your dentist regularly. Brush and floss your teeth as directed. Before you have any dental work done, tell your dentist you are receiving this medicine.  Do   not become pregnant while taking this medicine or for 5 months after stopping it. Women should inform their doctor if they wish to become pregnant or think they might be pregnant. There is a potential for serious side effects to an unborn child. Talk to your health care professional or pharmacist for more information.  What side effects may I notice from receiving this medicine?  Side effects that you should report to your doctor or health care professional as soon as  possible:  -allergic reactions like skin rash, itching or hives, swelling of the face, lips, or tongue  -breathing problems  -chest pain  -fast, irregular heartbeat  -feeling faint or lightheaded, falls  -fever, chills, or any other sign of infection  -muscle spasms, tightening, or twitches  -numbness or tingling  -skin blisters or bumps, or is dry, peels, or red  -slow healing or unexplained pain in the mouth or jaw  -unusual bleeding or bruising  Side effects that usually do not require medical attention (Report these to your doctor or health care professional if they continue or are bothersome.):  -muscle pain  -stomach upset, gas  This list may not describe all possible side effects. Call your doctor for medical advice about side effects. You may report side effects to FDA at 1-800-FDA-1088.  Where should I keep my medicine?  This medicine is only given in a clinic, doctor's office, or other health care setting and will not be stored at home.  NOTE: This sheet is a summary. It may not cover all possible information. If you have questions about this medicine, talk to your doctor, pharmacist, or health care provider.      2016, [employer]vier/Gold Standard. (2012-04-02 12:37:47)

## 2016-04-08 NOTE — Progress Notes (Signed)
Hematology and Oncology Follow Up Visit  Steven Church [id number] [birth year] 65 y.o. 04/08/2016 11:51 AM   Principle Diagnosis: 65 year old gentleman with prostate cancer presented with diffuse bony metastasis and lymphadenopathy. His PSA is 899 with unknown Gleason score. His diagnosis was confirmed in June 2016. 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   Prior Therapy:  He is status post a biopsy obtained of the soft tissue in the retroperitoneal area on 04/03/2015. He is status post whole brain radiation for a total of 30 gray in 10 fractions completed in April 2017.  Current therapy:  Lupron 22.5 mg IM every 3 months started in June 2016.  Xgeva to start on 07/24/2015.  Zytiga 1000 mg started in April 2017.  Interim History:  Steven Church presents today for a follow-up visit. Since the last visit, he reports continuous improvement in his overall health. He does not report any residual respiratory symptoms from his pneumonia and his PICC line has been removed. He continued Zytiga without any specific complications such as weight loss, nausea or dyspepsia. His lower extremity edema has improved at this time. His appetite remain excellent and his performance status remains reasonable. His PSA continues to respond indicating excellent clinical benefit at this time.   He does not report any fevers, chills, sweats, weight loss or appetite changes. He does not report any chest pain, palpitation, orthopnea or leg edema. He does not report any cough, shortness of breath, dyspnea on exertion, wheezing or hemoptysis. He does not report any nausea, vomiting, abdominal pain, hematochezia, or early satiety. He does not report any hematuria or dysuria. He does report frequency, hesitancy and nocturia. He does not report any skeletal complaints of arthralgias or myalgias. Remaining review of systems unremarkable.  Medications: I have  reviewed the patient's current medications.  Current Outpatient Prescriptions  Medication Sig Dispense Refill  . amLODipine (NORVASC) 10 MG tablet Take 1 tablet (10 mg total) by mouth daily. 30 tablet 0  . Aspirin-Caffeine (BC FAST PAIN RELIEF PO) Take 1 Package by mouth as needed (pain). Reported on 03/21/2016    . calcitRIOL (ROCALTROL) 0.5 MCG capsule Take 1 capsule (0.5 mcg total) by mouth 2 (two) times daily. 60 capsule 0  . calcium carbonate (TUMS - DOSED IN MG ELEMENTAL CALCIUM) 500 MG chewable tablet Chew 5 tablets (1,000 mg of elemental calcium total) by mouth 3 (three) times daily. 90 tablet 0  . calcium-vitamin D 250-100 MG-UNIT per tablet Take 1 tablet by mouth 2 (two) times daily. 60 tablet 3  . cefTRIAXone (ROCEPHIN) 40 MG/ML IVPB Inject 50 mLs (2 g total) into the vein daily. Stop date 03/26/2016 50 mL 10  . dexamethasone (DECADRON) 4 MG tablet Take 1 tablet (4 mg total) by mouth daily. 30 tablet 0  . finasteride (PROSCAR) 5 MG tablet Take 1 tablet (5 mg total) by mouth daily. 90 tablet 0  . KLOR-CON M20 20 MEQ tablet Take 20 mEq by mouth 2 (two) times daily.  0  . losartan (COZAAR) 100 MG tablet Take 100 mg by mouth daily.  3  . omeprazole (PRILOSEC) 40 MG capsule TAKE 1 CAPSULE (40 MG TOTAL) BY MOUTH DAILY. 90 capsule 1  . tamsulosin (FLOMAX) 0.4 MG CAPS capsule TAKE ONE CAPSULE BY MOUTH EVERY DAY (Patient taking differently: TAKE 0.4 MG BY MOUTH EVERY DAY) 30 capsule 1  . Vitamin D, Ergocalciferol, (DRISDOL) 50000 units CAPS capsule Take 1 capsule (50,000 Units total) by mouth  every other day. 30 capsule 0  . ZYTIGA 250 MG tablet TAKE FOUR TABLETS BY MOUTH ONE TIME DAILY ON AN EMPTY STOMACH, AT LEAST 1 HOUR BEFORE AND 2 HOURS AFTER FOOD. 120 tablet 0   No current facility-administered medications for this visit.     Allergies:  Allergies  Allergen Reactions  . Losartan Potassium-Hctz Other (See Comments)    dizziness    Past Medical History, Surgical history, Social  history, and Family History were reviewed and updated.   Physical Exam: Blood pressure 136/75, pulse 85, temperature 98.5 F (36.9 C), temperature source Oral, resp. rate 18, height 5\' 11"  (1.803 m), weight 217 lb 11.2 oz (98.748 kg), SpO2 100 %. ECOG: 1 General appearance: Alert, awake gentleman appeared comfortable. Head: Normocephalic, without obvious abnormality no oral thrush. Neck: no adenopathy Lymph nodes: Cervical, supraclavicular, and axillary nodes normal. Heart:regular rate and rhythm, S1, S2 normal, no murmur, click, rub or gallop Lung:chest clear, no wheezing, rales, normal symmetric air entry Abdomin: soft, non-tender, without masses or organomegaly no no shifting dullness or ascites. EXT:no erythema, induration, or nodules Neurological examination: No deficits noted.  Lab Results: Lab Results  Component Value Date   WBC 7.4 04/08/2016   HGB 9.9* 04/08/2016   HCT 30.1* 04/08/2016   MCV 86.8 04/08/2016   PLT 148 04/08/2016     Chemistry      Component Value Date/Time   NA 140 03/23/2016 1514   NA 142 03/16/2016 0454   K 4.1 03/23/2016 1514   K 3.7 03/16/2016 0454   CL 110 03/16/2016 0454   CO2 19* 03/23/2016 1514   CO2 23 03/16/2016 0454   BUN 21.2 03/23/2016 1514   BUN 36* 03/16/2016 0454   CREATININE 1.6* 03/23/2016 1514   CREATININE 2.40* 03/16/2016 0454   CREATININE 1.02 01/21/2016 0001      Component Value Date/Time   CALCIUM 7.2* 03/23/2016 1514   CALCIUM 7.3* 03/16/2016 0454   ALKPHOS 1,808* 03/23/2016 1514   ALKPHOS 1487* 03/16/2016 0454   AST 12 03/23/2016 1514   AST 19 03/16/2016 0454   ALT 13 03/23/2016 1514   ALT 18 03/16/2016 0454   BILITOT <0.30 03/23/2016 1514   BILITOT 0.5 03/16/2016 0454      Results for Steven Church, Steven SR. (MRN [id number]) as of 04/08/2016 11:39  Ref. Range 03/04/2016 11:25 03/23/2016 15:14  PSA Latest Ref Range: 0.0-4.0 ng/mL 144.1 (H) 104.9 (H)      Impression and Plan:  65 year old gentleman with the  following issues:  1. Advanced prostate cancer presenting with diffuse bony metastasis and bulky lymphadenopathy and PSA of 899.8. This is biopsy proven to be prostate cancer.   He is currently on Lupron started in June 2016 but have developed castration resistant disease with a PSA rise up to 702.5 As well as brain metastasis.  He is currently on Zytiga and have tolerated it well. His PSA had an excellent response with a PSA dropped from 702 to 104.9. He is benefiting clinically from this therapy and the plan is to continue the same dose and schedule. He does not report any side effects associated with it.   2. Bone directed therapy: He received Xgeva without any major complications. He will receive this with every visit on a monthly basis.  3. Dural/epidural mass noted over the frontal lobe with moderate regional mass effect noted on an MRI on 01/22/2016. He is status post whole brain radiation. He is currently on dexamethasone 2 mg with a very slow  taper.  4. Pain: Seems to be reasonably controlled with mild analgesia. No changes noted.  5. Prognosis: His prognosis is rather poor given the aggressive nature of his cancer and side of metastasis. He is improving clinically and responding to therapy. He would like to be aggressive for therapy moving forward.  6. Androgen deprivation: He will continue on Lupron 22.5 mg every 3 months and he will receive his next injection in October 2017.  7. Community-acquired pneumonia: He is currently receiving intravenous antibiotics via PICC line. His line has been removed and he recovered from his infection.  8. Follow-up: Will be in 4 weeks to evaluate his progress as well as his response to Zytiga.     [facility], MD 6/23/201711:51 AM

## 2016-04-09 LAB — PSA: PROSTATE SPECIFIC AG, SERUM: 70 ng/mL — AB (ref 0.0–4.0)

## 2016-04-11 ENCOUNTER — Encounter: Payer: Managed Care, Other (non HMO) | Admitting: Speech Pathology

## 2016-04-11 ENCOUNTER — Other Ambulatory Visit: Payer: Self-pay | Admitting: Oncology

## 2016-04-12 ENCOUNTER — Telehealth: Payer: Self-pay | Admitting: Medical

## 2016-04-12 ENCOUNTER — Other Ambulatory Visit: Payer: Self-pay | Admitting: Medical

## 2016-04-12 ENCOUNTER — Telehealth: Payer: Self-pay | Admitting: Radiation Oncology

## 2016-04-12 NOTE — Telephone Encounter (Signed)
Pt called requesting refill on Calcium Vit D 250-100 MG. Pt says pharmacy should have sent refill request for this med to [facility] because pt would like for Audelia Acton to start maintaining this med if he would since he was given this while in the hospital.

## 2016-04-12 NOTE — Telephone Encounter (Signed)
Phoned patient to inquire if PICC line has been removed and if he is ready to resume speech therapy. No answer. Left message requesting return call.

## 2016-04-12 NOTE — Telephone Encounter (Signed)
Is this okay to refill? 

## 2016-04-12 NOTE — Telephone Encounter (Signed)
I received refill request and sent to pharmacy

## 2016-04-13 ENCOUNTER — Other Ambulatory Visit: Payer: Self-pay | Admitting: Radiation Oncology

## 2016-04-13 ENCOUNTER — Other Ambulatory Visit: Payer: Self-pay | Admitting: Medical

## 2016-04-13 DIAGNOSIS — C61 Malignant neoplasm of prostate: Secondary | ICD-10-CM

## 2016-04-13 MED ORDER — CALCIUM CITRATE-VITAMIN D 250-100 MG-UNIT PO TABS: 1.0000 | ORAL_TABLET | Freq: Two times a day (BID) | ORAL | Status: DC

## 2016-04-13 NOTE — Telephone Encounter (Signed)
INFORMED PT THAT RX WAS SENT IN TO PHARMACY

## 2016-04-13 NOTE — Telephone Encounter (Signed)
I sent yesterday, I sent again just now

## 2016-04-13 NOTE — Telephone Encounter (Signed)
Pt called back and states that the RX was not sent in to the pharmacy. PT needs calcium-vitamin D 250-100 MG-UNIT per tablet [id number] [place], [place] please call the pt and let him know this was sent him, he states he has been trying to get this done for 3 days

## 2016-04-15 ENCOUNTER — Telehealth: Payer: Self-pay | Admitting: Family Medicine

## 2016-04-15 MED ORDER — CALCITRIOL 0.5 MCG PO CAPS: 0.5000 ug | ORAL_CAPSULE | Freq: Two times a day (BID) | ORAL | Status: DC

## 2016-04-15 NOTE — Telephone Encounter (Signed)
Pls refill this

## 2016-04-15 NOTE — Telephone Encounter (Signed)
Pt needs [facility]ill [facility]

## 2016-04-15 NOTE — Telephone Encounter (Signed)
done

## 2016-04-18 ENCOUNTER — Telehealth: Payer: Self-pay | Admitting: Medical

## 2016-04-18 ENCOUNTER — Other Ambulatory Visit: Payer: Self-pay | Admitting: Oncology

## 2016-04-18 ENCOUNTER — Other Ambulatory Visit: Payer: Self-pay | Admitting: *Deleted

## 2016-04-18 DIAGNOSIS — C61 Malignant neoplasm of prostate: Secondary | ICD-10-CM

## 2016-04-18 DIAGNOSIS — C7931 Secondary malignant neoplasm of brain: Secondary | ICD-10-CM

## 2016-04-18 DIAGNOSIS — C7951 Secondary malignant neoplasm of bone: Secondary | ICD-10-CM

## 2016-04-18 MED ORDER — ABIRATERONE ACETATE 250 MG PO TABS: ORAL_TABLET | ORAL | Status: DC

## 2016-04-18 NOTE — Telephone Encounter (Signed)
Pt came in and requested a refill of finasteride. [facility] [address] Pt was informed that Audelia Acton not in.

## 2016-04-19 ENCOUNTER — Other Ambulatory Visit: Payer: Self-pay | Admitting: Medical

## 2016-04-19 MED ORDER — FINASTERIDE 5 MG PO TABS: 5.0000 mg | ORAL_TABLET | Freq: Every day | ORAL | Status: DC

## 2016-04-19 NOTE — Telephone Encounter (Signed)
Since this is a 90 day supply med I sent to the mail order.   At his next f/u with oncology , have him make sure his oncologist is ok with all the current medications listed.   I assume Finasteride is still ok since it was still listed in the last oncology note.

## 2016-04-20 ENCOUNTER — Telehealth: Payer: Self-pay

## 2016-04-20 MED ORDER — FINASTERIDE 5 MG PO TABS: 5.0000 mg | ORAL_TABLET | Freq: Every day | ORAL | Status: DC

## 2016-04-20 NOTE — Telephone Encounter (Signed)
Pt is aware does not use mail order has to be resent. Pt will talk to oncologist

## 2016-04-20 NOTE — Telephone Encounter (Signed)
Had to send finasteride to correct pharmacy.

## 2016-04-25 ENCOUNTER — Encounter: Payer: Self-pay | Admitting: Oncology

## 2016-04-25 NOTE — Progress Notes (Signed)
left in box 04/25/16

## 2016-04-26 ENCOUNTER — Telehealth: Payer: Self-pay | Admitting: Radiation Oncology

## 2016-04-26 ENCOUNTER — Encounter: Payer: Self-pay | Admitting: Oncology

## 2016-04-26 ENCOUNTER — Other Ambulatory Visit: Payer: Self-pay | Admitting: Radiation Oncology

## 2016-04-26 DIAGNOSIS — C7931 Secondary malignant neoplasm of brain: Secondary | ICD-10-CM

## 2016-04-26 DIAGNOSIS — C7949 Secondary malignant neoplasm of other parts of nervous system: Principal | ICD-10-CM

## 2016-04-26 MED ORDER — DEXAMETHASONE 2 MG PO TABS: 2.0000 mg | ORAL_TABLET | Freq: Every day | ORAL | Status: DC

## 2016-04-26 NOTE — Progress Notes (Signed)
left in box 04/25/16- left for dr. [name] to sign- faxed [employer] and sent to [facility]-copy at front for patient pk up with ms wilma

## 2016-04-26 NOTE — Progress Notes (Signed)
left in box 04/25/16- left for dr. [name] to sign

## 2016-04-26 NOTE — Telephone Encounter (Signed)
Confirmed receipt of prescription refill for decadron 2 mg daily.

## 2016-04-26 NOTE — Telephone Encounter (Signed)
Done, I gave him 2 mg tablets now so he can more easily taper

## 2016-04-26 NOTE — Telephone Encounter (Signed)
Phoned patient to inquire about status. Patient reports he is breaking his 4 mg decadron tablet in half and taking a half a pill per day. Reports his sister comes by each week and arranges his pills in a pill case for him. He explains he needs a [facility] of [facility] [address] Told patient this RN would call him once she called in the script and he told me there was no need because he had set up text notification. He states, " I feel fine." Speech is only occasionally garbled but, clear when he speaks slowly. Reports he is aware of his appointment with Dr. [name] Friday at 1. Denies headache, dizziness, nausea or vomiting. Denies any swelling in his eyes. Reports SOB of breath with exertion. Reports his ankle swelling has resolved. The only alarming thing he mentioned during our conversation was "the need to get back right so he can go back to work." This is concerning for lack of acceptance of disease process. Patient expressed appreciation for my call.

## 2016-04-27 ENCOUNTER — Other Ambulatory Visit: Payer: Self-pay | Admitting: Medical

## 2016-04-29 ENCOUNTER — Other Ambulatory Visit (HOSPITAL_BASED_OUTPATIENT_CLINIC_OR_DEPARTMENT_OTHER): Payer: Managed Care, Other (non HMO)

## 2016-04-29 ENCOUNTER — Ambulatory Visit (HOSPITAL_BASED_OUTPATIENT_CLINIC_OR_DEPARTMENT_OTHER): Payer: Managed Care, Other (non HMO) | Admitting: Oncology

## 2016-04-29 ENCOUNTER — Other Ambulatory Visit: Payer: Self-pay | Admitting: *Deleted

## 2016-04-29 ENCOUNTER — Ambulatory Visit (HOSPITAL_BASED_OUTPATIENT_CLINIC_OR_DEPARTMENT_OTHER): Payer: Managed Care, Other (non HMO)

## 2016-04-29 ENCOUNTER — Telehealth: Payer: Self-pay | Admitting: Oncology

## 2016-04-29 VITALS — BP 117/73 | HR 73 | Temp 98.8°F | Resp 18 | Ht 71.0 in | Wt 217.4 lb

## 2016-04-29 DIAGNOSIS — C61 Malignant neoplasm of prostate: Secondary | ICD-10-CM | POA: Diagnosis not present

## 2016-04-29 DIAGNOSIS — E291 Testicular hypofunction: Secondary | ICD-10-CM

## 2016-04-29 DIAGNOSIS — C7951 Secondary malignant neoplasm of bone: Secondary | ICD-10-CM

## 2016-04-29 DIAGNOSIS — C7931 Secondary malignant neoplasm of brain: Secondary | ICD-10-CM

## 2016-04-29 LAB — COMPREHENSIVE METABOLIC PANEL
ALT: 14 U/L (ref 0–55)
AST: 13 U/L (ref 5–34)
Albumin: 3.4 g/dL — ABNORMAL LOW (ref 3.5–5.0)
Anion Gap: 9 mEq/L (ref 3–11)
BILIRUBIN TOTAL: 0.55 mg/dL (ref 0.20–1.20)
BUN: 16 mg/dL (ref 7.0–26.0)
CHLORIDE: 112 meq/L — AB (ref 98–109)
CO2: 18 mEq/L — ABNORMAL LOW (ref 22–29)
CREATININE: 0.8 mg/dL (ref 0.7–1.3)
Calcium: 8 mg/dL — ABNORMAL LOW (ref 8.4–10.4)
EGFR: >90 mL/min/{1.73_m2} (ref >90)
Glucose: 118 mg/dl (ref 70–140)
Potassium: 4.4 mEq/L (ref 3.5–5.1)
Sodium: 139 mEq/L (ref 136–145)
Total Protein: 6.9 g/dL (ref 6.4–8.3)

## 2016-04-29 LAB — CBC WITH DIFFERENTIAL/PLATELET
BASO%: 0.5 % (ref 0.0–2.0)
BASOS ABS: 0 10*3/uL (ref 0.0–0.1)
EOS%: 0.1 % (ref 0.0–7.0)
Eosinophils Absolute: 0 10*3/uL (ref 0.0–0.5)
HEMATOCRIT: 34.2 % — AB (ref 38.4–49.9)
HGB: 11.3 g/dL — ABNORMAL LOW (ref 13.0–17.1)
LYMPH#: 0.9 10*3/uL (ref 0.9–3.3)
LYMPH%: 12.5 % — ABNORMAL LOW (ref 14.0–49.0)
MCH: 28.5 pg (ref 27.2–33.4)
MCHC: 33 g/dL (ref 32.0–36.0)
MCV: 86.6 fL (ref 79.3–98.0)
MONO#: 0.5 10*3/uL (ref 0.1–0.9)
MONO%: 6.5 % (ref 0.0–14.0)
NEUT#: 5.9 10*3/uL (ref 1.5–6.5)
NEUT%: 80.4 % — AB (ref 39.0–75.0)
PLATELETS: 209 10*3/uL (ref 140–400)
RBC: 3.95 10*6/uL — ABNORMAL LOW (ref 4.20–5.82)
RDW: 20.2 % — ABNORMAL HIGH (ref 11.0–14.6)
WBC: 7.3 10*3/uL (ref 4.0–10.3)

## 2016-04-29 MED ORDER — ABIRATERONE ACETATE 250 MG PO TABS: ORAL_TABLET | ORAL | Status: DC

## 2016-04-29 MED ORDER — DENOSUMAB 120 MG/1.7ML ~~LOC~~ SOLN
120.0000 mg | Freq: Once | SUBCUTANEOUS | Status: AC
  Administered 2016-04-29: 120 mg via SUBCUTANEOUS
  Filled 2016-04-29: qty 1.7

## 2016-04-29 NOTE — Patient Instructions (Signed)
Denosumab injection  What is this medicine?  DENOSUMAB (den oh sue mab) slows bone breakdown. Prolia is used to treat osteoporosis in women after menopause and in men. Xgeva is used to prevent bone fractures and other bone problems caused by cancer bone metastases. Xgeva is also used to treat giant cell tumor of the bone.  This medicine may be used for other purposes; ask your health care provider or pharmacist if you have questions.  What should I tell my health care provider before I take this medicine?  They need to know if you have any of these conditions:  -dental disease  -eczema  -infection or history of infections  -kidney disease or on dialysis  -low blood calcium or vitamin D  -malabsorption syndrome  -scheduled to have surgery or tooth extraction  -taking medicine that contains denosumab  -thyroid or parathyroid disease  -an unusual reaction to denosumab, other medicines, foods, dyes, or preservatives  -pregnant or trying to get pregnant  -breast-feeding  How should I use this medicine?  This medicine is for injection under the skin. It is given by a health care professional in a hospital or clinic setting.  If you are getting Prolia, a special MedGuide will be given to you by the pharmacist with each prescription and refill. Be sure to read this information carefully each time.  For Prolia, talk to your pediatrician regarding the use of this medicine in children. Special care may be needed. For Xgeva, talk to your pediatrician regarding the use of this medicine in children. While this drug may be prescribed for children as young as 13 years for selected conditions, precautions do apply.  Overdosage: If you think you have taken too much of this medicine contact a poison control center or emergency room at once.  NOTE: This medicine is only for you. Do not share this medicine with others.  What if I miss a dose?  It is important not to miss your dose. Call your doctor or health care professional if you are  unable to keep an appointment.  What may interact with this medicine?  Do not take this medicine with any of the following medications:  -other medicines containing denosumab  This medicine may also interact with the following medications:  -medicines that suppress the immune system  -medicines that treat cancer  -steroid medicines like prednisone or cortisone  This list may not describe all possible interactions. Give your health care provider a list of all the medicines, herbs, non-prescription drugs, or dietary supplements you use. Also tell them if you smoke, drink alcohol, or use illegal drugs. Some items may interact with your medicine.  What should I watch for while using this medicine?  Visit your doctor or health care professional for regular checks on your progress. Your doctor or health care professional may order blood tests and other tests to see how you are doing.  Call your doctor or health care professional if you get a cold or other infection while receiving this medicine. Do not treat yourself. This medicine may decrease your body's ability to fight infection.  You should make sure you get enough calcium and vitamin D while you are taking this medicine, unless your doctor tells you not to. Discuss the foods you eat and the vitamins you take with your health care professional.  See your dentist regularly. Brush and floss your teeth as directed. Before you have any dental work done, tell your dentist you are receiving this medicine.  Do   not become pregnant while taking this medicine or for 5 months after stopping it. Women should inform their doctor if they wish to become pregnant or think they might be pregnant. There is a potential for serious side effects to an unborn child. Talk to your health care professional or pharmacist for more information.  What side effects may I notice from receiving this medicine?  Side effects that you should report to your doctor or health care professional as soon as  possible:  -allergic reactions like skin rash, itching or hives, swelling of the face, lips, or tongue  -breathing problems  -chest pain  -fast, irregular heartbeat  -feeling faint or lightheaded, falls  -fever, chills, or any other sign of infection  -muscle spasms, tightening, or twitches  -numbness or tingling  -skin blisters or bumps, or is dry, peels, or red  -slow healing or unexplained pain in the mouth or jaw  -unusual bleeding or bruising  Side effects that usually do not require medical attention (Report these to your doctor or health care professional if they continue or are bothersome.):  -muscle pain  -stomach upset, gas  This list may not describe all possible side effects. Call your doctor for medical advice about side effects. You may report side effects to FDA at 1-800-FDA-1088.  Where should I keep my medicine?  This medicine is only given in a clinic, doctor's office, or other health care setting and will not be stored at home.  NOTE: This sheet is a summary. It may not cover all possible information. If you have questions about this medicine, talk to your doctor, pharmacist, or health care provider.      2016, [employer]vier/Gold Standard. (2012-04-02 12:37:47)

## 2016-04-29 NOTE — Progress Notes (Signed)
Verbal consent given  By Dr. [name] to given patient injection of Xgeva 120 today.

## 2016-04-29 NOTE — Progress Notes (Signed)
Hematology and Oncology Follow Up Visit  Steven Church [id number] 01-30-1951 65 y.o. 04/29/2016 12:44 PM   Principle Diagnosis: 65 year old gentleman with prostate cancer presented with diffuse bony metastasis and lymphadenopathy. His PSA is 899 with unknown Gleason score. His diagnosis was confirmed in June 2016. 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   Prior Therapy:  He is status post a biopsy obtained of the soft tissue in the retroperitoneal area on 04/03/2015. He is status post whole brain radiation for a total of 30 gray in 10 fractions completed in April 2017.  Current therapy:  Lupron 22.5 mg IM every 3 months started in June 2016.  Xgeva to start on 07/24/2015.  Zytiga 1000 mg started in April 2017.  Interim History:  Mr. Steven Church presents today for a follow-up visit. Since the last visit, he reports feeling well without any new complaints. He continues Zytiga without any specific complications such as weight loss, nausea or dyspepsia. His lower extremity edema has resolved. His appetite remain excellent and his performance status remains reasonable. He denied any new neurological deficits and his speech is improving. He denied any recent hospitalization or illnesses.   He does not report any headaches, blurry vision, syncope or seizures. He does not report any fevers, chills, sweats, weight loss or appetite changes. He does not report any chest pain, palpitation, orthopnea or leg edema. He does not report any cough, shortness of breath, dyspnea on exertion, wheezing or hemoptysis. He does not report any nausea, vomiting, abdominal pain, hematochezia, or early satiety. He does not report any hematuria or dysuria. He does report frequency, hesitancy and nocturia. He does not report any skeletal complaints of arthralgias or myalgias. Remaining review of systems unremarkable.  Medications: I have reviewed the  patient's current medications.  Current Outpatient Prescriptions  Medication Sig Dispense Refill  . amLODipine (NORVASC) 10 MG tablet TAKE 1 TABLET BY MOUTH EVERY DAY 30 tablet 1  . Aspirin-Caffeine (BC FAST PAIN RELIEF PO) Take 1 Package by mouth as needed (pain). Reported on 03/21/2016    . calcitRIOL (ROCALTROL) 0.5 MCG capsule Take 1 capsule (0.5 mcg total) by mouth 2 (two) times daily. 60 capsule 0  . calcium carbonate (TUMS - DOSED IN MG ELEMENTAL CALCIUM) 500 MG chewable tablet Chew 5 tablets (1,000 mg of elemental calcium total) by mouth 3 (three) times daily. 90 tablet 0  . calcium-vitamin D 250-100 MG-UNIT tablet Take 1 tablet by mouth 2 (two) times daily. 180 tablet 3  . [facility] OYSTER SHELL CALCIUM+VIT D 500-125 MG-UNIT TABS TAKE 1 TABLET BY MOUTH TWICE A DAY 60 tablet 3  . dexamethasone (DECADRON) 2 MG tablet Take 1 tablet (2 mg total) by mouth daily. 30 tablet 0  . finasteride (PROSCAR) 5 MG tablet Take 1 tablet (5 mg total) by mouth daily. 90 tablet 1  . KLOR-CON M20 20 MEQ tablet Take 20 mEq by mouth 2 (two) times daily.  0  . losartan (COZAAR) 100 MG tablet Take 100 mg by mouth daily.  3  . omeprazole (PRILOSEC) 40 MG capsule TAKE 1 CAPSULE (40 MG TOTAL) BY MOUTH DAILY. 90 capsule 1  . tamsulosin (FLOMAX) 0.4 MG CAPS capsule TAKE 1 CAPSULE (0.4 MG TOTAL) BY MOUTH DAILY. 90 capsule 1  . Vitamin D, Ergocalciferol, (DRISDOL) 50000 units CAPS capsule Take 1 capsule (50,000 Units total) by mouth every other day. 30 capsule 0  . abiraterone Acetate (ZYTIGA) 250 MG tablet TAKE FOUR TABLETS  BY MOUTH ONE TIME DAILY ON AN EMPTY STOMACH, AT LEAST 1 HOUR BEFORE AND 2 HOURS AFTER FOOD. 120 tablet 0   No current facility-administered medications for this visit.     Allergies:  Allergies  Allergen Reactions  . Losartan Potassium-Hctz Other (See Comments)    dizziness    Past Medical History, Surgical history, Social history, and Family History were reviewed and updated.   Physical  Exam: Blood pressure 117/73, pulse 73, temperature 98.8 F (37.1 C), temperature source Oral, resp. rate 18, height 5\' 11"  (1.803 m), weight 217 lb 6.4 oz (98.612 kg), SpO2 99 %. ECOG: 1 General appearance: Well-appearing gentleman appeared without distress. Head: Normocephalic, without obvious abnormality no oral ulcers or lesions. Neck: no adenopathy Lymph nodes: Cervical, supraclavicular, and axillary nodes normal. Heart:regular rate and rhythm, S1, S2 normal, no murmur, click, rub or gallop Lung:chest clear, no wheezing, rales, normal symmetric air entry Abdomin: soft, non-tender, without masses or organomegaly no rebound or guarding. EXT:no erythema, induration, or nodules Neurological examination: No deficits noted.  Lab Results: Lab Results  Component Value Date   WBC 7.3 04/29/2016   HGB 11.3* 04/29/2016   HCT 34.2* 04/29/2016   MCV 86.6 04/29/2016   PLT 209 04/29/2016     Chemistry      Component Value Date/Time   NA 140 04/08/2016 1123   NA 142 03/16/2016 0454   K 4.0 04/08/2016 1123   K 3.7 03/16/2016 0454   CL 110 03/16/2016 0454   CO2 19* 04/08/2016 1123   CO2 23 03/16/2016 0454   BUN 15.8 04/08/2016 1123   BUN 36* 03/16/2016 0454   CREATININE 0.9 04/08/2016 1123   CREATININE 2.40* 03/16/2016 0454   CREATININE 1.02 01/21/2016 0001      Component Value Date/Time   CALCIUM 8.1* 04/08/2016 1123   CALCIUM 7.3* 03/16/2016 0454   ALKPHOS 1,470* 04/08/2016 1123   ALKPHOS 1487* 03/16/2016 0454   AST 13 04/08/2016 1123   AST 19 03/16/2016 0454   ALT 16 04/08/2016 1123   ALT 18 03/16/2016 0454   BILITOT 0.69 04/08/2016 1123   BILITOT 0.5 03/16/2016 0454         Results for Steven Church. (MRN [id number]) as of 04/29/2016 12:37  Ref. Range 01/22/2016 11:15 02/19/2016 12:31 03/04/2016 11:25 03/23/2016 15:14 04/08/2016 11:23  PSA Latest Ref Range: 0.0-4.0 ng/mL 702.5 (H) 152.7 (H) 144.1 (H) 104.9 (H) 70.0 (H)    Impression and Plan:  65 year old gentleman  with the following issues:  1. Advanced prostate cancer presenting with diffuse bony metastasis and bulky lymphadenopathy and PSA of 899.8. This is biopsy proven to be prostate cancer.   He is currently on Lupron started in June 2016 but have developed castration resistant disease with a PSA rise up to 702.5 As well as brain metastasis.  He is currently on Zytiga and have tolerated it well. His PSA continues to decline rapidly and currently at 70 a drop from 700. His clinical status remains excellent and improving. The plan is to continue with the same dose and schedule given his excellent tolerance and maximum clinical benefit.   2. Bone directed therapy: He received Xgeva without any major complications. He will receive this with every visit on a monthly basis.  3. Dural/epidural mass: He is status post whole brain radiation without any residual complications. His speech continues to improve and nearly normalized.  4. Pain: Is no longer an issue for him at this time and continues to ambulate without any  difficulties.  5. Prognosis: His prognosis is rather poor given the aggressive nature of his cancer and side of metastasis. He has recovered fairly well from his recent illness but his prognosis remains guarded at this time. I recommended continued aggressive therapy given his excellent response at this time.  6. Androgen deprivation: He will continue on Lupron 22.5 mg every 3 months and he will receive his next injection in October 2017.  7. Follow-up: Will be in 4 weeks to evaluate his progress as well as his response to Zytiga.     [facility], MD 7/14/201712:44 PM

## 2016-04-29 NOTE — Telephone Encounter (Signed)
per pof to sch pt apt-gave pt copy of avs

## 2016-04-30 LAB — PSA: Prostate Specific Ag, Serum: 46.7 ng/mL — ABNORMAL HIGH (ref 0.0–4.0)

## 2016-05-02 ENCOUNTER — Telehealth: Payer: Self-pay | Admitting: Radiation Oncology

## 2016-05-02 ENCOUNTER — Encounter: Payer: Self-pay | Admitting: *Deleted

## 2016-05-02 NOTE — Telephone Encounter (Signed)
Provided Verdie Drown with filled out and signed Snyder to mail and fax. Also, provided her with complete [employer] UPDATE FORM to mail.

## 2016-05-02 NOTE — Progress Notes (Signed)
Patient states he is out of zytiga. Order was placed on 04/29/16. Receipt confirmed by pharmacy. Let him know it should be here any day now.

## 2016-05-05 ENCOUNTER — Encounter: Payer: Self-pay | Admitting: Oncology

## 2016-05-05 NOTE — Progress Notes (Signed)
recd in my box   [place] forms

## 2016-05-09 ENCOUNTER — Encounter: Payer: Self-pay | Admitting: Oncology

## 2016-05-09 NOTE — Progress Notes (Signed)
recd in my box&nbsp;&nbsp; [employer] forms-left for dr. [name] to sign-faxed [phone] and mailed to patient and sent to [facility]

## 2016-05-10 ENCOUNTER — Other Ambulatory Visit: Payer: Self-pay | Admitting: Medical

## 2016-05-11 ENCOUNTER — Telehealth: Payer: Self-pay | Admitting: Medical

## 2016-05-11 ENCOUNTER — Other Ambulatory Visit: Payer: Self-pay | Admitting: Medical

## 2016-05-11 NOTE — Telephone Encounter (Addendum)
[facility] Calcitriol 0.5 mcg & Amlodipine 10 mg should be refilled at a 90 day supply

## 2016-05-11 NOTE — Telephone Encounter (Signed)
Pls call pharmacy to make sure he has 90 day supply.   Th problems is that he is seeing oncology and they have refilled some of his medication as well.   Make sure they feel clear on what we prescribe, what oncology prescribes. I don't want him running out of medication, but pharmacy needs to be aware that other doctors prescribe some of his medications

## 2016-05-11 NOTE — Telephone Encounter (Signed)
Updated all to 90 day supplies, and clarified medications

## 2016-05-23 ENCOUNTER — Other Ambulatory Visit: Payer: Self-pay | Admitting: Oncology

## 2016-05-23 DIAGNOSIS — C7931 Secondary malignant neoplasm of brain: Secondary | ICD-10-CM

## 2016-05-23 DIAGNOSIS — C7951 Secondary malignant neoplasm of bone: Secondary | ICD-10-CM

## 2016-05-23 DIAGNOSIS — C61 Malignant neoplasm of prostate: Secondary | ICD-10-CM

## 2016-05-31 ENCOUNTER — Other Ambulatory Visit (HOSPITAL_BASED_OUTPATIENT_CLINIC_OR_DEPARTMENT_OTHER): Payer: Managed Care, Other (non HMO)

## 2016-05-31 ENCOUNTER — Ambulatory Visit (HOSPITAL_BASED_OUTPATIENT_CLINIC_OR_DEPARTMENT_OTHER): Payer: Managed Care, Other (non HMO)

## 2016-05-31 ENCOUNTER — Telehealth: Payer: Self-pay | Admitting: Oncology

## 2016-05-31 ENCOUNTER — Ambulatory Visit (HOSPITAL_BASED_OUTPATIENT_CLINIC_OR_DEPARTMENT_OTHER): Payer: Managed Care, Other (non HMO) | Admitting: Oncology

## 2016-05-31 VITALS — BP 119/67 | HR 85 | Temp 98.5°F | Resp 18 | Ht 71.0 in | Wt 219.8 lb

## 2016-05-31 DIAGNOSIS — C7931 Secondary malignant neoplasm of brain: Secondary | ICD-10-CM | POA: Diagnosis not present

## 2016-05-31 DIAGNOSIS — C61 Malignant neoplasm of prostate: Secondary | ICD-10-CM

## 2016-05-31 DIAGNOSIS — C7951 Secondary malignant neoplasm of bone: Secondary | ICD-10-CM

## 2016-05-31 DIAGNOSIS — E291 Testicular hypofunction: Secondary | ICD-10-CM | POA: Diagnosis not present

## 2016-05-31 LAB — CBC WITH DIFFERENTIAL/PLATELET
BASO%: 0.5 % (ref 0.0–2.0)
BASOS ABS: 0 10*3/uL (ref 0.0–0.1)
EOS%: 0.6 % (ref 0.0–7.0)
Eosinophils Absolute: 0 10*3/uL (ref 0.0–0.5)
HEMATOCRIT: 37.7 % — AB (ref 38.4–49.9)
HGB: 12.2 g/dL — ABNORMAL LOW (ref 13.0–17.1)
LYMPH%: 14.7 % (ref 14.0–49.0)
MCH: 29 pg (ref 27.2–33.4)
MCHC: 32.4 g/dL (ref 32.0–36.0)
MCV: 89.4 fL (ref 79.3–98.0)
MONO#: 0.5 10*3/uL (ref 0.1–0.9)
MONO%: 6.7 % (ref 0.0–14.0)
NEUT#: 6.2 10*3/uL (ref 1.5–6.5)
NEUT%: 77.5 % — AB (ref 39.0–75.0)
Platelets: 213 10*3/uL (ref 140–400)
RBC: 4.21 10*6/uL (ref 4.20–5.82)
RDW: 16.6 % — ABNORMAL HIGH (ref 11.0–14.6)
WBC: 8 10*3/uL (ref 4.0–10.3)
lymph#: 1.2 10*3/uL (ref 0.9–3.3)

## 2016-05-31 LAB — COMPREHENSIVE METABOLIC PANEL
ALBUMIN: 3.3 g/dL — AB (ref 3.5–5.0)
ALK PHOS: 618 U/L — AB (ref 40–150)
ALT: 60 U/L — ABNORMAL HIGH (ref 0–55)
AST: 32 U/L (ref 5–34)
Anion Gap: 8 mEq/L (ref 3–11)
BILIRUBIN TOTAL: 0.67 mg/dL (ref 0.20–1.20)
BUN: 9.8 mg/dL (ref 7.0–26.0)
CALCIUM: 7.8 mg/dL — AB (ref 8.4–10.4)
CO2: 20 mEq/L — ABNORMAL LOW (ref 22–29)
Chloride: 113 mEq/L — ABNORMAL HIGH (ref 98–109)
Creatinine: 0.8 mg/dL (ref 0.7–1.3)
EGFR: >90 mL/min/{1.73_m2} (ref >90)
Glucose: 97 mg/dl (ref 70–140)
POTASSIUM: 3.8 meq/L (ref 3.5–5.1)
SODIUM: 141 meq/L (ref 136–145)
Total Protein: 6.9 g/dL (ref 6.4–8.3)

## 2016-05-31 MED ORDER — LEUPROLIDE ACETATE (4 MONTH) 30 MG IM KIT: 30.0000 mg | PACK | Freq: Once | INTRAMUSCULAR | Status: DC

## 2016-05-31 MED ORDER — DENOSUMAB 120 MG/1.7ML ~~LOC~~ SOLN
120.0000 mg | Freq: Once | SUBCUTANEOUS | Status: AC
  Administered 2016-05-31: 120 mg via SUBCUTANEOUS
  Filled 2016-05-31: qty 1.7

## 2016-05-31 NOTE — Patient Instructions (Signed)
Denosumab injection  What is this medicine?  DENOSUMAB (den oh sue mab) slows bone breakdown. Prolia is used to treat osteoporosis in women after menopause and in men. Xgeva is used to prevent bone fractures and other bone problems caused by cancer bone metastases. Xgeva is also used to treat giant cell tumor of the bone.  This medicine may be used for other purposes; ask your health care provider or pharmacist if you have questions.  What should I tell my health care provider before I take this medicine?  They need to know if you have any of these conditions:  -dental disease  -eczema  -infection or history of infections  -kidney disease or on dialysis  -low blood calcium or vitamin D  -malabsorption syndrome  -scheduled to have surgery or tooth extraction  -taking medicine that contains denosumab  -thyroid or parathyroid disease  -an unusual reaction to denosumab, other medicines, foods, dyes, or preservatives  -pregnant or trying to get pregnant  -breast-feeding  How should I use this medicine?  This medicine is for injection under the skin. It is given by a health care professional in a hospital or clinic setting.  If you are getting Prolia, a special MedGuide will be given to you by the pharmacist with each prescription and refill. Be sure to read this information carefully each time.  For Prolia, talk to your pediatrician regarding the use of this medicine in children. Special care may be needed. For Xgeva, talk to your pediatrician regarding the use of this medicine in children. While this drug may be prescribed for children as young as 13 years for selected conditions, precautions do apply.  Overdosage: If you think you have taken too much of this medicine contact a poison control center or emergency room at once.  NOTE: This medicine is only for you. Do not share this medicine with others.  What if I miss a dose?  It is important not to miss your dose. Call your doctor or health care professional if you are  unable to keep an appointment.  What may interact with this medicine?  Do not take this medicine with any of the following medications:  -other medicines containing denosumab  This medicine may also interact with the following medications:  -medicines that suppress the immune system  -medicines that treat cancer  -steroid medicines like prednisone or cortisone  This list may not describe all possible interactions. Give your health care provider a list of all the medicines, herbs, non-prescription drugs, or dietary supplements you use. Also tell them if you smoke, drink alcohol, or use illegal drugs. Some items may interact with your medicine.  What should I watch for while using this medicine?  Visit your doctor or health care professional for regular checks on your progress. Your doctor or health care professional may order blood tests and other tests to see how you are doing.  Call your doctor or health care professional if you get a cold or other infection while receiving this medicine. Do not treat yourself. This medicine may decrease your body's ability to fight infection.  You should make sure you get enough calcium and vitamin D while you are taking this medicine, unless your doctor tells you not to. Discuss the foods you eat and the vitamins you take with your health care professional.  See your dentist regularly. Brush and floss your teeth as directed. Before you have any dental work done, tell your dentist you are receiving this medicine.  Do   not become pregnant while taking this medicine or for 5 months after stopping it. Women should inform their doctor if they wish to become pregnant or think they might be pregnant. There is a potential for serious side effects to an unborn child. Talk to your health care professional or pharmacist for more information.  What side effects may I notice from receiving this medicine?  Side effects that you should report to your doctor or health care professional as soon as  possible:  -allergic reactions like skin rash, itching or hives, swelling of the face, lips, or tongue  -breathing problems  -chest pain  -fast, irregular heartbeat  -feeling faint or lightheaded, falls  -fever, chills, or any other sign of infection  -muscle spasms, tightening, or twitches  -numbness or tingling  -skin blisters or bumps, or is dry, peels, or red  -slow healing or unexplained pain in the mouth or jaw  -unusual bleeding or bruising  Side effects that usually do not require medical attention (Report these to your doctor or health care professional if they continue or are bothersome.):  -muscle pain  -stomach upset, gas  This list may not describe all possible side effects. Call your doctor for medical advice about side effects. You may report side effects to FDA at 1-800-FDA-1088.  Where should I keep my medicine?  This medicine is only given in a clinic, doctor's office, or other health care setting and will not be stored at home.  NOTE: This sheet is a summary. It may not cover all possible information. If you have questions about this medicine, talk to your doctor, pharmacist, or health care provider.      2016, [employer]vier/Gold Standard. (2012-04-02 12:37:47)

## 2016-05-31 NOTE — Telephone Encounter (Signed)
GAVE PATIENT AVS REPORT AND APPOINTMENTS FOR SEPTEMBER.  °

## 2016-05-31 NOTE — Progress Notes (Signed)
Hematology and Oncology Follow Up Visit  Adir Gunnett [id number] [birth year] 65 y.o. 05/31/2016 11:30 AM   Principle Diagnosis: 65 year old gentleman with prostate cancer presented with diffuse bony metastasis and lymphadenopathy. His PSA is 899 with unknown Gleason score. His diagnosis was confirmed in June 2016. 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   Prior Therapy:  He is status post a biopsy obtained of the soft tissue in the retroperitoneal area on 04/03/2015. He is status post whole brain radiation for a total of 30 gray in 10 fractions completed in April 2017.  Current therapy:  Lupron 22.5 mg IM every 3 months started in June 2016.  Xgeva to start on 07/24/2015.  Zytiga 1000 mg started in April 2017.  Interim History:  Mr. Lagreca presents today for a follow-up visit. Since the last visit, he continues to do well and show significant improvement in his health. He continues to live independently and attends activities of daily living. He does not report any residual neurological deficits. Her speech continues to improve.    He continues Zytiga without weight loss, nausea or dyspepsia. His lower extremity edema has resolved. His appetite remain excellent and his performance status remains reasonable. He has no difficulties obtaining this medication at this time. He denied any complications related to [facility] including arthralgias or myalgias. He does not have any dental complications.   He does not report any headaches, blurry vision, syncope or seizures. He does not report any fevers, chills, sweats, weight loss or appetite changes. He does not report any chest pain, palpitation, orthopnea or leg edema. He does not report any cough, shortness of breath, dyspnea on exertion, wheezing or hemoptysis. He does not report any nausea, vomiting, abdominal pain, hematochezia, or early satiety. He does not report any hematuria  or dysuria. He does report frequency, hesitancy and nocturia. He does not report any skeletal complaints of arthralgias or myalgias. Remaining review of systems unremarkable.  Medications: I have reviewed the patient's current medications.  Current Outpatient Prescriptions  Medication Sig Dispense Refill  . amLODipine (NORVASC) 10 MG tablet TAKE 1 TABLET BY MOUTH EVERY DAY 30 tablet 1  . Aspirin-Caffeine (BC FAST PAIN RELIEF PO) Take 1 Package by mouth as needed (pain). Reported on 03/21/2016    . calcitRIOL (ROCALTROL) 0.5 MCG capsule TAKE ONE CAPSULE BY MOUTH TWICE A DAY 60 capsule 0  . calcium carbonate (TUMS - DOSED IN MG ELEMENTAL CALCIUM) 500 MG chewable tablet Chew 5 tablets (1,000 mg of elemental calcium total) by mouth 3 (three) times daily. 90 tablet 0  . calcium-vitamin D 250-100 MG-UNIT tablet Take 1 tablet by mouth 2 (two) times daily. 180 tablet 3  . [facility] OYSTER SHELL CALCIUM+VIT D 500-125 MG-UNIT TABS TAKE 1 TABLET BY MOUTH TWICE A DAY 60 tablet 3  . dexamethasone (DECADRON) 2 MG tablet Take 1 tablet (2 mg total) by mouth daily. 30 tablet 0  . finasteride (PROSCAR) 5 MG tablet Take 1 tablet (5 mg total) by mouth daily. 90 tablet 1  . KLOR-CON M20 20 MEQ tablet Take 20 mEq by mouth 2 (two) times daily.  0  . losartan (COZAAR) 100 MG tablet Take 100 mg by mouth daily.  3  . omeprazole (PRILOSEC) 40 MG capsule TAKE 1 CAPSULE (40 MG TOTAL) BY MOUTH DAILY. 90 capsule 1  . tamsulosin (FLOMAX) 0.4 MG CAPS capsule TAKE 1 CAPSULE (0.4 MG TOTAL) BY MOUTH DAILY. 90 capsule 1  . Vitamin  D, Ergocalciferol, (DRISDOL) 50000 units CAPS capsule Take 1 capsule (50,000 Units total) by mouth every other day. 30 capsule 0  . ZYTIGA 250 MG tablet TAKE 4 TABLETS (1000 MG TOTAL) BY MOUTH ONCE DAILY. TAKE ON AN EMPTY STOMACH (AT LEAST 1 HR BEFORE OR 2 HR AFTER EATING) SWALLOW WHOLE. 120 tablet 0   No current facility-administered medications for this visit.      Allergies:  Allergies  Allergen Reactions   . Losartan Potassium-Hctz Other (See Comments)    dizziness    Past Medical History, Surgical history, Social history, and Family History were reviewed and updated.   Physical Exam: Blood pressure 119/67, pulse 85, temperature 98.5 F (36.9 C), temperature source Oral, resp. rate 18, height 5\' 11"  (1.803 m), weight 219 lb 12.8 oz (99.7 kg), SpO2 98 %. ECOG: 1 General appearance: Alert, awake gentleman without distress. Head: Normocephalic, without obvious abnormality no oral thrush noted. Neck: no adenopathy Lymph nodes: Cervical, supraclavicular, and axillary nodes normal. Heart:regular rate and rhythm, S1, S2 normal, no murmur, click, rub or gallop Lung:chest clear, no wheezing, rales, normal symmetric air entry Abdomin: soft, non-tender, without masses or organomegaly no rebound or guarding. EXT:no erythema, induration, or nodules Neurological examination: No deficits noted.  Lab Results: Lab Results  Component Value Date   WBC 8.0 05/31/2016   HGB 12.2 (L) 05/31/2016   HCT 37.7 (L) 05/31/2016   MCV 89.4 05/31/2016   PLT 213 05/31/2016     Chemistry      Component Value Date/Time   NA 139 04/29/2016 1213   K 4.4 04/29/2016 1213   CL 110 03/16/2016 0454   CO2 18 (L) 04/29/2016 1213   BUN 16.0 04/29/2016 1213   CREATININE 0.8 04/29/2016 1213      Component Value Date/Time   CALCIUM 8.0 (L) 04/29/2016 1213   ALKPHOS 1,162 (H) 04/29/2016 1213   AST 13 04/29/2016 1213   ALT 14 04/29/2016 1213   BILITOT 0.55 04/29/2016 1213       Results for TEJAS, HARIRI SR. (MRN [id number]) as of 05/31/2016 11:19  Ref. Range 03/23/2016 15:14 04/08/2016 11:23 04/29/2016 12:13  PSA Latest Ref Range: 0.0 - 4.0 ng/mL 104.9 (H) 70.0 (H) 46.7 (H)      Impression and Plan:  65 year old gentleman with the following issues:  1. Advanced prostate cancer presenting with diffuse bony metastasis and bulky lymphadenopathy and PSA of 899.8. This is biopsy proven to be prostate cancer.    He started in June 2016 but have developed castration-resistant disease with a PSA rise up to 702.5 As well as brain metastasis.  He is currently on Zytiga and have tolerated it well. He had an excellent response with dramatic improvement in his PSA down to 46 as well as drop in his alkaline phosphatase. His clinical status have also improved with improvement in his quality of life and performance status. He does not have any major issues with this medication the plan is to continue with the same dose and schedule.   2. Bone directed therapy: He received Xgeva without any major complications. He will receive this with every visit on a monthly basis.  3. Dural/epidural mass: He is status post whole brain radiation without any residual complications. His speech continues to improve without any other neurological deficits.  4. Pain: Is no longer an issue for him at this time and continues to ambulate without any difficulties.  5. Prognosis: He continues to have an incurable malignancy that have responded very well  to aggressive treatment. His cancer appears to be under reasonable control and the plan is to continue with aggressive measures.  6. Androgen deprivation: He will continue on Lupron 22.5 mg every 3 months and he will receive his next injection in October 2017.  7. Follow-up: Will be in 4 weeks to evaluate his progress as well as his response to Zytiga.     [facility], MD 8/15/201711:30 AM

## 2016-05-31 NOTE — Progress Notes (Signed)
No lupron today per Dr. [name], pt to get in October  Confirmed with patient he is taking his calcium

## 2016-06-01 ENCOUNTER — Other Ambulatory Visit: Payer: Self-pay | Admitting: Oncology

## 2016-06-01 DIAGNOSIS — C61 Malignant neoplasm of prostate: Secondary | ICD-10-CM

## 2016-06-01 DIAGNOSIS — C7951 Secondary malignant neoplasm of bone: Secondary | ICD-10-CM

## 2016-06-01 DIAGNOSIS — C7931 Secondary malignant neoplasm of brain: Secondary | ICD-10-CM

## 2016-06-01 LAB — PSA: Prostate Specific Ag, Serum: 30.2 ng/mL — ABNORMAL HIGH (ref 0.0–4.0)

## 2016-06-14 ENCOUNTER — Encounter: Payer: Self-pay | Admitting: *Deleted

## 2016-06-28 ENCOUNTER — Ambulatory Visit (HOSPITAL_BASED_OUTPATIENT_CLINIC_OR_DEPARTMENT_OTHER): Payer: Managed Care, Other (non HMO) | Admitting: Oncology

## 2016-06-28 ENCOUNTER — Other Ambulatory Visit (HOSPITAL_BASED_OUTPATIENT_CLINIC_OR_DEPARTMENT_OTHER): Payer: Managed Care, Other (non HMO)

## 2016-06-28 ENCOUNTER — Encounter: Payer: Self-pay | Admitting: Medical Oncology

## 2016-06-28 ENCOUNTER — Ambulatory Visit (HOSPITAL_BASED_OUTPATIENT_CLINIC_OR_DEPARTMENT_OTHER): Payer: Managed Care, Other (non HMO)

## 2016-06-28 ENCOUNTER — Telehealth: Payer: Self-pay | Admitting: Oncology

## 2016-06-28 VITALS — BP 119/69 | HR 67 | Temp 98.6°F | Resp 17 | Wt 218.6 lb

## 2016-06-28 DIAGNOSIS — C7951 Secondary malignant neoplasm of bone: Secondary | ICD-10-CM | POA: Diagnosis not present

## 2016-06-28 DIAGNOSIS — C7931 Secondary malignant neoplasm of brain: Secondary | ICD-10-CM

## 2016-06-28 DIAGNOSIS — E291 Testicular hypofunction: Secondary | ICD-10-CM | POA: Diagnosis not present

## 2016-06-28 DIAGNOSIS — C61 Malignant neoplasm of prostate: Secondary | ICD-10-CM | POA: Diagnosis not present

## 2016-06-28 LAB — COMPREHENSIVE METABOLIC PANEL
ALBUMIN: 3.3 g/dL — AB (ref 3.5–5.0)
ALT: 23 U/L (ref 0–55)
ANION GAP: 8 meq/L (ref 3–11)
AST: 16 U/L (ref 5–34)
Alkaline Phosphatase: 475 U/L — ABNORMAL HIGH (ref 40–150)
BILIRUBIN TOTAL: 0.48 mg/dL (ref 0.20–1.20)
BUN: 11.1 mg/dL (ref 7.0–26.0)
CALCIUM: 8.3 mg/dL — AB (ref 8.4–10.4)
CO2: 22 mEq/L (ref 22–29)
CREATININE: 0.8 mg/dL (ref 0.7–1.3)
Chloride: 112 mEq/L — ABNORMAL HIGH (ref 98–109)
EGFR: >90 mL/min/{1.73_m2} (ref >90)
Glucose: 109 mg/dl (ref 70–140)
Potassium: 4.3 mEq/L (ref 3.5–5.1)
Sodium: 142 mEq/L (ref 136–145)
TOTAL PROTEIN: 7.2 g/dL (ref 6.4–8.3)

## 2016-06-28 LAB — CBC WITH DIFFERENTIAL/PLATELET
BASO%: 0.5 % (ref 0.0–2.0)
Basophils Absolute: 0 10*3/uL (ref 0.0–0.1)
EOS ABS: 0 10*3/uL (ref 0.0–0.5)
EOS%: 0.3 % (ref 0.0–7.0)
HCT: 37.9 % — ABNORMAL LOW (ref 38.4–49.9)
HEMOGLOBIN: 12.5 g/dL — AB (ref 13.0–17.1)
LYMPH%: 15 % (ref 14.0–49.0)
MCH: 29.2 pg (ref 27.2–33.4)
MCHC: 32.9 g/dL (ref 32.0–36.0)
MCV: 88.6 fL (ref 79.3–98.0)
MONO#: 0.7 10*3/uL (ref 0.1–0.9)
MONO%: 7.8 % (ref 0.0–14.0)
NEUT%: 76.4 % — ABNORMAL HIGH (ref 39.0–75.0)
NEUTROS ABS: 6.5 10*3/uL (ref 1.5–6.5)
PLATELETS: 207 10*3/uL (ref 140–400)
RBC: 4.28 10*6/uL (ref 4.20–5.82)
RDW: 15.2 % — ABNORMAL HIGH (ref 11.0–14.6)
WBC: 8.5 10*3/uL (ref 4.0–10.3)
lymph#: 1.3 10*3/uL (ref 0.9–3.3)

## 2016-06-28 MED ORDER — DENOSUMAB 120 MG/1.7ML ~~LOC~~ SOLN
120.0000 mg | Freq: Once | SUBCUTANEOUS | Status: AC
  Administered 2016-06-28: 120 mg via SUBCUTANEOUS
  Filled 2016-06-28: qty 1.7

## 2016-06-28 NOTE — Telephone Encounter (Signed)
Avs report and appointment schd given per 06/28/16 los. °

## 2016-06-28 NOTE — Progress Notes (Signed)
Hematology and Oncology Follow Up Visit  Steven Church [id number] 1951-03-21 65 y.o. 06/28/2016 3:30 PM   Principle Diagnosis: 65 year old gentleman with prostate cancer presented with diffuse bony metastasis and lymphadenopathy. His PSA is 899 with unknown Gleason score. His diagnosis was confirmed in June 2016. 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   Prior Therapy:  He is status post a biopsy obtained of the soft tissue in the retroperitoneal area on 04/03/2015. He is status post whole brain radiation for a total of 30 gray in 10 fractions completed in April 2017.  Current therapy:  Lupron 22.5 mg IM every 3 months started in June 2016.  Xgeva to start on 07/24/2015.  Zytiga 1000 mg started in April 2017.  Interim History:  Steven Church presents today for a follow-up visit. Since the last visit, he reports no major changes in his health. He continues to live independently and attends activities of daily living. He does not report any residual neurological deficits. His appetite remain excellent without any weight loss or appetite changes. He continues Zytiga without weight loss, nausea or dyspepsia.    He denied any complications related to Steven Church including arthralgias or myalgias. He does not have any dental complications.    He does not report any headaches, blurry vision, syncope or seizures. He does not report any fevers, chills, sweats, weight loss or appetite changes. He does not report any chest pain, palpitation, orthopnea or leg edema. He does not report any cough, shortness of breath, dyspnea on exertion, wheezing or hemoptysis. He does not report any nausea, vomiting, abdominal pain, hematochezia, or early satiety. He does not report any hematuria or dysuria. He does report frequency, hesitancy and nocturia. He does not report any skeletal complaints of arthralgias or myalgias. Remaining review of systems  unremarkable.  Medications: I have reviewed the patient's current medications.  Current Outpatient Prescriptions  Medication Sig Dispense Refill  . amLODipine (NORVASC) 10 MG tablet TAKE 1 TABLET BY MOUTH EVERY DAY 30 tablet 1  . Aspirin-Caffeine (BC FAST PAIN RELIEF PO) Take 1 Package by mouth as needed (pain). Reported on 03/21/2016    . calcitRIOL (ROCALTROL) 0.5 MCG capsule TAKE ONE CAPSULE BY MOUTH TWICE A DAY 60 capsule 0  . calcium carbonate (TUMS - DOSED IN MG ELEMENTAL CALCIUM) 500 MG chewable tablet Chew 5 tablets (1,000 mg of elemental calcium total) by mouth 3 (three) times daily. 90 tablet 0  . [facility] OYSTER SHELL CALCIUM+VIT D 500-125 MG-UNIT TABS TAKE 1 TABLET BY MOUTH TWICE A DAY 60 tablet 3  . dexamethasone (DECADRON) 2 MG tablet Take 1 tablet (2 mg total) by mouth daily. 30 tablet 0  . finasteride (PROSCAR) 5 MG tablet Take 1 tablet (5 mg total) by mouth daily. 90 tablet 1  . KLOR-CON M20 20 MEQ tablet Take 20 mEq by mouth 2 (two) times daily.  0  . losartan (COZAAR) 100 MG tablet Take 100 mg by mouth daily.  3  . omeprazole (PRILOSEC) 40 MG capsule TAKE 1 CAPSULE (40 MG TOTAL) BY MOUTH DAILY. 90 capsule 1  . tamsulosin (FLOMAX) 0.4 MG CAPS capsule TAKE 1 CAPSULE (0.4 MG TOTAL) BY MOUTH DAILY. 90 capsule 1  . Vitamin D, Ergocalciferol, (DRISDOL) 50000 units CAPS capsule Take 1 capsule (50,000 Units total) by mouth every other day. 30 capsule 0  . ZYTIGA 250 MG tablet TAKE 4 TABLETS (1000 MG TOTAL) BY MOUTH ONCE DAILY. TAKE ON AN EMPTY STOMACH (  AT LEAST 1 HR BEFORE OR 2 HR AFTER EATING) SWALLOW WHOLE. 120 tablet 0   No current facility-administered medications for this visit.      Allergies:  Allergies  Allergen Reactions  . Losartan Potassium-Hctz Other (See Comments)    dizziness    Past Medical History, Surgical history, Social history, and Family History were reviewed and updated.   Physical Exam: Blood pressure 119/69, pulse 67, temperature 98.6 F (37 C),  temperature source Oral, resp. rate 17, weight 218 lb 9.6 oz (99.2 kg), SpO2 100 %. ECOG: 1 General appearance: Well-appearing gentleman without distress. Head: Normocephalic, without obvious abnormality no oral ulcers or lesions. Neck: no adenopathy Lymph nodes: Cervical, supraclavicular, and axillary nodes normal. Heart:regular rate and rhythm, S1, S2 normal, no murmur, click, rub or gallop Lung:chest clear, no wheezing, rales, normal symmetric air entry Abdomin: soft, non-tender, without masses or organomegaly no shifting dullness or ascites. EXT:no erythema, induration, or nodules Neurological examination: No deficits noted.  Lab Results: Lab Results  Component Value Date   WBC 8.5 06/28/2016   HGB 12.5 (L) 06/28/2016   HCT 37.9 (L) 06/28/2016   MCV 88.6 06/28/2016   PLT 207 06/28/2016     Chemistry      Component Value Date/Time   NA 141 05/31/2016 1104   K 3.8 05/31/2016 1104   CL 110 03/16/2016 0454   CO2 20 (L) 05/31/2016 1104   BUN 9.8 05/31/2016 1104   CREATININE 0.8 05/31/2016 1104      Component Value Date/Time   CALCIUM 7.8 (L) 05/31/2016 1104   ALKPHOS 618 (H) 05/31/2016 1104   AST 32 05/31/2016 1104   ALT 60 (H) 05/31/2016 1104   BILITOT 0.67 05/31/2016 1104        Results for Steven Church, Steven SR. (MRN [id number]) as of 06/28/2016 15:23  Ref. Range 04/08/2016 11:23 04/29/2016 12:13 05/31/2016 11:04  PSA Latest Ref Range: 0.0 - 4.0 ng/mL 70.0 (H) 46.7 (H) 30.2 (H)      Impression and Plan:  65 year old gentleman with the following issues:  1. Advanced prostate cancer presenting with diffuse bony metastasis and bulky lymphadenopathy and PSA of 899.8. This is biopsy proven to be prostate cancer.   He started in June 2016 but have developed castration-resistant disease with a PSA rise up to 702.5 As well as brain metastasis.  He is currently on Zytiga and have tolerated it well. He continues to have excellent PSA and clinical response. PSA down to 30  and his quality of life and symptoms continues to improve. The plan is to continue with the same dose and schedule.   2. Bone directed therapy: He received Xgeva without any major complications. He will receive this with every visit on a monthly basis.  3. Dural/epidural mass: He is status post whole brain radiation without any residual complications. No relapse of his symptoms or neurological deficits.  4. Pain: He does not report any pain issues at this time.  5. Prognosis: He does have incurable malignancy that has been palliated effectively at this time. The plan is to continue with the same approach.  6. Androgen deprivation: He will continue on Lupron 22.5 mg every 3 months and he will receive his next injection in October 2017.  7. Follow-up: Will be in 4 weeks to evaluate his progress as well as his response to Zytiga.     Zola Button, MD 9/12/20173:30 PM

## 2016-06-28 NOTE — Patient Instructions (Signed)
Denosumab injection  What is this medicine?  DENOSUMAB (den oh sue mab) slows bone breakdown. Prolia is used to treat osteoporosis in women after menopause and in men. Xgeva is used to prevent bone fractures and other bone problems caused by cancer bone metastases. Xgeva is also used to treat giant cell tumor of the bone.  This medicine may be used for other purposes; ask your health care provider or pharmacist if you have questions.  What should I tell my health care provider before I take this medicine?  They need to know if you have any of these conditions:  -dental disease  -eczema  -infection or history of infections  -kidney disease or on dialysis  -low blood calcium or vitamin D  -malabsorption syndrome  -scheduled to have surgery or tooth extraction  -taking medicine that contains denosumab  -thyroid or parathyroid disease  -an unusual reaction to denosumab, other medicines, foods, dyes, or preservatives  -pregnant or trying to get pregnant  -breast-feeding  How should I use this medicine?  This medicine is for injection under the skin. It is given by a health care professional in a hospital or clinic setting.  If you are getting Prolia, a special MedGuide will be given to you by the pharmacist with each prescription and refill. Be sure to read this information carefully each time.  For Prolia, talk to your pediatrician regarding the use of this medicine in children. Special care may be needed. For Xgeva, talk to your pediatrician regarding the use of this medicine in children. While this drug may be prescribed for children as young as 13 years for selected conditions, precautions do apply.  Overdosage: If you think you have taken too much of this medicine contact a poison control center or emergency room at once.  NOTE: This medicine is only for you. Do not share this medicine with others.  What if I miss a dose?  It is important not to miss your dose. Call your doctor or health care professional if you are  unable to keep an appointment.  What may interact with this medicine?  Do not take this medicine with any of the following medications:  -other medicines containing denosumab  This medicine may also interact with the following medications:  -medicines that suppress the immune system  -medicines that treat cancer  -steroid medicines like prednisone or cortisone  This list may not describe all possible interactions. Give your health care provider a list of all the medicines, herbs, non-prescription drugs, or dietary supplements you use. Also tell them if you smoke, drink alcohol, or use illegal drugs. Some items may interact with your medicine.  What should I watch for while using this medicine?  Visit your doctor or health care professional for regular checks on your progress. Your doctor or health care professional may order blood tests and other tests to see how you are doing.  Call your doctor or health care professional if you get a cold or other infection while receiving this medicine. Do not treat yourself. This medicine may decrease your body's ability to fight infection.  You should make sure you get enough calcium and vitamin D while you are taking this medicine, unless your doctor tells you not to. Discuss the foods you eat and the vitamins you take with your health care professional.  See your dentist regularly. Brush and floss your teeth as directed. Before you have any dental work done, tell your dentist you are receiving this medicine.  Do   not become pregnant while taking this medicine or for 5 months after stopping it. Women should inform their doctor if they wish to become pregnant or think they might be pregnant. There is a potential for serious side effects to an unborn child. Talk to your health care professional or pharmacist for more information.  What side effects may I notice from receiving this medicine?  Side effects that you should report to your doctor or health care professional as soon as  possible:  -allergic reactions like skin rash, itching or hives, swelling of the face, lips, or tongue  -breathing problems  -chest pain  -fast, irregular heartbeat  -feeling faint or lightheaded, falls  -fever, chills, or any other sign of infection  -muscle spasms, tightening, or twitches  -numbness or tingling  -skin blisters or bumps, or is dry, peels, or red  -slow healing or unexplained pain in the mouth or jaw  -unusual bleeding or bruising  Side effects that usually do not require medical attention (Report these to your doctor or health care professional if they continue or are bothersome.):  -muscle pain  -stomach upset, gas  This list may not describe all possible side effects. Call your doctor for medical advice about side effects. You may report side effects to FDA at 1-800-FDA-1088.  Where should I keep my medicine?  This medicine is only given in a clinic, doctor's office, or other health care setting and will not be stored at home.  NOTE: This sheet is a summary. It may not cover all possible information. If you have questions about this medicine, talk to your doctor, pharmacist, or health care provider.      2016, [employer]vier/Gold Standard. (2012-04-02 12:37:47)

## 2016-06-29 LAB — PSA: Prostate Specific Ag, Serum: 34.4 ng/mL — ABNORMAL HIGH (ref 0.0–4.0)

## 2016-06-30 NOTE — Progress Notes (Signed)
Steven Church saw Dr. [name] today and he is very pleased with his response to [facility]. No real concerns or issues today. I will continue to follow and asked him to call me with any questions or concerns. He voiced understanding.

## 2016-07-18 ENCOUNTER — Other Ambulatory Visit: Payer: Self-pay | Admitting: Oncology

## 2016-07-18 DIAGNOSIS — C7951 Secondary malignant neoplasm of bone: Secondary | ICD-10-CM

## 2016-07-18 DIAGNOSIS — C61 Malignant neoplasm of prostate: Secondary | ICD-10-CM

## 2016-07-18 DIAGNOSIS — C7931 Secondary malignant neoplasm of brain: Secondary | ICD-10-CM

## 2016-07-21 ENCOUNTER — Other Ambulatory Visit: Payer: Self-pay | Admitting: *Deleted

## 2016-07-21 DIAGNOSIS — C61 Malignant neoplasm of prostate: Secondary | ICD-10-CM

## 2016-07-21 DIAGNOSIS — C7931 Secondary malignant neoplasm of brain: Secondary | ICD-10-CM

## 2016-07-21 DIAGNOSIS — C7951 Secondary malignant neoplasm of bone: Secondary | ICD-10-CM

## 2016-07-21 MED ORDER — ABIRATERONE ACETATE 250 MG PO TABS: ORAL_TABLET | ORAL | 0 refills | Status: DC

## 2016-07-29 ENCOUNTER — Telehealth: Payer: Self-pay | Admitting: Oncology

## 2016-07-29 ENCOUNTER — Ambulatory Visit (HOSPITAL_BASED_OUTPATIENT_CLINIC_OR_DEPARTMENT_OTHER): Payer: Managed Care, Other (non HMO) | Admitting: Oncology

## 2016-07-29 ENCOUNTER — Other Ambulatory Visit (HOSPITAL_BASED_OUTPATIENT_CLINIC_OR_DEPARTMENT_OTHER): Payer: Managed Care, Other (non HMO)

## 2016-07-29 ENCOUNTER — Ambulatory Visit (HOSPITAL_BASED_OUTPATIENT_CLINIC_OR_DEPARTMENT_OTHER): Payer: Managed Care, Other (non HMO)

## 2016-07-29 VITALS — BP 115/75 | HR 72 | Temp 97.7°F | Resp 18 | Ht 71.0 in | Wt 215.9 lb

## 2016-07-29 DIAGNOSIS — E291 Testicular hypofunction: Secondary | ICD-10-CM | POA: Diagnosis not present

## 2016-07-29 DIAGNOSIS — C61 Malignant neoplasm of prostate: Secondary | ICD-10-CM

## 2016-07-29 DIAGNOSIS — C7931 Secondary malignant neoplasm of brain: Secondary | ICD-10-CM

## 2016-07-29 DIAGNOSIS — C7951 Secondary malignant neoplasm of bone: Secondary | ICD-10-CM

## 2016-07-29 DIAGNOSIS — Z5111 Encounter for antineoplastic chemotherapy: Secondary | ICD-10-CM | POA: Diagnosis not present

## 2016-07-29 LAB — CBC WITH DIFFERENTIAL/PLATELET
BASO%: 0.3 % (ref 0.0–2.0)
Basophils Absolute: 0 10*3/uL (ref 0.0–0.1)
EOS ABS: 0.1 10*3/uL (ref 0.0–0.5)
EOS%: 1.4 % (ref 0.0–7.0)
HEMATOCRIT: 38.1 % — AB (ref 38.4–49.9)
HEMOGLOBIN: 12.5 g/dL — AB (ref 13.0–17.1)
LYMPH#: 1.1 10*3/uL (ref 0.9–3.3)
LYMPH%: 16 % (ref 14.0–49.0)
MCH: 28.7 pg (ref 27.2–33.4)
MCHC: 32.9 g/dL (ref 32.0–36.0)
MCV: 87.4 fL (ref 79.3–98.0)
MONO#: 0.4 10*3/uL (ref 0.1–0.9)
MONO%: 6.2 % (ref 0.0–14.0)
NEUT%: 76.1 % — ABNORMAL HIGH (ref 39.0–75.0)
NEUTROS ABS: 5.2 10*3/uL (ref 1.5–6.5)
PLATELETS: 211 10*3/uL (ref 140–400)
RBC: 4.36 10*6/uL (ref 4.20–5.82)
RDW: 14.4 % (ref 11.0–14.6)
WBC: 6.8 10*3/uL (ref 4.0–10.3)

## 2016-07-29 LAB — COMPREHENSIVE METABOLIC PANEL
ALBUMIN: 3.2 g/dL — AB (ref 3.5–5.0)
ALK PHOS: 386 U/L — AB (ref 40–150)
ALT: 20 U/L (ref 0–55)
ANION GAP: 8 meq/L (ref 3–11)
AST: 21 U/L (ref 5–34)
BILIRUBIN TOTAL: 0.46 mg/dL (ref 0.20–1.20)
BUN: 8.8 mg/dL (ref 7.0–26.0)
CO2: 24 meq/L (ref 22–29)
CREATININE: 0.8 mg/dL (ref 0.7–1.3)
Calcium: 8.1 mg/dL — ABNORMAL LOW (ref 8.4–10.4)
Chloride: 112 mEq/L — ABNORMAL HIGH (ref 98–109)
Glucose: 100 mg/dl (ref 70–140)
Potassium: 4 mEq/L (ref 3.5–5.1)
Sodium: 144 mEq/L (ref 136–145)
TOTAL PROTEIN: 7.1 g/dL (ref 6.4–8.3)

## 2016-07-29 MED ORDER — LEUPROLIDE ACETATE (4 MONTH) 30 MG IM KIT
30.0000 mg | PACK | Freq: Once | INTRAMUSCULAR | Status: AC
Start: 2016-07-29 — End: 2016-07-29
  Administered 2016-07-29: 30 mg via INTRAMUSCULAR
  Filled 2016-07-29: qty 30

## 2016-07-29 MED ORDER — DENOSUMAB 120 MG/1.7ML ~~LOC~~ SOLN
120.0000 mg | Freq: Once | SUBCUTANEOUS | Status: AC
Start: 2016-07-29 — End: 2016-07-29
  Administered 2016-07-29: 120 mg via SUBCUTANEOUS
  Filled 2016-07-29: qty 1.7

## 2016-07-29 NOTE — Progress Notes (Signed)
Hematology and Oncology Follow Up Visit  Steven Church [id number] 01-Dec-1950 65 y.o. 07/29/2016 11:50 AM   Principle Diagnosis: 65 year old gentleman with prostate cancer presented with diffuse bony metastasis and lymphadenopathy. His PSA is 899 with unknown Gleason score. His diagnosis was confirmed in June 2016. 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   Prior Therapy:  He is status post a biopsy obtained of the soft tissue in the retroperitoneal area on 04/03/2015. He is status post whole brain radiation for a total of 30 gray in 10 fractions completed in April 2017.  Current therapy:  Lupron 22.5 mg IM every 3 months started in June 2016.  Xgeva to start on 07/24/2015.  Zytiga 1000 mg started in April 2017.  Interim History:  Steven Church presents today for a follow-up visit. Since the last visit, he continues improvement in his overall health. He denied any neurological symptoms during headaches or syncope. His appetite continues to improve and his weight is stable. He continues Zytiga without weight loss, nausea or dyspepsia. He does not report any lower extremity edema or excessive fatigue. He continues to attend activities of daily living including driving short distances. He continues to live independently but his sister checks on him periodically. He denied any complications related to [facility] including arthralgias or myalgias. He does not have any dental complications.    He does not report any headaches, blurry vision, syncope or seizures. He does not report any fevers, chills, sweats, weight loss or appetite changes. He does not report any chest pain, palpitation, orthopnea or leg edema. He does not report any cough, shortness of breath, dyspnea on exertion, wheezing or hemoptysis. He does not report any nausea, vomiting, abdominal pain, hematochezia, or early satiety. He does not report any hematuria or dysuria.  He does report frequency, hesitancy and nocturia. He does not report any skeletal complaints of arthralgias or myalgias. Remaining review of systems unremarkable.  Medications: I have reviewed the patient's current medications.  Current Outpatient Prescriptions  Medication Sig Dispense Refill  . abiraterone Acetate (ZYTIGA) 250 MG tablet TAKE 4 TABLETS (1000 MG TOTAL) BY MOUTH ONCE DAILY. TAKE ON AN EMPTY STOMACH (AT LEAST 1 HR BEFORE OR 2 HR AFTER EATING) SWALLOW WHOLE. 120 tablet 0  . amLODipine (NORVASC) 10 MG tablet TAKE 1 TABLET BY MOUTH EVERY DAY 30 tablet 1  . Aspirin-Caffeine (BC FAST PAIN RELIEF PO) Take 1 Package by mouth as needed (pain). Reported on 03/21/2016    . calcitRIOL (ROCALTROL) 0.5 MCG capsule TAKE ONE CAPSULE BY MOUTH TWICE A DAY 60 capsule 0  . calcium carbonate (TUMS - DOSED IN MG ELEMENTAL CALCIUM) 500 MG chewable tablet Chew 5 tablets (1,000 mg of elemental calcium total) by mouth 3 (three) times daily. 90 tablet 0  . [facility] OYSTER SHELL CALCIUM+VIT D 500-125 MG-UNIT TABS TAKE 1 TABLET BY MOUTH TWICE A DAY 60 tablet 3  . dexamethasone (DECADRON) 2 MG tablet Take 1 tablet (2 mg total) by mouth daily. 30 tablet 0  . finasteride (PROSCAR) 5 MG tablet Take 1 tablet (5 mg total) by mouth daily. 90 tablet 1  . KLOR-CON M20 20 MEQ tablet Take 20 mEq by mouth 2 (two) times daily.  0  . losartan (COZAAR) 100 MG tablet Take 100 mg by mouth daily.  3  . omeprazole (PRILOSEC) 40 MG capsule TAKE 1 CAPSULE (40 MG TOTAL) BY MOUTH DAILY. 90 capsule 1  . tamsulosin (FLOMAX) 0.4 MG CAPS  capsule TAKE 1 CAPSULE (0.4 MG TOTAL) BY MOUTH DAILY. 90 capsule 1  . Vitamin D, Ergocalciferol, (DRISDOL) 50000 units CAPS capsule Take 1 capsule (50,000 Units total) by mouth every other day. 30 capsule 0   No current facility-administered medications for this visit.      Allergies:  Allergies  Allergen Reactions  . Losartan Potassium-Hctz Other (See Comments)    dizziness    Past Medical History,  Surgical history, Social history, and Family History were reviewed and updated.   Physical Exam: Blood pressure 115/75, pulse 72, temperature 97.7 F (36.5 C), temperature source Oral, resp. rate 18, height 5\' 11"  (1.803 m), weight 215 lb 14.4 oz (97.9 kg), SpO2 99 %. ECOG: 1 General appearance: Alert, awake gentleman appeared without distress. Head: Normocephalic, without obvious abnormality no oral ulcers or lesions. Neck: no adenopathy thyroid masses. Lymph nodes: Cervical, supraclavicular, and axillary nodes normal. Heart:regular rate and rhythm, S1, S2 normal, no murmur, click, rub or gallop Lung:chest clear, no wheezing, rales, normal symmetric air entry Abdomin: soft, non-tender, without masses or organomegaly no shifting dullness or ascites. EXT:no erythema, induration, or clubbing.  Neurological examination: No deficits noted.  Lab Results: Lab Results  Component Value Date   WBC 6.8 07/29/2016   HGB 12.5 (L) 07/29/2016   HCT 38.1 (L) 07/29/2016   MCV 87.4 07/29/2016   PLT 211 07/29/2016     Chemistry      Component Value Date/Time   NA 142 06/28/2016 1458   K 4.3 06/28/2016 1458   CL 110 03/16/2016 0454   CO2 22 06/28/2016 1458   BUN 11.1 06/28/2016 1458   CREATININE 0.8 06/28/2016 1458      Component Value Date/Time   CALCIUM 8.3 (L) 06/28/2016 1458   ALKPHOS 475 (H) 06/28/2016 1458   AST 16 06/28/2016 1458   ALT 23 06/28/2016 1458   BILITOT 0.48 06/28/2016 1458          Results for Steven, KILDAY Church. (MRN [id number]) as of 07/29/2016 11:39  Ref. Range 01/22/2016 11:15 02/19/2016 12:31 03/04/2016 11:25 03/23/2016 15:14 04/08/2016 11:23 04/29/2016 12:13 05/31/2016 11:04 06/28/2016 14:58  PSA Latest Ref Range: 0.0 - 4.0 ng/mL 702.5 (H) 152.7 (H) 144.1 (H) 104.9 (H) 70.0 (H) 46.7 (H) 30.2 (H) 34.4 (H)     Impression and Plan:  65 year old gentleman with the following issues:  1. Advanced prostate cancer presenting with diffuse bony metastasis and bulky  lymphadenopathy and PSA of 899.8. This is biopsy proven to be prostate cancer.   He started in June 2016 but have developed castration-resistant disease with a PSA rise up to 702.5 As well as brain metastasis.  He is currently on Zytiga and have tolerated it well. His PSA remains under excellent control after dramatic drop from 702 30 most recently was 34. The plan is to continue with the same dose and schedule unless he develops symptomatic progression. The for salvage therapy will be utilized for the time and likely will require systemic chemotherapy.   2. Bone directed therapy: He received Xgeva without any major complications. He will receive this with every visit on a monthly basis.  3. Dural/epidural mass: He is status post whole brain radiation without any residual complications. He does not report any new neurological deficits or symptoms.  4. Pain: His pain has resolved.  5. Prognosis: He does have incurable malignancy that has been palliated effectively at this time. His performance status remains excellent and recommend continuing aggressive treatment.  6. Androgen deprivation: He will  continue on Lupron 22.5 mg every 3 months and he will receive his next injection in October 2017.  7. Follow-up: Will be in 4 weeks to evaluate his progress as well as his response to Zytiga.     Landrum Carbonell, MD 10/13/201711:50 AM

## 2016-07-29 NOTE — Telephone Encounter (Signed)
Avs report and appointment schedule given to patient, per 07/29/16 los. °

## 2016-07-30 LAB — PSA: PROSTATE SPECIFIC AG, SERUM: 39.8 ng/mL — AB (ref 0.0–4.0)

## 2016-08-02 ENCOUNTER — Other Ambulatory Visit: Payer: Self-pay | Admitting: Medical

## 2016-08-04 ENCOUNTER — Other Ambulatory Visit: Payer: Self-pay | Admitting: Medical

## 2016-08-04 ENCOUNTER — Other Ambulatory Visit: Payer: Self-pay | Admitting: *Deleted

## 2016-08-04 ENCOUNTER — Telehealth: Payer: Self-pay | Admitting: Medical

## 2016-08-04 MED ORDER — TAMSULOSIN HCL 0.4 MG PO CAPS: ORAL_CAPSULE | ORAL | 2 refills | Status: DC

## 2016-08-04 NOTE — Telephone Encounter (Signed)
pls send in 90 day or 30 day supply, whichever he wants

## 2016-08-04 NOTE — Telephone Encounter (Signed)
Pt misplaced his Flomax medication and only has one pill. Requesting that a new script be sent to the pharmacy so that he can get a refill on the med. Pt said he has spoken to the pharmacy and understands that he may have to pay out of pocket for the meds.

## 2016-08-04 NOTE — Telephone Encounter (Signed)
Done for #30 with 2 refills per patient request to [facility].

## 2016-08-08 ENCOUNTER — Other Ambulatory Visit: Payer: Self-pay | Admitting: Medical

## 2016-08-10 ENCOUNTER — Telehealth: Payer: Self-pay | Admitting: Radiation Oncology

## 2016-08-10 NOTE — Telephone Encounter (Signed)
Spoke with Ala Dach, Oceanographer, for [employer]. Then, I spoke with [facility] @ [address]. Learned that the patient has been terminated and exhausted his short and long term disability. Additionally, learned his [place] insurance will expire 08/16/16. Polo Riley, LCSW, has agreed to meet with the patient tomorrow at 1100 to help with open enrollment for Medicare part B. Phoned patient and explained all these findings. Patient confirms he will be present for 1100 appt tomorrow with Lauren.

## 2016-08-15 ENCOUNTER — Encounter: Payer: Self-pay | Admitting: *Deleted

## 2016-08-15 NOTE — Progress Notes (Signed)
[place] Work  Clinical Social Work was referred by Secretary/administrator for questions regarding [employer].  CSW met with patient in [place] office.  Steven Church reported he has Medicare Part A, but will be losing her insurance through his employer because he no longer qualifies for short-term disability.    CSW and patient discussed Medicare options in detail and reviewed the [facility] Department of [employer] Program thoroughly.    CSW and patient contact [facility] and provided information on patient's current medication list- they recommended a [employer] Part D plan.  Patient signed up for plan and understands he will receive a bill in the mail ($17/month) for Part D effect 08/17/16.  CSW and patient contacted [place] regarding signing up for [place] had patient fill out necessary paperwork and requested patient's employer complete necessary paperwork.  CSW awaiting paperwork from employer and then will mail in to social security administration.  Patient's Part B will cost $143/month.  CSW wrote out information regarding actions taken during visit and provided to patient for reference.    Steven Church, MSW, LCSW, OSW-C Clinical Social Worker [facility] [phone]

## 2016-08-18 ENCOUNTER — Telehealth: Payer: Self-pay | Admitting: Radiation Oncology

## 2016-08-18 NOTE — Telephone Encounter (Signed)
Received fax request from patient's pharmacy requesting refill of decadron. Phoned patient out of concern since he should have tapered off decadron at this point. Patient confirms he is taking decadron 1 mg once daily. Per Shona Simpson, PA-C instructed the patient decadron 2 mg [facility] and to follow the taper below. Patient confirmed understanding by reading back the following.   Decadron 1 mg once daily 08/18/16 until 08/24/16 then, 08/25/16 do not take decadron at all then, 08/26/16 take decadron 1 mg once this day then, 08/27/16 do not take decadron at all then, 08/28/16 take decadron 1 mg once this day then, 08/29/16 do not take decadron at all then, 08/30/16 take decadron 1 mg once this day then, stop. Reinforced that the taper is 1 mg daily for 5 days then, 1 mg every other day for 1 week then, stop.   Patient understands to contact this RN with complications or concerns.

## 2016-08-19 ENCOUNTER — Other Ambulatory Visit: Payer: Self-pay | Admitting: Oncology

## 2016-08-19 DIAGNOSIS — C61 Malignant neoplasm of prostate: Secondary | ICD-10-CM

## 2016-08-19 DIAGNOSIS — C7951 Secondary malignant neoplasm of bone: Secondary | ICD-10-CM

## 2016-08-19 DIAGNOSIS — C7931 Secondary malignant neoplasm of brain: Secondary | ICD-10-CM

## 2016-08-22 ENCOUNTER — Telehealth: Payer: Self-pay | Admitting: Radiation Oncology

## 2016-08-22 NOTE — Telephone Encounter (Signed)
Returned message left by patient. Patient reports he received a few letters in the mail and request this RN decipher them for him. Instructed patient to present to rad onc nursing station at 0930 today.

## 2016-08-22 NOTE — Telephone Encounter (Signed)
As ordered by Shona Simpson, PA-C called in Decadron 2 mg, quantity 30 and [facility] refills [facility] [address]

## 2016-08-22 NOTE — Telephone Encounter (Signed)
[facility]oned [facility] [address]and spoke with [place]. Alice reports the patient's decadron is ready for pick up and will cost him $26.99. Will inform patient of these findings.

## 2016-08-23 ENCOUNTER — Telehealth: Payer: Self-pay | Admitting: *Deleted

## 2016-08-23 NOTE — Telephone Encounter (Signed)
Spoke with lauren, will leave current appt for 09/02/16 just in case his medicaid is approved.

## 2016-08-23 NOTE — Telephone Encounter (Signed)
Yes

## 2016-08-23 NOTE — Telephone Encounter (Signed)
pateint does not have insurance and lauren [employer], Education officer, museum is assisting patient to obtain medicaid. It will probably take a month to be approved. He is scheduled for lab/Dr/xgeva on  09/02/16. He will not have coverage for this appt, okay to move appt out until he acquires medicaid to pay for injection?

## 2016-08-29 ENCOUNTER — Other Ambulatory Visit: Payer: Self-pay | Admitting: *Deleted

## 2016-08-29 ENCOUNTER — Telehealth: Payer: Self-pay | Admitting: *Deleted

## 2016-08-29 NOTE — Telephone Encounter (Signed)
Message received from [place] at [place] requesting information regarding patient's treatment plan.

## 2016-08-29 NOTE — Telephone Encounter (Signed)
Call placed back to [facility] at [facility] regarding patient's current treatment plan.  Marya Amsler appreciative of call back.

## 2016-09-02 ENCOUNTER — Telehealth: Payer: Self-pay | Admitting: Pharmacist

## 2016-09-02 ENCOUNTER — Other Ambulatory Visit: Payer: Managed Care, Other (non HMO)

## 2016-09-02 ENCOUNTER — Encounter: Payer: Self-pay | Admitting: *Deleted

## 2016-09-02 ENCOUNTER — Ambulatory Visit: Payer: Managed Care, Other (non HMO) | Admitting: Oncology

## 2016-09-02 ENCOUNTER — Ambulatory Visit: Payer: Managed Care, Other (non HMO)

## 2016-09-02 NOTE — Telephone Encounter (Signed)
Oral Chemotherapy Pharmacist Encounter  Received notification from Polo Riley, social worker, that patient had reached out about needing assistance to pay for Zytiga as he currently does not have any prescription insurance coverage. We were informed by [place] ([employer]) to apply for manufacturer assistance. Application completed and faxed to [employer] at [phone] on 09/01/16.  It is noted that patient took last dose of Zytiga on 09/01/16 and will not have any aditional medication until we can identify assistance paying for the Zytiga.  Oral Chemo Clinic will continue to follow.  Johny Drilling, PharmD, BCPS 09/02/2016  9:45 AM Oral Chemotherapy Clinic [phone]

## 2016-09-02 NOTE — Progress Notes (Signed)
[place] Work  Clinical Social Work met with Evelena Peat, oral chemo navigator/pharmacist and was informed Fabio Asa should be covered under patient's Humana Medicare Part D plan.   [phone][facility] and provided patient's Medicare part D Member ID # [id number], Rx Group [id number], Rx Bin [id number], Rx PCN [id number]. [facility] within the next 24 hours.  Patient will be responsible for co-pay and will need to explore assistance options.  CSW contacted Mr. Petrovic by phone and provided information above.  CSW explained that Evelena Peat, pharmacist, would be contacting patient regarding Zytiga coverage.    Polo Riley, MSW, LCSW, OSW-C Clinical Social Worker [facility] [phone]

## 2016-09-02 NOTE — Telephone Encounter (Signed)
Oral Chemotherapy Pharmacist Encounter  Providing Zytiga samples to patient while trying to get approved for copay assistance. MD aware. Dispensed Zytiga 250mg  tablets Quantity dipensed: 120 tablets Directions: Take 4 tablets (1000mg  total) by moth daily on an empty stomach, 1 hour before or 2 hours after a meal. Manufacturer: [employer]sen Lot# XGCT Exp: 05/2017  Notified patient, he will come to [facility] today to pick up month supply of meds. Patient aware that we have no other samples in house, but will be working diligently to identify copay assistance for his medications.  Patient understands and is agreement with this plan.  Johny Drilling, PharmD, BCPS 09/02/2016  3:32 PM Oral Chemotherapy Clinic [phone]

## 2016-09-02 NOTE — Progress Notes (Signed)
[place] Work  Clinical Social Work met with patient regarding [employer] and oral chemo medication.  Patient shared he was unable to fill zytiga refill because he was no longer insured.  CSW worked with oral chemo pharmacist to apply for drug coverage through [employer].  CSW and patient completed all necessary paperwork to apply for Medicare part B (patient already has part A and D).  Patient awaiting confirmation for Medicare part B.   Polo Riley, MSW, LCSW, OSW-C Clinical Social Worker [facility] [phone]

## 2016-09-06 ENCOUNTER — Encounter: Payer: Self-pay | Admitting: Radiation Oncology

## 2016-09-06 NOTE — Progress Notes (Signed)
Paperwork [employer]) received 11/20, given to the nurse 11/21

## 2016-09-07 ENCOUNTER — Telehealth: Payer: Self-pay | Admitting: Radiation Oncology

## 2016-09-07 NOTE — Telephone Encounter (Addendum)
Received message from patient requesting return call. Phoned patient. He reports new onset left buttock pain. Reports pain is positional and resolves with standing. Reports taking two Excedrin extra strength tablets every six hours for pain relief. Denies edema of lower extremities. Denies incontinence of bowel or bladder. Denies weakness. Confirms he is ambulatory. Denies nausea or vomiting. Patient explains his appointment for Fabio Asa was moved out until his receives his medicaid card. Explained this RN will forward this information onto Dr. Tammi Klippel and call him back with directions about next steps in care. Stressed if pain worsens, he becomes weak, unable to ambulate or incontinent to present to the emergency room asap. Patient verbalized understanding.

## 2016-09-12 ENCOUNTER — Encounter: Payer: Self-pay | Admitting: *Deleted

## 2016-09-13 ENCOUNTER — Telehealth: Payer: Self-pay | Admitting: Radiation Oncology

## 2016-09-13 NOTE — Telephone Encounter (Signed)
Placed DISABILITY MANAGEMENT SOLUTION paperwork that I filled out to the best of my ability in [employer], PA-C inbox to sign.

## 2016-09-14 ENCOUNTER — Telehealth: Payer: Self-pay | Admitting: Oncology

## 2016-09-14 ENCOUNTER — Encounter: Payer: Self-pay | Admitting: Radiation Oncology

## 2016-09-14 NOTE — Progress Notes (Signed)
PAPERWORK Psychologist, counselling) [place], FAXED TO [phone] ATTN GREGORY, [place], COPY MAILED TO PATIENT 11/29

## 2016-09-14 NOTE — Telephone Encounter (Signed)
sw pt to confirm r/s appt to 12/13 at 1245 pm per LOS

## 2016-09-23 ENCOUNTER — Encounter: Payer: Self-pay | Admitting: Oncology

## 2016-09-23 NOTE — Progress Notes (Signed)
Faxed disability paperwork to [employer] ATTN: Belenda Cruise.

## 2016-09-26 ENCOUNTER — Encounter: Payer: Self-pay | Admitting: Oncology

## 2016-09-26 NOTE — Progress Notes (Signed)
Faxed disability paperwork to [facility].

## 2016-09-27 ENCOUNTER — Telehealth: Payer: Self-pay | Admitting: Radiation Oncology

## 2016-09-27 NOTE — Telephone Encounter (Signed)
Returned patient's call. Patient phoning to ensure tomorrow is Wednesday and that he has an appointment with Dr. [name]. Confirmed it was and that he does. Difficulty word finding is evident. Patient requesting this RN inform Dr. [name] of recent changes since tapering off decadron on November 14 because he is "afriad he won't get it all out during the visit." Patient reports his left hip aches constantly. Reports excedrin relieves hip pain only briefly. Also, reports his left legs "wants to give way when he is walking." Denies edema of lower extremities. Reports visual floaters on rare occasion. Reports he has lost approximately 10 lb since November 14 because his appetite is poor.Denies headache, dizziness, nausea, vomiting, diplopia or ringing in the ears. Requesting Dr. [name] refill his Zantac. Informed Dr. [name] of the findings.

## 2016-09-28 ENCOUNTER — Ambulatory Visit (HOSPITAL_BASED_OUTPATIENT_CLINIC_OR_DEPARTMENT_OTHER): Payer: Medicare Other

## 2016-09-28 ENCOUNTER — Other Ambulatory Visit: Payer: Self-pay | Admitting: *Deleted

## 2016-09-28 ENCOUNTER — Ambulatory Visit (HOSPITAL_BASED_OUTPATIENT_CLINIC_OR_DEPARTMENT_OTHER): Payer: Medicare Other | Admitting: Oncology

## 2016-09-28 ENCOUNTER — Other Ambulatory Visit (HOSPITAL_BASED_OUTPATIENT_CLINIC_OR_DEPARTMENT_OTHER): Payer: Medicare Other

## 2016-09-28 VITALS — BP 105/71 | HR 109 | Temp 98.0°F | Resp 18 | Ht 71.0 in | Wt 195.0 lb

## 2016-09-28 DIAGNOSIS — C7951 Secondary malignant neoplasm of bone: Secondary | ICD-10-CM | POA: Diagnosis not present

## 2016-09-28 DIAGNOSIS — E291 Testicular hypofunction: Secondary | ICD-10-CM

## 2016-09-28 DIAGNOSIS — C61 Malignant neoplasm of prostate: Secondary | ICD-10-CM

## 2016-09-28 DIAGNOSIS — C7931 Secondary malignant neoplasm of brain: Secondary | ICD-10-CM

## 2016-09-28 LAB — CBC WITH DIFFERENTIAL/PLATELET
BASO%: 0.8 % (ref 0.0–2.0)
BASOS ABS: 0 10*3/uL (ref 0.0–0.1)
EOS ABS: 0.1 10*3/uL (ref 0.0–0.5)
EOS%: 2.3 % (ref 0.0–7.0)
HEMATOCRIT: 38 % — AB (ref 38.4–49.9)
HEMOGLOBIN: 12.6 g/dL — AB (ref 13.0–17.1)
LYMPH#: 1.5 10*3/uL (ref 0.9–3.3)
LYMPH%: 26.3 % (ref 14.0–49.0)
MCH: 27.5 pg (ref 27.2–33.4)
MCHC: 33.1 g/dL (ref 32.0–36.0)
MCV: 83 fL (ref 79.3–98.0)
MONO#: 0.5 10*3/uL (ref 0.1–0.9)
MONO%: 9.2 % (ref 0.0–14.0)
NEUT%: 61.4 % (ref 39.0–75.0)
NEUTROS ABS: 3.4 10*3/uL (ref 1.5–6.5)
Platelets: 277 10*3/uL (ref 140–400)
RBC: 4.57 10*6/uL (ref 4.20–5.82)
RDW: 15.3 % — AB (ref 11.0–14.6)
WBC: 5.6 10*3/uL (ref 4.0–10.3)

## 2016-09-28 LAB — COMPREHENSIVE METABOLIC PANEL
ALBUMIN: 3.1 g/dL — AB (ref 3.5–5.0)
ALK PHOS: 339 U/L — AB (ref 40–150)
ALT: 12 U/L (ref 0–55)
AST: 38 U/L — AB (ref 5–34)
Anion Gap: 11 mEq/L (ref 3–11)
BILIRUBIN TOTAL: 0.76 mg/dL (ref 0.20–1.20)
BUN: 4.5 mg/dL — AB (ref 7.0–26.0)
CALCIUM: 8.8 mg/dL (ref 8.4–10.4)
CO2: 25 mEq/L (ref 22–29)
CREATININE: 0.7 mg/dL (ref 0.7–1.3)
Chloride: 105 mEq/L (ref 98–109)
EGFR: >90 mL/min/{1.73_m2} (ref >90)
GLUCOSE: 102 mg/dL (ref 70–140)
POTASSIUM: 2.7 meq/L — AB (ref 3.5–5.1)
SODIUM: 142 meq/L (ref 136–145)
TOTAL PROTEIN: 6.9 g/dL (ref 6.4–8.3)

## 2016-09-28 IMAGING — CT CT HEAD W/O CM
2 series · 15 of 30 positions shown, 19 images · non-contrast
Comparison: MRI of the brain performed 01/22/2016

CLINICAL DATA: Acute onset of slurred speech. Known brain
metastases from pancreatic cancer. Initial encounter.

EXAM:
CT HEAD WITHOUT CONTRAST
TECHNIQUE: Contiguous axial images were obtained from the base of the skull
through the vertex without intravenous contrast.

[Series 2: head w/o · axial · non-contrast · 0.46mm/px · z∈[-221,-96]mm · 13 of 31 slices shown, 17 images]
[im 3/31  brain]
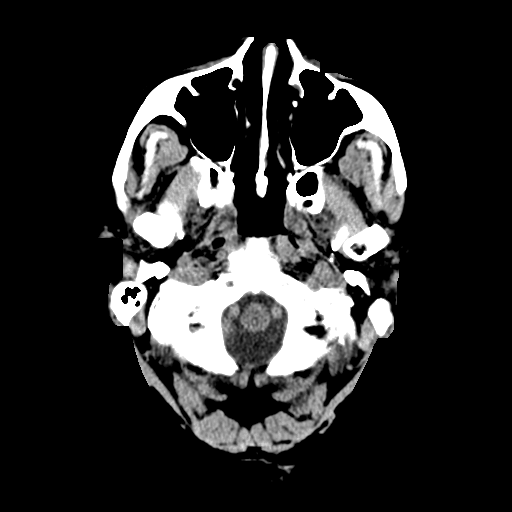
[im 3/31  bone]
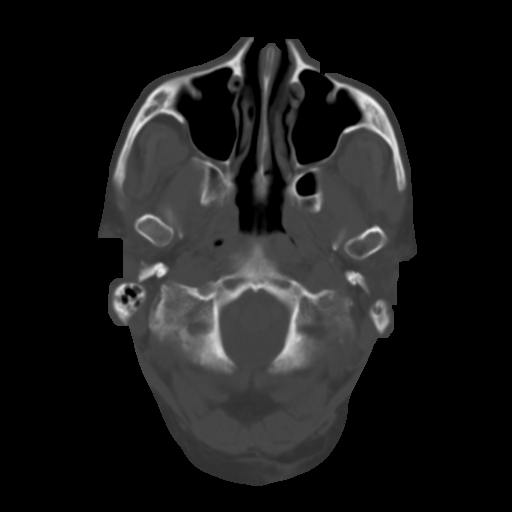
[im 5/31  brain]
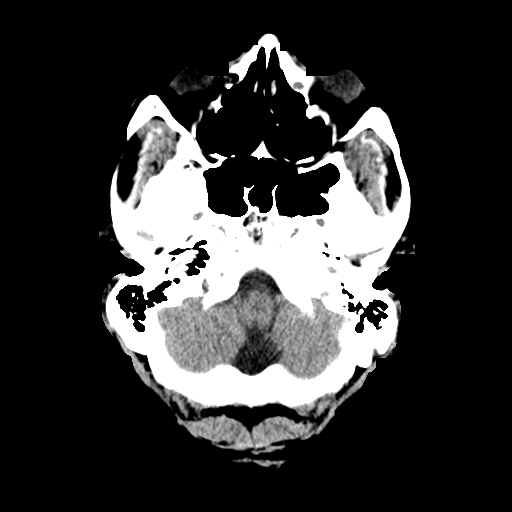
[im 7/31  brain]
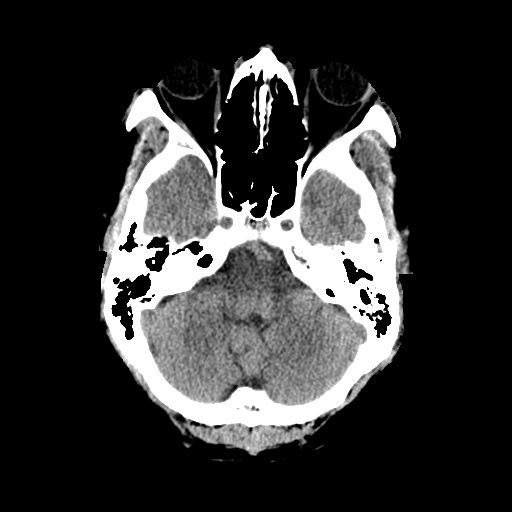
[im 9/31  brain]
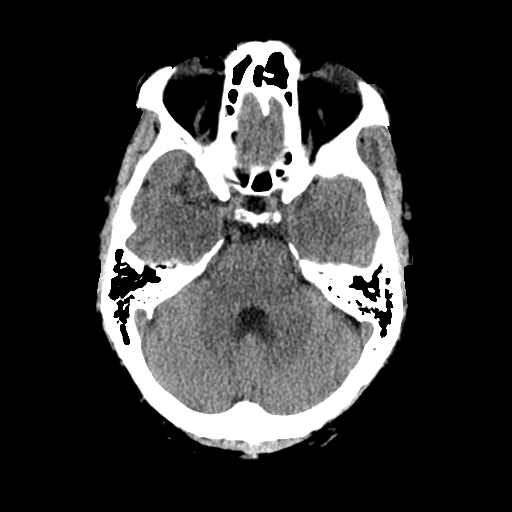
[im 11/31  brain]
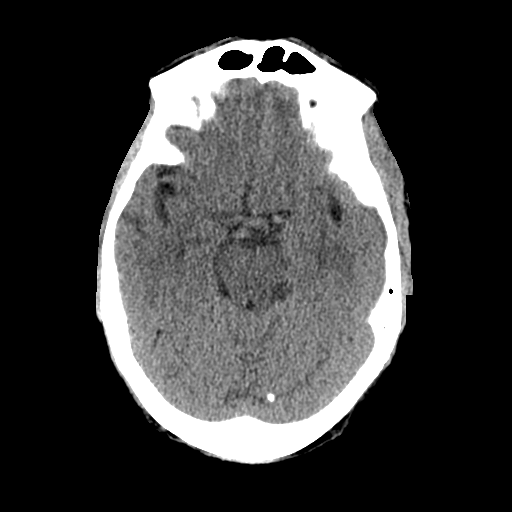
[im 11/31  bone]
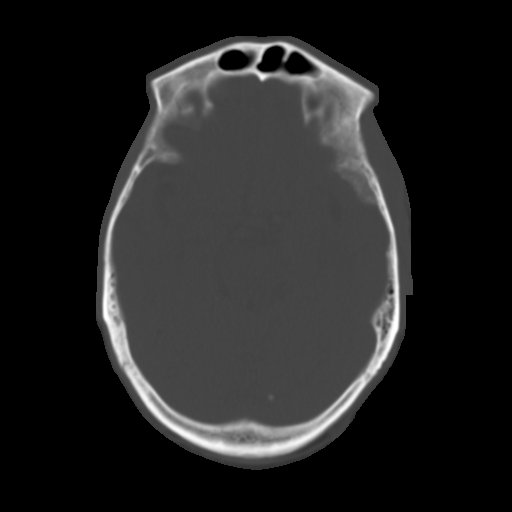
[im 13/31  brain]
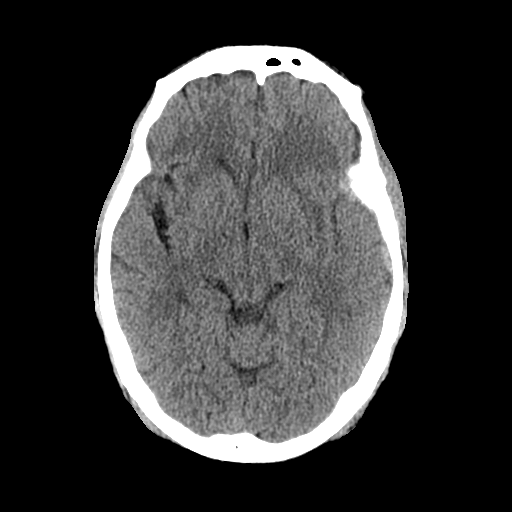
[im 16/31  brain]
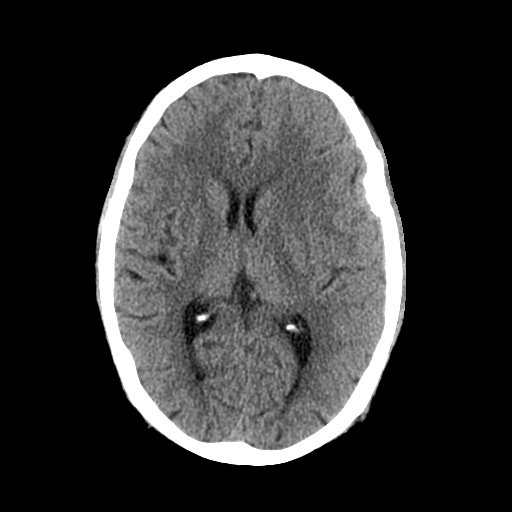
[im 18/31  brain]
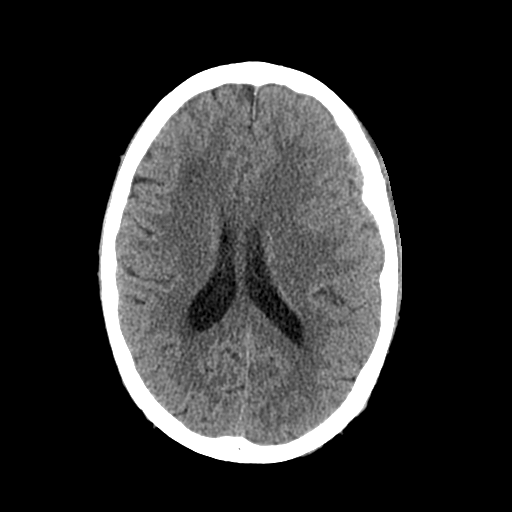
[im 20/31  brain]
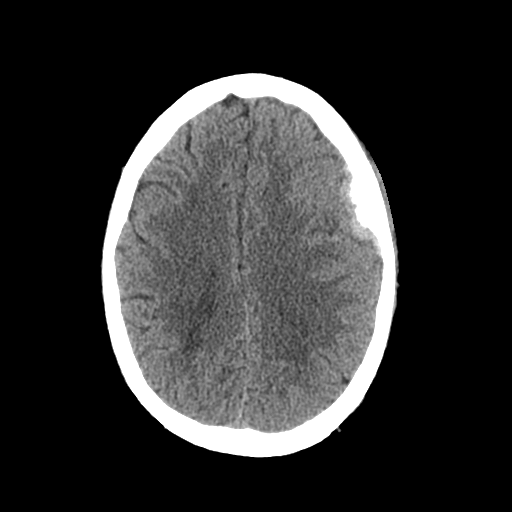
[im 20/31  bone]
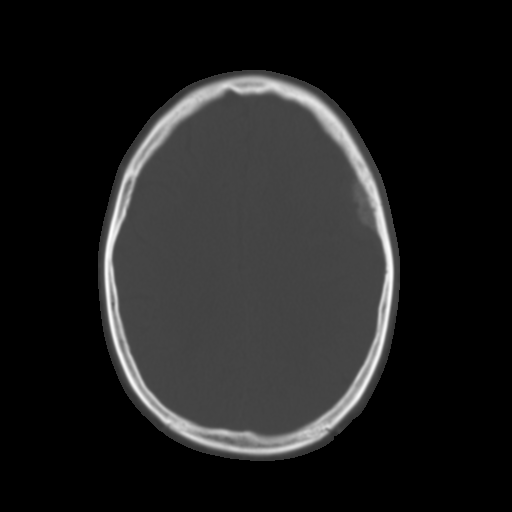
[im 22/31  brain]
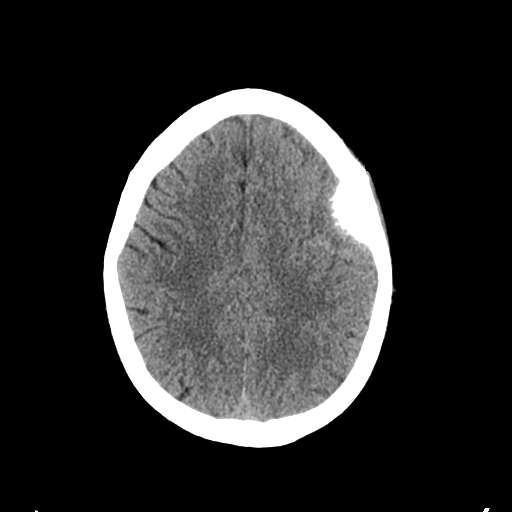
[im 24/31  brain]
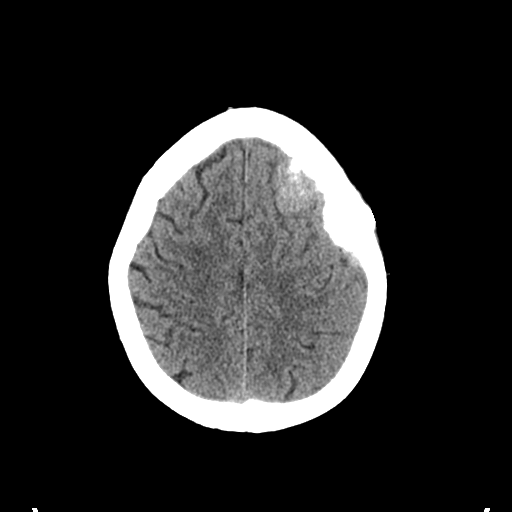
[im 26/31  brain]
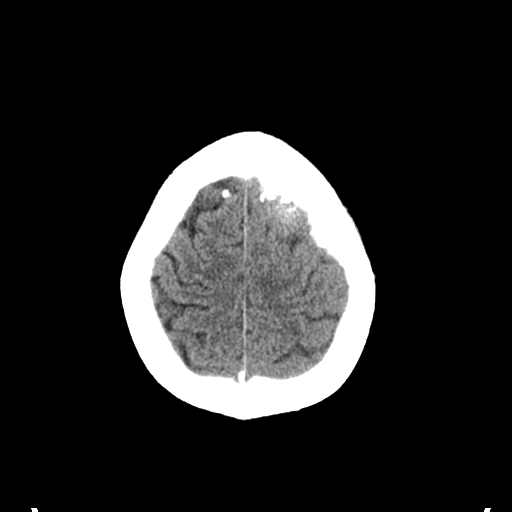
[im 28/31  brain]
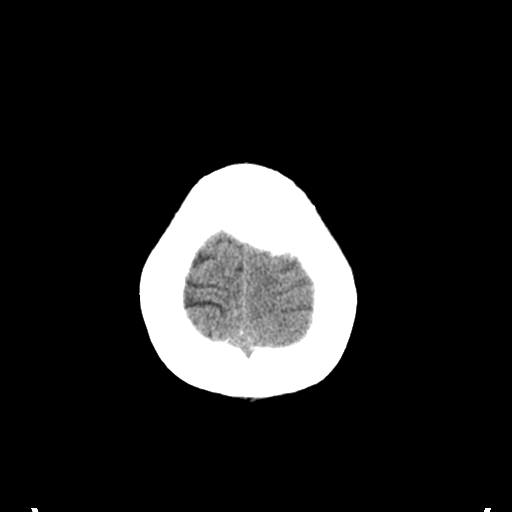
[im 28/31  bone]
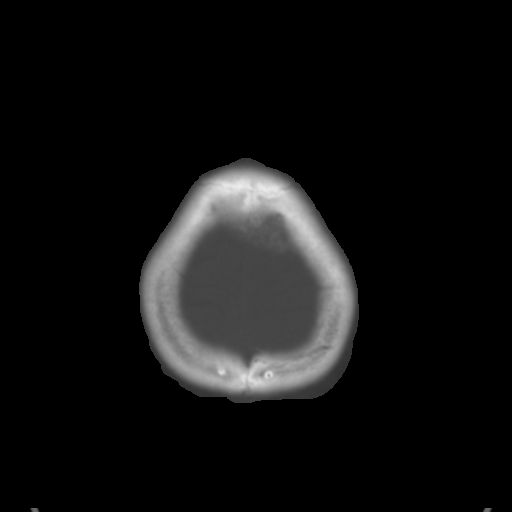

[Series 3: bone windows · axial · 0.46mm/px · z∈[-221,-201]mm · 2 of 31 slices shown]
[im 3/31  bone]
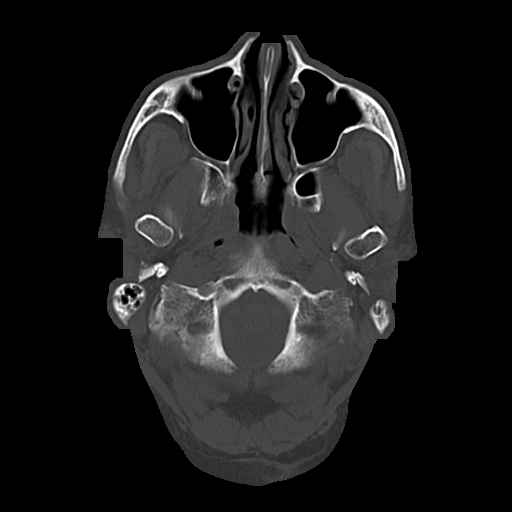
[im 7/31  bone]
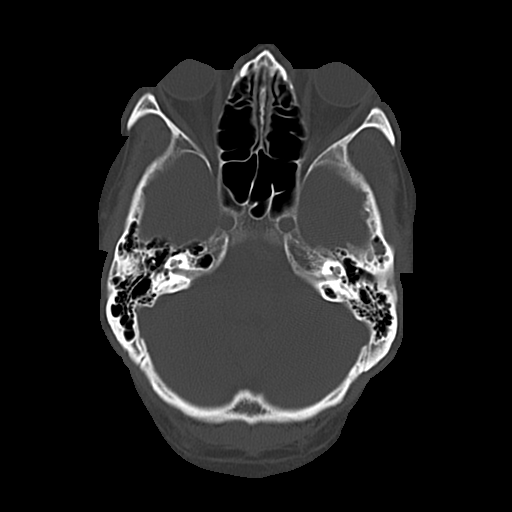

[15 of 30 positions shown; findings below may reference images not displayed]

FINDINGS: There is no evidence of acute infarction, or intra- or extra-axial
hemorrhage on CT.

A large partially calcified dural/epidural soft tissue mass is again
noted along the left cerebral convexity, with two lobulations each
measuring up to 3.6 cm. Approximately 6 mm of rightward midline
shift is seen; the degree of midline shift appears somewhat
improved.

Additional tumor anterior to the left temporal lobe is partially
characterized. Additional known tumors are not well seen on CT. Mild
subcortical white matter change likely reflects small vessel
ischemic microangiopathy.

The brainstem and fourth ventricle are within normal limits. The
basal ganglia are unremarkable in appearance.

There is no evidence of fracture; visualized osseous structures are
unremarkable in appearance. The orbits are within normal limits. The
paranasal sinuses and mastoid air cells are well-aerated. No
significant soft tissue abnormalities are seen.
IMPRESSION: 1. Large partially calcified dural/epidural soft tissue masses again
noted. The largest, along the left cerebral convexity, has two
lobulations, each measuring up to 3.6 cm. 6 mm of rightward midline
shift noted, somewhat improved from the prior MRI.
2. Additional known tumors are not well seen on noncontrast CT.
3. Mild small vessel ischemic microangiopathy.

## 2016-09-28 MED ORDER — DENOSUMAB 120 MG/1.7ML ~~LOC~~ SOLN
120.0000 mg | Freq: Once | SUBCUTANEOUS | Status: AC
  Administered 2016-09-28: 120 mg via SUBCUTANEOUS
  Filled 2016-09-28: qty 1.7

## 2016-09-28 MED ORDER — KLOR-CON M20 20 MEQ PO TBCR: 20.0000 meq | EXTENDED_RELEASE_TABLET | Freq: Two times a day (BID) | ORAL | 0 refills | Status: DC

## 2016-09-28 NOTE — Progress Notes (Signed)
Hematology and Oncology Follow Up Visit  Steven Church [id number] May 26, 1951 65 y.o. 09/28/2016 1:11 PM   Principle Diagnosis: 65 year old gentleman with prostate cancer presented with diffuse bony metastasis and lymphadenopathy. His PSA is 899 with unknown Gleason score. His diagnosis was confirmed in June 2016. 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   Prior Therapy:  He is status post a biopsy obtained of the soft tissue in the retroperitoneal area on 04/03/2015. He is status post whole brain radiation for a total of 30 gray in 10 fractions completed in April 2017.  Current therapy:  Lupron 22.5 mg IM every 3 months started in June 2016.  Xgeva to start on 07/24/2015.  Zytiga 1000 mg started in April 2017.  Interim History:  Steven Church presents today for a follow-up visit. Since the last visit, he reports some decline in his overall health. He is reporting decrease in his appetite and slight increase in his left-sided hip pain. He is ambulating without major difficulties. He has not reported any falls or syncope. He denied any headaches or neurological deficits. He denied any visual changes or recent hospitalizations.  He continues to be on Zytiga without any new complications. He denied any lower extremity swelling or arthralgias. He denied any nausea or vomiting. His dexamethasone has been tapered has been completely off for the last 4 weeks.   He does not report any headaches, blurry vision, syncope or seizures. He does not report any fevers, chills, sweats, weight loss or appetite changes. He does not report any chest pain, palpitation, orthopnea or leg edema. He does not report any cough, shortness of breath, dyspnea on exertion, wheezing or hemoptysis. He does not report any nausea, vomiting, abdominal pain, hematochezia, or early satiety. He does not report any hematuria or dysuria. He does report frequency,  hesitancy and nocturia. He does not report any skeletal complaints of arthralgias or myalgias. Remaining review of systems unremarkable.  Medications: I have reviewed the patient's current medications.  Current Outpatient Prescriptions  Medication Sig Dispense Refill  . amLODipine (NORVASC) 10 MG tablet TAKE 1 TABLET EVERY DAY 90 tablet 0  . Aspirin-Caffeine (BC FAST PAIN RELIEF PO) Take 1 Package by mouth as needed (pain). Reported on 03/21/2016    . calcitRIOL (ROCALTROL) 0.5 MCG capsule TAKE 1 CAPSULE TWICE A DAY 180 capsule 0  . calcium carbonate (TUMS - DOSED IN MG ELEMENTAL CALCIUM) 500 MG chewable tablet Chew 5 tablets (1,000 mg of elemental calcium total) by mouth 3 (three) times daily. 90 tablet 0  . [facility] OYSTER SHELL CALCIUM+VIT D 500-125 MG-UNIT TABS TAKE 1 TABLET BY MOUTH TWICE A DAY 60 tablet 3  . dexamethasone (DECADRON) 2 MG tablet Take 1 tablet (2 mg total) by mouth daily. 30 tablet 0  . finasteride (PROSCAR) 5 MG tablet Take 1 tablet (5 mg total) by mouth daily. 90 tablet 1  . losartan (COZAAR) 100 MG tablet Take 100 mg by mouth daily.  3  . omeprazole (PRILOSEC) 40 MG capsule TAKE 1 CAPSULE (40 MG TOTAL) BY MOUTH DAILY. 90 capsule 1  . tamsulosin (FLOMAX) 0.4 MG CAPS capsule TAKE 1 CAPSULE (0.4 MG TOTAL) BY MOUTH DAILY. 30 capsule 2  . Vitamin D, Ergocalciferol, (DRISDOL) 50000 units CAPS capsule Take 1 capsule (50,000 Units total) by mouth every other day. 30 capsule 0  . ZYTIGA 250 MG tablet TAKE FOUR TABLETS BY MOUTH ONE TIME DAILY ON AN EMPTY STOMACH 120 tablet 0  .  KLOR-CON M20 20 MEQ tablet Take 1 tablet (20 mEq total) by mouth 2 (two) times daily. 60 tablet 0   No current facility-administered medications for this visit.    Facility-Administered Medications Ordered in Other Visits  Medication Dose Route Frequency Provider Last Rate Last Dose  . denosumab (XGEVA) injection 120 mg  120 mg Subcutaneous Once [name], MD         Allergies:  Allergies  Allergen  Reactions  . Losartan Potassium-Hctz Other (See Comments)    dizziness    Past Medical History, Surgical history, Social history, and Family History were reviewed and updated.   Physical Exam: Blood pressure 105/71, pulse (!) 109, temperature 98 F (36.7 C), temperature source Oral, resp. rate 18, height 5\' 11"  (1.803 m), weight 195 lb (88.5 kg), SpO2 99 %. ECOG: 1 General appearance: Chronically ill-appearing appeared without distress. Head: Normocephalic, without obvious abnormality no oral thrush noted. Neck: no adenopathy thyroid masses. Lymph nodes: Cervical, supraclavicular, and axillary nodes normal. Heart:regular rate and rhythm, S1, S2 normal, no murmur, click, rub or gallop Lung:chest clear, no wheezing, rales, normal symmetric air entry Abdomin: soft, non-tender, without masses or organomegaly no rebound or guarding. EXT:no erythema, induration, or clubbing.  Neurological examination: No deficits noted.  Lab Results: Lab Results  Component Value Date   WBC 5.6 09/28/2016   HGB 12.6 (L) 09/28/2016   HCT 38.0 (L) 09/28/2016   MCV 83.0 09/28/2016   PLT 277 09/28/2016     Chemistry      Component Value Date/Time   NA 142 09/28/2016 1155   K 2.7 (LL) 09/28/2016 1155   CL 110 03/16/2016 0454   CO2 25 09/28/2016 1155   BUN 4.5 (L) 09/28/2016 1155   CREATININE 0.7 09/28/2016 1155      Component Value Date/Time   CALCIUM 8.8 09/28/2016 1155   ALKPHOS 339 (H) 09/28/2016 1155   AST 38 (H) 09/28/2016 1155   ALT 12 09/28/2016 1155   BILITOT 0.76 09/28/2016 1155        Results for Steven Church, Steven Church. (MRN [id number]) as of 09/28/2016 13:16  Ref. Range 04/08/2016 11:23 04/29/2016 12:13 05/31/2016 11:04 06/28/2016 14:58 07/29/2016 11:22  PSA Latest Ref Range: 0.0 - 4.0 ng/mL 70.0 (H) 46.7 (H) 30.2 (H) 34.4 (H) 39.8 (H)       Impression and Plan:  65 year old gentleman with the following issues:  1. Advanced prostate cancer presenting with diffuse bony  metastasis and bulky lymphadenopathy and PSA of 899.8. This is biopsy proven to be prostate cancer.   He started in June 2016 but have developed castration-resistant disease with a PSA rise up to 702.5 As well as brain metastasis.  He is currently on Zytiga and have tolerated it well. His PSA had an excellent response initially with a nadir down to 30.2 in August 2017. His most recent PSA in October 2017 to 39 and it was repeated today and currently pending. He is experiencing some more complaints could be related to progression of his disease.  The plan going forward is to restage him with a CT scan and a bone scan and determine the next approach. Given his high-risk malignancy he would benefit from Taxotere chemotherapy sooner rather than later. He still has a reasonable performance status and Taxotere chemotherapy offers a good option at this time. Complications associated with this medication were reviewed today include nausea, vomiting, myelosuppression, neutropenia and possible sepsis. Neuropathy and have lost also consideration. He is agreeable to proceed after chemotherapy  education class I requested that I discussed this with his sister. I will communicate this with [name] his sister as soon as possible and I encouraged him to bring her to chemotherapy education class to have it all the questions answered. I also urged him to bring family members his next appointment before the start of chemotherapy for any other questions.  I feel that the opportunity to treat with systemic chemotherapy could be closing and is critical to get that treatment started in the near future.  2. Bone directed therapy: He received Xgeva without any major complications. He will receive this with every visit on a monthly basis.  3. Dural/epidural mass: He is status post whole brain radiation without any residual complications. He does not report any new neurological deficits or symptoms.  4. Pain: He is reporting more  hip pain at this time and we will repeat bone scan and CT scan for staging purposes. He is not requiring any pain medication at this time but will continue to monitor.  5. Prognosis: He does have incurable malignancy that has been palliated effectively at this time. His performance status remains excellent and recommend continuing aggressive treatment.  6. Androgen deprivation: He will continue on Lupron 22.5 mg every 3 months and he will receive his next injection in January 2018.  7. IV access: Risks and benefits of a Port-A-Cath insertion were reviewed today. These complications include bleeding, infection and thrombosis. We will ask interventional radiology to currently place this before the first week of January.  8. Antiemetics: Prescription for Compazine will be made available to the patient before the start of chemotherapy.  9. Neutropenia prophylaxis: He will receive Neulasta after each chemotherapy cycle.  7. Follow-up: Will be in the next 2-3 weeks before the start of systemic chemotherapy.     [id number], MD 12/13/20171:11 PM

## 2016-09-28 NOTE — Patient Instructions (Signed)
Denosumab injection  What is this medicine?  DENOSUMAB (den oh sue mab) slows bone breakdown. Prolia is used to treat osteoporosis in women after menopause and in men. Xgeva is used to prevent bone fractures and other bone problems caused by cancer bone metastases. Xgeva is also used to treat giant cell tumor of the bone.  This medicine may be used for other purposes; ask your health care provider or pharmacist if you have questions.  What should I tell my health care provider before I take this medicine?  They need to know if you have any of these conditions:  -dental disease  -eczema  -infection or history of infections  -kidney disease or on dialysis  -low blood calcium or vitamin D  -malabsorption syndrome  -scheduled to have surgery or tooth extraction  -taking medicine that contains denosumab  -thyroid or parathyroid disease  -an unusual reaction to denosumab, other medicines, foods, dyes, or preservatives  -pregnant or trying to get pregnant  -breast-feeding  How should I use this medicine?  This medicine is for injection under the skin. It is given by a health care professional in a hospital or clinic setting.  If you are getting Prolia, a special MedGuide will be given to you by the pharmacist with each prescription and refill. Be sure to read this information carefully each time.  For Prolia, talk to your pediatrician regarding the use of this medicine in children. Special care may be needed. For Xgeva, talk to your pediatrician regarding the use of this medicine in children. While this drug may be prescribed for children as young as 13 years for selected conditions, precautions do apply.  Overdosage: If you think you have taken too much of this medicine contact a poison control center or emergency room at once.  NOTE: This medicine is only for you. Do not share this medicine with others.  What if I miss a dose?  It is important not to miss your dose. Call your doctor or health care professional if you are  unable to keep an appointment.  What may interact with this medicine?  Do not take this medicine with any of the following medications:  -other medicines containing denosumab  This medicine may also interact with the following medications:  -medicines that suppress the immune system  -medicines that treat cancer  -steroid medicines like prednisone or cortisone  This list may not describe all possible interactions. Give your health care provider a list of all the medicines, herbs, non-prescription drugs, or dietary supplements you use. Also tell them if you smoke, drink alcohol, or use illegal drugs. Some items may interact with your medicine.  What should I watch for while using this medicine?  Visit your doctor or health care professional for regular checks on your progress. Your doctor or health care professional may order blood tests and other tests to see how you are doing.  Call your doctor or health care professional if you get a cold or other infection while receiving this medicine. Do not treat yourself. This medicine may decrease your body's ability to fight infection.  You should make sure you get enough calcium and vitamin D while you are taking this medicine, unless your doctor tells you not to. Discuss the foods you eat and the vitamins you take with your health care professional.  See your dentist regularly. Brush and floss your teeth as directed. Before you have any dental work done, tell your dentist you are receiving this medicine.  Do   not become pregnant while taking this medicine or for 5 months after stopping it. Women should inform their doctor if they wish to become pregnant or think they might be pregnant. There is a potential for serious side effects to an unborn child. Talk to your health care professional or pharmacist for more information.  What side effects may I notice from receiving this medicine?  Side effects that you should report to your doctor or health care professional as soon as  possible:  -allergic reactions like skin rash, itching or hives, swelling of the face, lips, or tongue  -breathing problems  -chest pain  -fast, irregular heartbeat  -feeling faint or lightheaded, falls  -fever, chills, or any other sign of infection  -muscle spasms, tightening, or twitches  -numbness or tingling  -skin blisters or bumps, or is dry, peels, or red  -slow healing or unexplained pain in the mouth or jaw  -unusual bleeding or bruising  Side effects that usually do not require medical attention (Report these to your doctor or health care professional if they continue or are bothersome.):  -muscle pain  -stomach upset, gas  This list may not describe all possible side effects. Call your doctor for medical advice about side effects. You may report side effects to FDA at 1-800-FDA-1088.  Where should I keep my medicine?  This medicine is only given in a clinic, doctor's office, or other health care setting and will not be stored at home.  NOTE: This sheet is a summary. It may not cover all possible information. If you have questions about this medicine, talk to your doctor, pharmacist, or health care provider.      2016, [employer]vier/Gold Standard. (2012-04-02 12:37:47)

## 2016-09-28 NOTE — Progress Notes (Signed)
DISCONTINUE ON PATHWAY REGIMEN - Prostate  POS76: Abiraterone 1,000 mg Daily + Prednisone 5 mg BID  Until Progression or Toxicity   Daily:     Abiraterone acetate (Zytiga(R)) 1000 mg Orally once daily with water on an empty stomach.  For baseline moderate hepatic impairment reduce dose to 250 mg once daily. Dose Mod: None     Prednisone 5 mg orally twice daily.  Administer with food to decrease GI upset. Dose Mod: None  **Always confirm dose/schedule in your pharmacy ordering system**    REASON: Disease Progression PRIOR TREATMENT: POS76: Abiraterone 1,000 mg Daily + Prednisone 5 mg BID  Until Progression or Toxicity TREATMENT RESPONSE: Partial Response (PR)  START ON PATHWAY REGIMEN - Prostate  POS37: Docetaxel 75 mg/m2 q21 Days + Prednisone 5 mg BID Until Progression or Toxicity   A cycle is every 21 days:     Docetaxel (Taxotere(R)) 75 mg/m2 in 250 mL NS IV over one hour Dose Mod: None     Prednisone 5 mg orally twice daily Dose Mod: None Additional Orders: Premedicate with dexamethasone 8 mg PO bid for three days beginning 1 day prior to therapy  **Always confirm dose/schedule in your pharmacy ordering system**    Patient Characteristics: Adenocarcinoma, Metastatic, Castration Resistant, Symptomatic, Docetaxel Eligible AJCC T Stage: X AJCC Stage Grouping: Unknown Current radiographic evidence of distant metastasis? Yes PSA: >= 20 Gleason Primary: X Gleason Secondary: X Gleason Score: 9 AJCC M Stage: X AJCC N Stage: X Would you be surprised if this patient died  in the next year? I would be surprised if this patient died in the next year  Intent of Therapy: Non-Curative / Palliative Intent, Discussed with Patient

## 2016-09-28 NOTE — Progress Notes (Signed)
ON PATHWAY REGIMEN - Prostate  No Change  Continue With Treatment as Ordered.  POS76: Abiraterone 1,000 mg Daily + Prednisone 5 mg BID  Until Progression or Toxicity   Daily:     Abiraterone acetate (Zytiga(R)) 1000 mg Orally once daily with water on an empty stomach.  For baseline moderate hepatic impairment reduce dose to 250 mg once daily. Dose Mod: None     Prednisone 5 mg orally twice daily.  Administer with food to decrease GI upset. Dose Mod: None  **Always confirm dose/schedule in your pharmacy ordering system**    Patient Characteristics: Adenocarcinoma, Metastatic, Castration Resistant, Symptomatic/Poor Response to Prior Hormonal Therapy, Prior Docetaxel/Docetaxel Ineligible AJCC T Stage: X AJCC Stage Grouping: IV Current radiographic evidence of distant metastasis? Yes PSA: X Gleason Primary: X Gleason Secondary: X Gleason Score: X AJCC M Stage: X AJCC N Stage: X Would you be surprised if this patient died  in the next year? I would be surprised if this patient died in the next year  Intent of Therapy: Non-Curative / Palliative Intent, Discussed with Patient

## 2016-09-29 ENCOUNTER — Telehealth: Payer: Self-pay | Admitting: *Deleted

## 2016-09-29 LAB — PSA: PROSTATE SPECIFIC AG, SERUM: 114.8 ng/mL — AB (ref 0.0–4.0)

## 2016-09-29 NOTE — Telephone Encounter (Signed)
Per LOS I have scheduled appts and notified the scheduler 

## 2016-10-04 ENCOUNTER — Encounter: Payer: Self-pay | Admitting: *Deleted

## 2016-10-04 ENCOUNTER — Other Ambulatory Visit: Payer: Medicare Other

## 2016-10-04 NOTE — Telephone Encounter (Signed)
Oral Chemotherapy Pharmacist Encounter  Received notification from Dr. Hazeline Junker RN that patient is no longer taking Zytiga and is being transitioned to infusional docetaxel in Jan 2018. Patient assistance for Zytiga no longer needed. Polo Riley in social work department will be notified.  Oral Oncology Pharmacist will sign off at this time. Please let us know if we can be of further assistance in the future.  Johny Drilling, PharmD, BCPS, BCOP 10/04/2016  9:46 AM Oral Oncology Clinic [phone]

## 2016-10-05 ENCOUNTER — Other Ambulatory Visit: Payer: Self-pay | Admitting: *Deleted

## 2016-10-05 MED ORDER — PROCHLORPERAZINE MALEATE 10 MG PO TABS: 10.0000 mg | ORAL_TABLET | Freq: Four times a day (QID) | ORAL | 1 refills | Status: DC | PRN

## 2016-10-05 MED ORDER — LIDOCAINE 5 % EX OINT: 1.0000 "application " | TOPICAL_OINTMENT | CUTANEOUS | 1 refills | Status: DC | PRN

## 2016-10-06 ENCOUNTER — Ambulatory Visit (HOSPITAL_COMMUNITY): Admission: RE | Admit: 2016-10-06 | Payer: Medicare Other | Source: Ambulatory Visit

## 2016-10-06 ENCOUNTER — Encounter (HOSPITAL_COMMUNITY): Payer: Self-pay

## 2016-10-06 ENCOUNTER — Ambulatory Visit (HOSPITAL_COMMUNITY)
Admission: RE | Admit: 2016-10-06 | Discharge: 2016-10-06 | Disposition: A | Discharge status: Home / Self Care | Payer: Medicare Other | Source: Ambulatory Visit | Attending: Oncology | Admitting: Oncology

## 2016-10-06 DIAGNOSIS — I700 Atherosclerosis of aorta: Secondary | ICD-10-CM | POA: Insufficient documentation

## 2016-10-06 DIAGNOSIS — R590 Localized enlarged lymph nodes: Secondary | ICD-10-CM | POA: Diagnosis not present

## 2016-10-06 DIAGNOSIS — C61 Malignant neoplasm of prostate: Secondary | ICD-10-CM | POA: Insufficient documentation

## 2016-10-06 DIAGNOSIS — C7951 Secondary malignant neoplasm of bone: Secondary | ICD-10-CM | POA: Insufficient documentation

## 2016-10-06 DIAGNOSIS — R918 Other nonspecific abnormal finding of lung field: Secondary | ICD-10-CM | POA: Diagnosis not present

## 2016-10-06 MED ORDER — IOPAMIDOL (ISOVUE-300) INJECTION 61%
100.0000 mL | Freq: Once | INTRAVENOUS | Status: AC | PRN
  Administered 2016-10-06: 100 mL via INTRAVENOUS

## 2016-10-06 MED ORDER — TECHNETIUM TC 99M MEDRONATE IV KIT: 25.0000 | PACK | Freq: Once | INTRAVENOUS | Status: DC | PRN

## 2016-10-13 ENCOUNTER — Encounter: Payer: Self-pay | Admitting: Oncology

## 2016-10-13 ENCOUNTER — Encounter: Payer: Self-pay | Admitting: *Deleted

## 2016-10-13 NOTE — Progress Notes (Signed)
[place] Work  Clinical Social Work met with patient in [place] office today and assisted patient in opening bills/paperwork.  Patient shared his heat is not turned on.  CSW contacted [employer], they are not able to turn on heat until Tuesday 10/19/15.  CSW also spoke with patient's sister, [name], regarding his living arrangements.  Mr. Niswander plans to stay in a motel a few nights until he is able to obtain heat in his home.  CSW and patient met with financial counselor to apply for [employer].  CSW will continue to support patient as needed throughout treatment experience.    Polo Riley, MSW, LCSW, OSW-C Clinical Social Worker [facility] [phone]

## 2016-10-13 NOTE — Progress Notes (Signed)
Patient came in with SW(Lauren) to provide proof of income for grant. Patient approved for one-time $400 grant. Patient given a copy of the award letter as well as expense sheet. Patient has my card for any additional financial questions or concerns. Lauren assisting patient further with getting his gas heat turned on and how the deposit needs to be paid as well as how soon they can turn it on.

## 2016-10-18 ENCOUNTER — Other Ambulatory Visit: Payer: Self-pay | Admitting: General Surgery

## 2016-10-19 ENCOUNTER — Ambulatory Visit (HOSPITAL_COMMUNITY)
Admission: RE | Admit: 2016-10-19 | Discharge: 2016-10-19 | Disposition: A | Discharge status: Home / Self Care | Payer: Medicare Other | Source: Ambulatory Visit | Attending: Oncology | Admitting: Oncology

## 2016-10-19 ENCOUNTER — Other Ambulatory Visit: Payer: Self-pay | Admitting: Oncology

## 2016-10-19 ENCOUNTER — Encounter (HOSPITAL_COMMUNITY): Payer: Self-pay

## 2016-10-19 DIAGNOSIS — Z6827 Body mass index (BMI) 27.0-27.9, adult: Secondary | ICD-10-CM | POA: Diagnosis not present

## 2016-10-19 DIAGNOSIS — C7931 Secondary malignant neoplasm of brain: Secondary | ICD-10-CM | POA: Diagnosis not present

## 2016-10-19 DIAGNOSIS — Z87891 Personal history of nicotine dependence: Secondary | ICD-10-CM | POA: Insufficient documentation

## 2016-10-19 DIAGNOSIS — C7951 Secondary malignant neoplasm of bone: Secondary | ICD-10-CM | POA: Insufficient documentation

## 2016-10-19 DIAGNOSIS — C61 Malignant neoplasm of prostate: Secondary | ICD-10-CM

## 2016-10-19 DIAGNOSIS — Z9221 Personal history of antineoplastic chemotherapy: Secondary | ICD-10-CM | POA: Insufficient documentation

## 2016-10-19 DIAGNOSIS — E669 Obesity, unspecified: Secondary | ICD-10-CM | POA: Diagnosis not present

## 2016-10-19 DIAGNOSIS — Z7982 Long term (current) use of aspirin: Secondary | ICD-10-CM | POA: Insufficient documentation

## 2016-10-19 DIAGNOSIS — I10 Essential (primary) hypertension: Secondary | ICD-10-CM | POA: Insufficient documentation

## 2016-10-19 HISTORY — DX: Gastro-esophageal reflux disease without esophagitis: K21.9

## 2016-10-19 HISTORY — PX: IR GENERIC HISTORICAL: IMG1180011

## 2016-10-19 LAB — PROTIME-INR
INR: 1.26
Prothrombin Time: 15.9 seconds — ABNORMAL HIGH (ref 11.4–15.2)

## 2016-10-19 LAB — BASIC METABOLIC PANEL
Anion gap: 12 (ref 5–15)
BUN: 8 mg/dL (ref 6–20)
CHLORIDE: 106 mmol/L (ref 101–111)
CO2: 21 mmol/L — AB (ref 22–32)
CREATININE: 0.75 mg/dL (ref 0.61–1.24)
Calcium: 8.8 mg/dL — ABNORMAL LOW (ref 8.9–10.3)
GFR calc Af Amer: >60 mL/min (ref >60)
GFR calc non Af Amer: >60 mL/min (ref >60)
Glucose, Bld: 85 mg/dL (ref 65–99)
Potassium: 4.5 mmol/L (ref 3.5–5.1)
SODIUM: 139 mmol/L (ref 135–145)

## 2016-10-19 LAB — CBC WITH DIFFERENTIAL/PLATELET
Basophils Absolute: 0 10*3/uL (ref 0.0–0.1)
Basophils Relative: 1 %
EOS ABS: 0.2 10*3/uL (ref 0.0–0.7)
EOS PCT: 4 %
HCT: 35.7 % — ABNORMAL LOW (ref 39.0–52.0)
Hemoglobin: 12.6 g/dL — ABNORMAL LOW (ref 13.0–17.0)
LYMPHS ABS: 1.6 10*3/uL (ref 0.7–4.0)
Lymphocytes Relative: 34 %
MCH: 28.1 pg (ref 26.0–34.0)
MCHC: 35.3 g/dL (ref 30.0–36.0)
MCV: 79.5 fL (ref 78.0–100.0)
MONOS PCT: 8 %
Monocytes Absolute: 0.4 10*3/uL (ref 0.1–1.0)
Neutro Abs: 2.5 10*3/uL (ref 1.7–7.7)
Neutrophils Relative %: 53 %
PLATELETS: 283 10*3/uL (ref 150–400)
RBC: 4.49 MIL/uL (ref 4.22–5.81)
RDW: 14.9 % (ref 11.5–15.5)
WBC: 4.7 10*3/uL (ref 4.0–10.5)

## 2016-10-19 MED ORDER — CEFAZOLIN SODIUM-DEXTROSE 2-4 GM/100ML-% IV SOLN
2.0000 g | INTRAVENOUS | Status: AC
  Administered 2016-10-19: 2 g via INTRAVENOUS
  Filled 2016-10-19: qty 100

## 2016-10-19 MED ORDER — FENTANYL CITRATE (PF) 100 MCG/2ML IJ SOLN
INTRAMUSCULAR | Status: AC
  Filled 2016-10-19: qty 4

## 2016-10-19 MED ORDER — FLUMAZENIL 0.5 MG/5ML IV SOLN
INTRAVENOUS | Status: AC
  Filled 2016-10-19: qty 5

## 2016-10-19 MED ORDER — NALOXONE HCL 0.4 MG/ML IJ SOLN
INTRAMUSCULAR | Status: AC
  Filled 2016-10-19: qty 1

## 2016-10-19 MED ORDER — LIDOCAINE-EPINEPHRINE (PF) 2 %-1:200000 IJ SOLN
INTRAMUSCULAR | Status: AC | PRN
  Administered 2016-10-19: 10 mL

## 2016-10-19 MED ORDER — LIDOCAINE HCL 1 % IJ SOLN
INTRAMUSCULAR | Status: AC
  Filled 2016-10-19: qty 20

## 2016-10-19 MED ORDER — FENTANYL CITRATE (PF) 100 MCG/2ML IJ SOLN
INTRAMUSCULAR | Status: AC | PRN
  Administered 2016-10-19: 50 ug via INTRAVENOUS
  Administered 2016-10-19 (×2): 25 ug via INTRAVENOUS

## 2016-10-19 MED ORDER — HEPARIN SOD (PORK) LOCK FLUSH 100 UNIT/ML IV SOLN
INTRAVENOUS | Status: AC | PRN
  Administered 2016-10-19: 500 [IU] via INTRAVENOUS

## 2016-10-19 MED ORDER — MIDAZOLAM HCL 2 MG/2ML IJ SOLN
INTRAMUSCULAR | Status: AC | PRN
  Administered 2016-10-19: 1 mg via INTRAVENOUS
  Administered 2016-10-19 (×2): 0.5 mg via INTRAVENOUS

## 2016-10-19 MED ORDER — LIDOCAINE HCL 1 % IJ SOLN
INTRAMUSCULAR | Status: AC | PRN
  Administered 2016-10-19: 10 mL

## 2016-10-19 MED ORDER — HEPARIN SOD (PORK) LOCK FLUSH 100 UNIT/ML IV SOLN
INTRAVENOUS | Status: AC
  Filled 2016-10-19: qty 5

## 2016-10-19 MED ORDER — LIDOCAINE-EPINEPHRINE (PF) 2 %-1:200000 IJ SOLN
INTRAMUSCULAR | Status: AC
  Filled 2016-10-19: qty 20

## 2016-10-19 MED ORDER — MIDAZOLAM HCL 2 MG/2ML IJ SOLN
INTRAMUSCULAR | Status: AC
  Filled 2016-10-19: qty 4

## 2016-10-19 MED ORDER — SODIUM CHLORIDE 0.9 % IV SOLN
INTRAVENOUS | Status: DC
  Administered 2016-10-19: 13:00:00 via INTRAVENOUS

## 2016-10-19 NOTE — H&P (Signed)
Referring Physician(s): [name]  Supervising Physician: Markus Daft  Patient Status:  [facility]  Chief Complaint:  "I'm here for a port a  cath"  Subjective: Patient familiar to IR service from prior retroperitoneal mass biopsy in 2016 as well as tunneled PICC placement in May 2017 (removed 03/30/16). 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 Past Medical History:  Diagnosis Date  . Bone cancer ([place])   . Brain cancer ([place])   . Family history of cancer   . Former smoker   . Hypertension   . Obesity   . Prostate cancer ([place]) 04/07/2015   bone mets, started oncology 03/2015   Past Surgical History:  Procedure Laterality Date  . COLONOSCOPY     never     Allergies: Losartan potassium-hctz  Medications: Prior to Admission medications   Medication Sig Start Date End Date Taking? Authorizing Provider  amLODipine (NORVASC) 10 MG tablet TAKE 1 TABLET EVERY DAY 08/05/16   Camelia Eng Tysinger, PA-C  Aspirin-Caffeine (BC FAST PAIN RELIEF PO) Take 1 Package by mouth as needed (pain). Reported on 03/21/2016    Historical Provider, MD  calcitRIOL (ROCALTROL) 0.5 MCG capsule TAKE 1 CAPSULE TWICE A DAY 08/10/16   Camelia Eng Tysinger, PA-C  calcium carbonate (TUMS - DOSED IN MG ELEMENTAL CALCIUM) 500 MG chewable tablet Chew 5 tablets (1,000 mg of elemental calcium total) by mouth 3 (three) times daily. 03/16/16   Thurnell Lose, MD  [facility] SHELL CALCIUM+VIT D 500-125 MG-UNIT TABS TAKE 1 TABLET BY MOUTH TWICE A DAY 08/02/16   Camelia Eng Tysinger, PA-C  dexamethasone (DECADRON) 2 MG tablet Take 1 tablet (2 mg total) by mouth daily. 04/26/16   Hayden Pedro, PA-C  finasteride (PROSCAR) 5 MG tablet Take 1 tablet (5 mg total) by mouth daily. 04/20/16   Camelia Eng Tysinger, PA-C  KLOR-CON M20 20 MEQ tablet Take 1 tablet (20 mEq total) by mouth 2 (two) times daily.  09/28/16   [name], MD  lidocaine (XYLOCAINE) 5 % ointment Apply 1 application topically as needed. 10/05/16   [name], MD  losartan (COZAAR) 100 MG tablet Take 100 mg by mouth daily. 02/27/16   Historical Provider, MD  omeprazole (PRILOSEC) 40 MG capsule TAKE 1 CAPSULE (40 MG TOTAL) BY MOUTH DAILY. 05/10/16   Camelia Eng Tysinger, PA-C  prochlorperazine (COMPAZINE) 10 MG tablet Take 1 tablet (10 mg total) by mouth every 6 (six) hours as needed for nausea or vomiting. 10/05/16   [name], MD  tamsulosin (FLOMAX) 0.4 MG CAPS capsule TAKE 1 CAPSULE (0.4 MG TOTAL) BY MOUTH DAILY. 08/04/16   Camelia Eng Tysinger, PA-C  Vitamin D, Ergocalciferol, (DRISDOL) 50000 units CAPS capsule Take 1 capsule (50,000 Units total) by mouth every other day. 03/16/16   Thurnell Lose, MD  ZYTIGA 250 MG tablet TAKE FOUR TABLETS BY MOUTH ONE TIME DAILY ON AN EMPTY STOMACH 08/19/16   [name], MD     Vital Signs: BP (!) 150/87 (BP Location: Right Arm) Comment: RN notified  Pulse (!) 111 Comment: RN notified  Temp 98.1 F (36.7 C) (Oral)   Resp 18   SpO2 99%   Physical Exam Awake, alert. Chest clear to auscultation bilaterally. Heart with slightly tachycardic but regular rhythm. Abdomen soft, positive bowel sounds, nontender. Lower extremities with no edema.  Imaging: No results found.  Labs:  CBC:  Recent Labs  05/31/16 1104 06/28/16 1458 07/29/16 1122 09/28/16 1155  WBC 8.0 8.5 6.8 5.6  HGB 12.2* 12.5* 12.5* 12.6*  HCT 37.7* 37.9* 38.1* 38.0*  PLT 213 207 211 277    COAGS:  Recent Labs  01/21/16 0001  INR 1.29  APTT 26    BMP:  Recent Labs  03/13/16 0524 03/14/16 0416 03/15/16 0434 03/16/16 0454  05/31/16 1104 06/28/16 1458 07/29/16 1122 09/28/16 1155  NA 138 142 142 142  < > 141 142 144 142  K 3.7 3.4* 3.9 3.7  < > 3.8 4.3 4.0 2.7*  CL 108 111 112* 110  --   --   --   --   --   CO2 21* 22 24 23   < > 20* 22 24 25   GLUCOSE 141* 112* 103* 99  < > 97 109 100 102    BUN 22* 35* 32* 36*  < > 9.8 11.1 8.8 4.5*  CALCIUM 5.8* 6.4* 6.9* 7.3*  < > 7.8* 8.3* 8.1* 8.8  CREATININE 2.58* 2.74* 2.56* 2.40*  < > 0.8 0.8 0.8 0.7  GFRNONAA 24* 23* 25* 27*  --   --   --   --   --   GFRAA 28* 26* 28* 31*  --   --   --   --   --   < > = values in this interval not displayed.  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       Electronically Signed: D. Rowe Robert 10/19/2016, 1:16 PM   I spent a total of  20 minutes at the the patient's bedside AND on the patient's hospital floor or unit, greater than 50% of which was counseling/coordinating care for port a cath placement

## 2016-10-19 NOTE — Procedures (Signed)
Successful placement of right jugular portacath. Tip at SVC/RA junction.  Minimal blood loss and no immediate complication.   

## 2016-10-19 NOTE — Discharge Instructions (Signed)
Implanted Port Insertion, Care After °Refer to this sheet in the next few weeks. These instructions provide you with information on caring for yourself after your procedure. Your health care provider may also give you more specific instructions. Your treatment has been planned according to current medical practices, but problems sometimes occur. Call your health care provider if you have any problems or questions after your procedure. °WHAT TO EXPECT AFTER THE PROCEDURE °After your procedure, it is typical to have the following:  °· Discomfort at the port insertion site. Ice packs to the area will help. °· Bruising on the skin over the port. This will subside in 3-4 days. °HOME CARE INSTRUCTIONS °· After your port is placed, you will get a manufacturer's information card. The card has information about your port. Keep this card with you at all times.   °· Know what kind of port you have. There are many types of ports available.   °· Wear a medical alert bracelet in case of an emergency. This can help alert health care workers that you have a port.   °· The port can stay in for as long as your health care provider believes it is necessary.   °· A home health care nurse may give medicines and take care of the port.   °· You or a family member can get special training and directions for giving medicine and taking care of the port at home.   °SEEK MEDICAL CARE IF:  °· Your port does not flush or you are unable to get a blood return.   °· You have a fever or chills. °SEEK IMMEDIATE MEDICAL CARE IF: °· You have new fluid or pus coming from your incision.   °· You notice a bad smell coming from your incision site.   °· You have swelling, pain, or more redness at the incision or port site.   °· You have chest pain or shortness of breath. °This information is not intended to replace advice given to you by your health care provider. Make sure you discuss any questions you have with your health care provider. °Document  Released: 07/24/2013 Document Revised: 10/08/2013 Document Reviewed: 07/24/2013 °Elsevier Interactive Patient Education © 2017 Elsevier Inc. ° °Moderate Conscious Sedation, Adult, Care After °These instructions provide you with information about caring for yourself after your procedure. Your health care provider may also give you more specific instructions. Your treatment has been planned according to current medical practices, but problems sometimes occur. Call your health care provider if you have any problems or questions after your procedure. °What can I expect after the procedure? °After your procedure, it is common: °· To feel sleepy for several hours. °· To feel clumsy and have poor balance for several hours. °· To have poor judgment for several hours. °· To vomit if you eat too soon. °Follow these instructions at home: °For at least 24 hours after the procedure:  °· Do not: °¨ Participate in activities where you could fall or become injured. °¨ Drive. °¨ Use heavy machinery. °¨ Drink alcohol. °¨ Take sleeping pills or medicines that cause drowsiness. °¨ Make important decisions or sign legal documents. °¨ Take care of children on your own. °· Rest. °Eating and drinking °· Follow the diet recommended by your health care provider. °· If you vomit: °¨ Drink water, juice, or soup when you can drink without vomiting. °¨ Make sure you have little or no nausea before eating solid foods. °General instructions °· Have a responsible adult stay with you until you are awake and alert. °·   Take over-the-counter and prescription medicines only as told by your health care provider. °· If you smoke, do not smoke without supervision. °· Keep all follow-up visits as told by your health care provider. This is important. °Contact a health care provider if: °· You keep feeling nauseous or you keep vomiting. °· You feel light-headed. °· You develop a rash. °· You have a fever. °Get help right away if: °· You have trouble  breathing. °This information is not intended to replace advice given to you by your health care provider. Make sure you discuss any questions you have with your health care provider. °Document Released: 07/24/2013 Document Revised: 03/07/2016 Document Reviewed: 01/23/2016 °Elsevier Interactive Patient Education © 2017 Elsevier Inc. ° °

## 2016-10-21 ENCOUNTER — Other Ambulatory Visit (HOSPITAL_BASED_OUTPATIENT_CLINIC_OR_DEPARTMENT_OTHER): Payer: Medicare Other

## 2016-10-21 ENCOUNTER — Telehealth: Payer: Self-pay | Admitting: *Deleted

## 2016-10-21 ENCOUNTER — Ambulatory Visit: Payer: Medicare Other | Admitting: Nutrition

## 2016-10-21 ENCOUNTER — Telehealth: Payer: Self-pay | Admitting: Oncology

## 2016-10-21 ENCOUNTER — Ambulatory Visit (HOSPITAL_BASED_OUTPATIENT_CLINIC_OR_DEPARTMENT_OTHER): Payer: Medicare Other

## 2016-10-21 ENCOUNTER — Ambulatory Visit (HOSPITAL_BASED_OUTPATIENT_CLINIC_OR_DEPARTMENT_OTHER): Payer: Medicare Other | Admitting: Oncology

## 2016-10-21 VITALS — BP 107/94 | HR 87 | Temp 98.8°F | Resp 20

## 2016-10-21 VITALS — BP 116/74 | HR 89 | Temp 97.7°F | Resp 18 | Ht 70.0 in | Wt 177.4 lb

## 2016-10-21 DIAGNOSIS — Z5111 Encounter for antineoplastic chemotherapy: Secondary | ICD-10-CM

## 2016-10-21 DIAGNOSIS — E291 Testicular hypofunction: Secondary | ICD-10-CM | POA: Diagnosis not present

## 2016-10-21 DIAGNOSIS — C61 Malignant neoplasm of prostate: Secondary | ICD-10-CM | POA: Diagnosis present

## 2016-10-21 DIAGNOSIS — C7951 Secondary malignant neoplasm of bone: Secondary | ICD-10-CM

## 2016-10-21 DIAGNOSIS — C7931 Secondary malignant neoplasm of brain: Secondary | ICD-10-CM

## 2016-10-21 LAB — COMPREHENSIVE METABOLIC PANEL
ALT: 8 U/L (ref 0–55)
ANION GAP: 13 meq/L — AB (ref 3–11)
AST: 41 U/L — AB (ref 5–34)
Albumin: 3.5 g/dL (ref 3.5–5.0)
Alkaline Phosphatase: 299 U/L — ABNORMAL HIGH (ref 40–150)
BUN: 8 mg/dL (ref 7.0–26.0)
CALCIUM: 8.6 mg/dL (ref 8.4–10.4)
CO2: 20 meq/L — AB (ref 22–29)
CREATININE: 0.8 mg/dL (ref 0.7–1.3)
Chloride: 104 mEq/L (ref 98–109)
EGFR: >90 mL/min/{1.73_m2} (ref >90)
GLUCOSE: 80 mg/dL (ref 70–140)
Potassium: 4.5 mEq/L (ref 3.5–5.1)
Sodium: 137 mEq/L (ref 136–145)
Total Bilirubin: 0.43 mg/dL (ref 0.20–1.20)
Total Protein: 7.5 g/dL (ref 6.4–8.3)

## 2016-10-21 LAB — CBC WITH DIFFERENTIAL/PLATELET
BASO%: 0.7 % (ref 0.0–2.0)
Basophils Absolute: 0 10*3/uL (ref 0.0–0.1)
EOS ABS: 0.1 10*3/uL (ref 0.0–0.5)
EOS%: 1.4 % (ref 0.0–7.0)
HEMATOCRIT: 36.6 % — AB (ref 38.4–49.9)
HGB: 12.1 g/dL — ABNORMAL LOW (ref 13.0–17.1)
LYMPH#: 1.3 10*3/uL (ref 0.9–3.3)
LYMPH%: 25.2 % (ref 14.0–49.0)
MCH: 27.5 pg (ref 27.2–33.4)
MCHC: 32.9 g/dL (ref 32.0–36.0)
MCV: 83.6 fL (ref 79.3–98.0)
MONO#: 0.4 10*3/uL (ref 0.1–0.9)
MONO%: 8.6 % (ref 0.0–14.0)
NEUT#: 3.3 10*3/uL (ref 1.5–6.5)
NEUT%: 64.1 % (ref 39.0–75.0)
PLATELETS: 247 10*3/uL (ref 140–400)
RBC: 4.38 10*6/uL (ref 4.20–5.82)
RDW: 15.3 % — ABNORMAL HIGH (ref 11.0–14.6)
WBC: 5.1 10*3/uL (ref 4.0–10.3)

## 2016-10-21 MED ORDER — HEPARIN SOD (PORK) LOCK FLUSH 100 UNIT/ML IV SOLN
500.0000 [IU] | Freq: Once | INTRAVENOUS | Status: AC | PRN
  Administered 2016-10-21: 500 [IU]
  Filled 2016-10-21: qty 5

## 2016-10-21 MED ORDER — PROCHLORPERAZINE MALEATE 10 MG PO TABS: 10.0000 mg | ORAL_TABLET | Freq: Four times a day (QID) | ORAL | 1 refills | Status: DC | PRN

## 2016-10-21 MED ORDER — DEXAMETHASONE SODIUM PHOSPHATE 10 MG/ML IJ SOLN
INTRAMUSCULAR | Status: AC
  Filled 2016-10-21: qty 1

## 2016-10-21 MED ORDER — DOCETAXEL CHEMO INJECTION 160 MG/16ML
60.0000 mg/m2 | Freq: Once | INTRAVENOUS | Status: AC
  Administered 2016-10-21: 130 mg via INTRAVENOUS
  Filled 2016-10-21: qty 13

## 2016-10-21 MED ORDER — SODIUM CHLORIDE 0.9% FLUSH
10.0000 mL | INTRAVENOUS | Status: DC | PRN
  Administered 2016-10-21: 10 mL
  Filled 2016-10-21: qty 10

## 2016-10-21 MED ORDER — DEXAMETHASONE SODIUM PHOSPHATE 10 MG/ML IJ SOLN
10.0000 mg | Freq: Once | INTRAMUSCULAR | Status: AC
  Administered 2016-10-21: 10 mg via INTRAVENOUS

## 2016-10-21 MED ORDER — SODIUM CHLORIDE 0.9 % IV SOLN
Freq: Once | INTRAVENOUS | Status: AC
  Administered 2016-10-21: 10:00:00 via INTRAVENOUS

## 2016-10-21 NOTE — Telephone Encounter (Addendum)
Per 1/5 LOS I have scheduled appts and notified the scheduleer

## 2016-10-21 NOTE — Telephone Encounter (Signed)
Message sent to chemo scheduler to be added. Appointments scheduled per 10/21/16 los. Patient was given a copy of the AVS report and appointment schedule, per 10/21/16 los.

## 2016-10-21 NOTE — Patient Instructions (Signed)
[place] Discharge Instructions for Patients Receiving Chemotherapy  Today you received the following chemotherapy agent(s) docetaxel (Taxotere).  To help prevent nausea and vomiting after your treatment, we encourage you to take your nausea medication as directed by your MD.   If you develop nausea and vomiting that is not controlled by your nausea medication, call the clinic.   BELOW ARE SYMPTOMS THAT SHOULD BE REPORTED IMMEDIATELY:  *FEVER GREATER THAN 100.5 F  *CHILLS WITH OR WITHOUT FEVER  NAUSEA AND VOMITING THAT IS NOT CONTROLLED WITH YOUR NAUSEA MEDICATION  *UNUSUAL SHORTNESS OF BREATH  *UNUSUAL BRUISING OR BLEEDING  TENDERNESS IN MOUTH AND THROAT WITH OR WITHOUT PRESENCE OF ULCERS  *URINARY PROBLEMS  *BOWEL PROBLEMS  UNUSUAL RASH Items with * indicate a potential emergency and should be followed up as soon as possible.  Feel free to call the clinic you have any questions or concerns. The clinic phone number is [phone].  Please show the [place] at check-in to the Emergency Department and triage nurse.   Dexamethasone injection What is this medicine? DEXAMETHASONE (dex a METH a sone) is a corticosteroid. It is used to treat inflammation of the skin, joints, lungs, and other organs. Common conditions treated include asthma, allergies, and arthritis. It is also used for other conditions, like blood disorders and diseases of the adrenal glands. This medicine may be used for other purposes; ask your health care provider or pharmacist if you have questions. COMMON BRAND NAME(S): Decadron, DoubleDex, Simplist Dexamethasone, Solurex What should I tell my health care provider before I take this medicine? They need to know if you have any of these conditions: -blood clotting problems -Cushing's syndrome -diabetes -glaucoma -heart problems or disease -high blood pressure -infection like herpes, measles, tuberculosis, or chickenpox -kidney  disease -liver disease -mental problems -myasthenia gravis -osteoporosis -previous heart attack -seizures -stomach, ulcer or intestine disease including colitis and diverticulitis -thyroid problem -an unusual or allergic reaction to dexamethasone, corticosteroids, other medicines, lactose, foods, dyes, or preservatives -pregnant or trying to get pregnant -breast-feeding How should I use this medicine? This medicine is for injection into a muscle, joint, lesion, soft tissue, or vein. It is given by a health care professional in a hospital or clinic setting. Talk to your pediatrician regarding the use of this medicine in children. Special care may be needed. Overdosage: If you think you have taken too much of this medicine contact a poison control center or emergency room at once. NOTE: This medicine is only for you. Do not share this medicine with others. What if I miss a dose? This may not apply. If you are having a series of injections over a prolonged period, try not to miss an appointment. Call your doctor or health care professional to reschedule if you are unable to keep an appointment. What may interact with this medicine? Do not take this medicine with any of the following medications: -mifepristone, RU-486 -vaccines This medicine may also interact with the following medications: -amphotericin B -antibiotics like clarithromycin, erythromycin, and troleandomycin -aspirin and aspirin-like drugs -barbiturates like phenobarbital -carbamazepine -cholestyramine -cholinesterase inhibitors like donepezil, galantamine, rivastigmine, and tacrine -cyclosporine -digoxin -diuretics -ephedrine -male hormones, like estrogens or progestins and birth control pills -indinavir -isoniazid -ketoconazole -medicines for diabetes -medicines that improve muscle tone or strength for conditions like myasthenia gravis -NSAIDs, medicines for pain and inflammation, like ibuprofen or  naproxen -phenytoin -rifampin -thalidomide -warfarin This list may not describe all possible interactions. Give your health care provider a  list of all the medicines, herbs, non-prescription drugs, or dietary supplements you use. Also tell them if you smoke, drink alcohol, or use illegal drugs. Some items may interact with your medicine. What should I watch for while using this medicine? Your condition will be monitored carefully while you are receiving this medicine. If you are taking this medicine for a long time, carry an identification card with your name and address, the type and dose of your medicine, and your doctor's name and address. This medicine may increase your risk of getting an infection. Stay away from people who are sick. Tell your doctor or health care professional if you are around anyone with measles or chickenpox. Talk to your health care provider before you get any vaccines that you take this medicine. If you are going to have surgery, tell your doctor or health care professional that you have taken this medicine within the last twelve months. Ask your doctor or health care professional about your diet. You may need to lower the amount of salt you eat. The medicine can increase your blood sugar. If you are a diabetic check with your doctor if you need help adjusting the dose of your diabetic medicine. What side effects may I notice from receiving this medicine? Side effects that you should report to your doctor or health care professional as soon as possible: -allergic reactions like skin rash, itching or hives, swelling of the face, lips, or tongue -black or tarry stools -change in the amount of urine -changes in vision -confusion, excitement, restlessness, a false sense of well-being -fever, sore throat, sneezing, cough, or other signs of infection, wounds that will not heal -hallucinations -increased thirst -mental depression, mood swings, mistaken feelings of self  importance or of being mistreated -pain in hips, back, ribs, arms, shoulders, or legs -pain, redness, or irritation at the injection site -redness, blistering, peeling or loosening of the skin, including inside the mouth -rounding out of face -swelling of feet or lower legs -unusual bleeding or bruising -unusual tired or weak -wounds that do not heal Side effects that usually do not require medical attention (report to your doctor or health care professional if they continue or are bothersome): -diarrhea or constipation -change in taste -headache -nausea, vomiting -skin problems, acne, thin and shiny skin -touble sleeping -unusual growth of hair on the face or body -weight gain This list may not describe all possible side effects. Call your doctor for medical advice about side effects. You may report side effects to FDA at 1-800-FDA-1088. Where should I keep my medicine? This drug is given in a hospital or clinic and will not be stored at home. NOTE: This sheet is a summary. It may not cover all possible information. If you have questions about this medicine, talk to your doctor, pharmacist, or health care provider.  2017 Elsevier/Gold Standard (2008-01-24 14:04:12)   Docetaxel injection What is this medicine? DOCETAXEL (doe se TAX el) is a chemotherapy drug. It targets fast dividing cells, like cancer cells, and causes these cells to die. This medicine is used to treat many types of cancers like breast cancer, certain stomach cancers, head and neck cancer, lung cancer, and prostate cancer. This medicine may be used for other purposes; ask your health care provider or pharmacist if you have questions. COMMON BRAND NAME(S): Docefrez, Taxotere What should I tell my health care provider before I take this medicine? They need to know if you have any of these conditions: -infection (especially a virus infection such  as chickenpox, cold sores, or herpes) -liver disease -low blood counts,  like low white cell, platelet, or red cell counts -an unusual or allergic reaction to docetaxel, polysorbate 80, other chemotherapy agents, other medicines, foods, dyes, or preservatives -pregnant or trying to get pregnant -breast-feeding How should I use this medicine? This drug is given as an infusion into a vein. It is administered in a hospital or clinic by a specially trained health care professional. Talk to your pediatrician regarding the use of this medicine in children. Special care may be needed. Overdosage: If you think you have taken too much of this medicine contact a poison control center or emergency room at once. NOTE: This medicine is only for you. Do not share this medicine with others. What if I miss a dose? It is important not to miss your dose. Call your doctor or health care professional if you are unable to keep an appointment. What may interact with this medicine? -cyclosporine -erythromycin -ketoconazole -medicines to increase blood counts like filgrastim, pegfilgrastim, sargramostim -vaccines Talk to your doctor or health care professional before taking any of these medicines: -acetaminophen -aspirin -ibuprofen -ketoprofen -naproxen This list may not describe all possible interactions. Give your health care provider a list of all the medicines, herbs, non-prescription drugs, or dietary supplements you use. Also tell them if you smoke, drink alcohol, or use illegal drugs. Some items may interact with your medicine. What should I watch for while using this medicine? Your condition will be monitored carefully while you are receiving this medicine. You will need important blood work done while you are taking this medicine. This drug may make you feel generally unwell. This is not uncommon, as chemotherapy can affect healthy cells as well as cancer cells. Report any side effects. Continue your course of treatment even though you feel ill unless your doctor tells you to  stop. In some cases, you may be given additional medicines to help with side effects. Follow all directions for their use. Call your doctor or health care professional for advice if you get a fever, chills or sore throat, or other symptoms of a cold or flu. Do not treat yourself. This drug decreases your body's ability to fight infections. Try to avoid being around people who are sick. This medicine may increase your risk to bruise or bleed. Call your doctor or health care professional if you notice any unusual bleeding. This medicine may contain alcohol in the product. You may get drowsy or dizzy. Do not drive, use machinery, or do anything that needs mental alertness until you know how this medicine affects you. Do not stand or sit up quickly, especially if you are an older patient. This reduces the risk of dizzy or fainting spells. Avoid alcoholic drinks. Do not become pregnant while taking this medicine. Women should inform their doctor if they wish to become pregnant or think they might be pregnant. There is a potential for serious side effects to an unborn child. Talk to your health care professional or pharmacist for more information. Do not breast-feed an infant while taking this medicine. What side effects may I notice from receiving this medicine? Side effects that you should report to your doctor or health care professional as soon as possible: -allergic reactions like skin rash, itching or hives, swelling of the face, lips, or tongue -low blood counts - This drug may decrease the number of white blood cells, red blood cells and platelets. You may be at increased risk for infections and  bleeding. -signs of infection - fever or chills, cough, sore throat, pain or difficulty passing urine -signs of decreased platelets or bleeding - bruising, pinpoint red spots on the skin, black, tarry stools, nosebleeds -signs of decreased red blood cells - unusually weak or tired, fainting spells,  lightheadedness -breathing problems -fast or irregular heartbeat -low blood pressure -mouth sores -nausea and vomiting -pain, swelling, redness or irritation at the injection site -pain, tingling, numbness in the hands or feet -swelling of the ankle, feet, hands -weight gain Side effects that usually do not require medical attention (report to your doctor or health care professional if they continue or are bothersome): -bone pain -complete hair loss including hair on your head, underarms, pubic hair, eyebrows, and eyelashes -diarrhea -excessive tearing -changes in the color of fingernails -loosening of the fingernails -nausea -muscle pain -red flush to skin -sweating -weak or tired This list may not describe all possible side effects. Call your doctor for medical advice about side effects. You may report side effects to FDA at 1-800-FDA-1088. Where should I keep my medicine? This drug is given in a hospital or clinic and will not be stored at home. NOTE: This sheet is a summary. It may not cover all possible information. If you have questions about this medicine, talk to your doctor, pharmacist, or health care provider.  2017 Elsevier/Gold Standard (2015-11-05 12:32:56)

## 2016-10-21 NOTE — Progress Notes (Signed)
Pt receiving first-time taxotere today. Chemo consent signed and witnessed. Pt and son available for discussion of port maintenance,  EMLA cream use, and typical infusion room flow. Pt and son verbalized understanding. Barb, Nutritionist at chairside to discuss need for pt to increase caloric intake and maintain or increase weight. Pt verbalized having difficulty eating d/t lack of appetite. This symptom is baseline for pt prior to receiving chemotherapy today. No other complaints at this time.   Pt also visited by SW to discuss living and financial situation.  Educated pt on potential side effects and when to call [facility] versus visit the ED. Pt and son verbalized understanding. In-basket placed for f/u phone call on Monday 10/24/16 and Blue chemo card given to pt.   Pt VSS throughout taxotere infusion and pt educated to take compazine this evening before bed to prevent nausea, and able to take every 6 hours as needed if nausea occurs. Pt and son verbalized understanding. AVS printed and pt asymptomatic upon d/c.

## 2016-10-21 NOTE — Progress Notes (Signed)
Hematology and Oncology Follow Up Visit  SEVAN FINSETH Sr. [id number] 1951/07/08 66 y.o. 10/21/2016 9:30 AM   Principle Diagnosis: 66 year old gentleman with prostate cancer presented with diffuse bony metastasis and lymphadenopathy. His PSA is 899 with unknown Gleason score. His diagnosis was confirmed in June 2016. 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   Prior Therapy:  He is status post a biopsy obtained of the soft tissue in the retroperitoneal area on 04/03/2015. He is status post whole brain radiation for a total of 30 gray in 10 fractions completed in April 2017. Zytiga 1000 mg started in April 2017. Therapy discontinued in December 2017 because of progression of disease.   Current therapy:  Lupron 22.5 mg IM every 3 months started in June 2016.  Xgeva to start on 07/24/2015.  Taxotere chemotherapy with cycle one scheduled to start on 10/21/2016. He will receive 60 mg/m every 3 weeks for better tolerance.  Interim History:  Mr. Vaswani presents today for a follow-up visit with his son. Since the last visit, he reports further decline in his appetite and weight loss. He denied any bone pain, back pain or pathological fractures. He is ambulating without major difficulties. He has not reported any falls or syncope. He denied any headaches or neurological deficits. He denied any visual changes or recent hospitalizations.  He stopped taking Zytiga in preparation to start systemic chemotherapy. Port-A-Cath was inserted without any complications.   He does not report any headaches, blurry vision, syncope or seizures. He does not report any fevers, chills, sweats. He does not report any chest pain, palpitation, orthopnea or leg edema. He does not report any cough, shortness of breath, dyspnea on exertion, wheezing or hemoptysis. He does not report any nausea, vomiting, abdominal pain, hematochezia, or early satiety. He does  not report any hematuria or dysuria. He does report frequency, hesitancy and nocturia. He does not report any skeletal complaints of arthralgias or myalgias. Remaining review of systems unremarkable.  Medications: I have reviewed the patient's current medications.  Current Outpatient Prescriptions  Medication Sig Dispense Refill  . amLODipine (NORVASC) 10 MG tablet TAKE 1 TABLET EVERY DAY 90 tablet 0  . Aspirin-Caffeine (BC FAST PAIN RELIEF PO) Take 1 Package by mouth as needed (pain). Reported on 03/21/2016    . calcitRIOL (ROCALTROL) 0.5 MCG capsule TAKE 1 CAPSULE TWICE A DAY 180 capsule 0  . calcium carbonate (TUMS - DOSED IN MG ELEMENTAL CALCIUM) 500 MG chewable tablet Chew 5 tablets (1,000 mg of elemental calcium total) by mouth 3 (three) times daily. 90 tablet 0  . [facility] OYSTER SHELL CALCIUM+VIT D 500-125 MG-UNIT TABS TAKE 1 TABLET BY MOUTH TWICE A DAY 60 tablet 3  . dexamethasone (DECADRON) 2 MG tablet Take 1 tablet (2 mg total) by mouth daily. 30 tablet 0  . finasteride (PROSCAR) 5 MG tablet Take 1 tablet (5 mg total) by mouth daily. 90 tablet 1  . KLOR-CON M20 20 MEQ tablet Take 1 tablet (20 mEq total) by mouth 2 (two) times daily. 60 tablet 0  . lidocaine (XYLOCAINE) 5 % ointment Apply 1 application topically as needed. 30 g 1  . losartan (COZAAR) 100 MG tablet Take 100 mg by mouth daily.  3  . omeprazole (PRILOSEC) 40 MG capsule TAKE 1 CAPSULE (40 MG TOTAL) BY MOUTH DAILY. 90 capsule 1  . prochlorperazine (COMPAZINE) 10 MG tablet Take 1 tablet (10 mg total) by mouth every 6 (six) hours as needed for  nausea or vomiting. 30 tablet 1  . tamsulosin (FLOMAX) 0.4 MG CAPS capsule TAKE 1 CAPSULE (0.4 MG TOTAL) BY MOUTH DAILY. 30 capsule 2  . Vitamin D, Ergocalciferol, (DRISDOL) 50000 units CAPS capsule Take 1 capsule (50,000 Units total) by mouth every other day. 30 capsule 0   No current facility-administered medications for this visit.      Allergies:  Allergies  Allergen Reactions  .  Losartan Potassium-Hctz Other (See Comments)    dizziness    Past Medical History, Surgical history, Social history, and Family History were reviewed and updated.   Physical Exam: Blood pressure 116/74, pulse 89, temperature 97.7 F (36.5 C), temperature source Oral, resp. rate 18, height 5\' 10"  (1.778 m), weight 177 lb 6.4 oz (80.5 kg), SpO2 100 %. ECOG: 1 General appearance: Alert, awake chronically ill-appearing gentleman without distress.. Head: Normocephalic, without obvious abnormality no oral ulcers or lesions.. Neck: no adenopathy thyroid masses. Lymph nodes: Cervical, supraclavicular, and axillary nodes normal. Heart:regular rate and rhythm, S1, S2 normal, no murmur, click, rub or gallop Lung:chest clear, no wheezing, rales, normal symmetric air entry Abdomin: soft, non-tender, without masses or organomegaly no shifting dullness or ascites. EXT:no erythema, induration, or clubbing.  Neurological examination: No deficits noted.  Lab Results: Lab Results  Component Value Date   WBC 5.1 10/21/2016   HGB 12.1 (L) 10/21/2016   HCT 36.6 (L) 10/21/2016   MCV 83.6 10/21/2016   PLT 247 10/21/2016     Chemistry      Component Value Date/Time   NA 139 10/19/2016 1311   NA 142 09/28/2016 1155   K 4.5 10/19/2016 1311   K 2.7 (LL) 09/28/2016 1155   CL 106 10/19/2016 1311   CO2 21 (L) 10/19/2016 1311   CO2 25 09/28/2016 1155   BUN 8 10/19/2016 1311   BUN 4.5 (L) 09/28/2016 1155   CREATININE 0.75 10/19/2016 1311   CREATININE 0.7 09/28/2016 1155      Component Value Date/Time   CALCIUM 8.8 (L) 10/19/2016 1311   CALCIUM 8.8 09/28/2016 1155   ALKPHOS 339 (H) 09/28/2016 1155   AST 38 (H) 09/28/2016 1155   ALT 12 09/28/2016 1155   BILITOT 0.76 09/28/2016 1155       Results for KHALI, WESTFAHL SR. (MRN [id number]) as of 10/21/2016 09:17  Ref. Range 07/29/2016 11:22 09/28/2016 11:55  PSA Latest Ref Range: 0.0 - 4.0 ng/mL 39.8 (H) 114.8 (H)       Impression and  Plan:  66 year old gentleman with the following issues:  1. Advanced prostate cancer presenting with diffuse bony metastasis and bulky lymphadenopathy and PSA of 899.8. This is biopsy proven to be prostate cancer.   He started in June 2016 but have developed castration-resistant disease with a PSA rise up to 702.5 As well as brain metastasis.  He is status post of Zytiga therapy which was discontinued in December 2017 because of progression of disease. His PSA was up to 114.8.  Risks and benefits of systemic chemotherapy were reviewed again with the patient and his son. Complications from Taxotere include nausea, vomiting, myelosuppression, neutropenia, neutropenic sepsis among others. He will receive reduced dose for better tolerance and ready to proceed.  2. Bone directed therapy: He received Xgeva without any major complications. He will receive this with every other chemotherapy cycle.  3. Dural/epidural mass: He is status post whole brain radiation without any residual complications. He does not report any new neurological deficits or symptoms.  4. Pain: Better controlled at this  time.  5. Prognosis: He does have incurable malignancy that has been palliated effectively at this time. His performance status remains excellent and recommend continuing aggressive treatment.  6. Androgen deprivation: He will continue on Lupron 22.5 mg every 3 months and he will receive his next injection in January 2018.  7. IV access: Port-A-Cath inserted without any complications.  8. Antiemetics: Prescription for Compazine will be made available to the patient before the start of chemotherapy.  9. Neutropenia prophylaxis: He will receive Neulasta after each chemotherapy cycle.  7. Follow-up: Will be in 3 weeks for the next cycle of chemotherapy.     [id number], MD 1/5/20189:30 AM

## 2016-10-21 NOTE — Progress Notes (Signed)
66 year old male diagnosed with metastatic prostate cancer.  He is a patient of Dr. [name].  Past medical history includes obesity and hypertension.  Medications includeTums, calcium with vitamin D, Decadron, Prilosec, and Compazine.  Labs were reviewed.  Height: 5 feet 11 inches. Weight: 177.4 pounds January 5. Usual body weight: 221 pounds in May 2017. BMI: 25.45  Spoke with patient during chemotherapy.  Patient's son was with him. Patient reports decreased appetite and intolerance to salty foods. Patient has dentures, however they don't fit since he has lost so much weight. Patient denies problems chewing and swallowing. Reports he thinks ensure gave him diarrhea but he cannot be sure. Patient reports he eats his meals with his sister.  Nutrition diagnosis:  Inadequate oral intake related to metastatic prostate cancer as evidenced by 20% weight loss over 7 months.  Intervention: I educated patient to consume small frequent meals with high-calorie, high-protein foods. I provided him with a fact sheet. Recommended patient try oral nutrition supplements and if tolerated, drink 2-3 daily between meals. Samples were provided. Questions were answered.  Teach back method used.  Contact information given.  Monitoring, evaluation, goals:  Patient will work to increase calories and protein to minimize further weight loss.  Next visit: Friday, January 26 during infusion.  **Disclaimer: This note was dictated with voice recognition software. Similar sounding words can inadvertently be transcribed and this note may contain transcription errors which may not have been corrected upon publication of note.**

## 2016-10-22 ENCOUNTER — Ambulatory Visit: Payer: Medicare Other

## 2016-10-22 ENCOUNTER — Other Ambulatory Visit: Payer: Self-pay | Admitting: Medical

## 2016-10-22 ENCOUNTER — Ambulatory Visit (HOSPITAL_BASED_OUTPATIENT_CLINIC_OR_DEPARTMENT_OTHER): Payer: Medicare Other

## 2016-10-22 VITALS — BP 112/72 | HR 100 | Temp 97.9°F | Resp 18

## 2016-10-22 DIAGNOSIS — C61 Malignant neoplasm of prostate: Secondary | ICD-10-CM | POA: Diagnosis not present

## 2016-10-22 DIAGNOSIS — C7951 Secondary malignant neoplasm of bone: Secondary | ICD-10-CM | POA: Diagnosis not present

## 2016-10-22 LAB — PSA: Prostate Specific Ag, Serum: 201.5 ng/mL — ABNORMAL HIGH (ref 0.0–4.0)

## 2016-10-22 MED ORDER — PEGFILGRASTIM INJECTION 6 MG/0.6ML ~~LOC~~
6.0000 mg | PREFILLED_SYRINGE | Freq: Once | SUBCUTANEOUS | Status: AC
  Administered 2016-10-22: 6 mg via SUBCUTANEOUS

## 2016-10-22 NOTE — Patient Instructions (Signed)
Pegfilgrastim injection What is this medicine? PEGFILGRASTIM (PEG fil gra stim) is a long-acting granulocyte colony-stimulating factor that stimulates the growth of neutrophils, a type of white blood cell important in the body's fight against infection. It is used to reduce the incidence of fever and infection in patients with certain types of cancer who are receiving chemotherapy that affects the bone marrow, and to increase survival after being exposed to high doses of radiation. This medicine may be used for other purposes; ask your health care provider or pharmacist if you have questions. COMMON BRAND NAME(S): Neulasta What should I tell my health care provider before I take this medicine? They need to know if you have any of these conditions: -kidney disease -latex allergy -ongoing radiation therapy -sickle cell disease -skin reactions to acrylic adhesives (On-Body Injector only) -an unusual or allergic reaction to pegfilgrastim, filgrastim, other medicines, foods, dyes, or preservatives -pregnant or trying to get pregnant -breast-feeding How should I use this medicine? This medicine is for injection under the skin. If you get this medicine at home, you will be taught how to prepare and give the pre-filled syringe or how to use the On-body Injector. Refer to the patient Instructions for Use for detailed instructions. Use exactly as directed. Take your medicine at regular intervals. Do not take your medicine more often than directed. It is important that you put your used needles and syringes in a special sharps container. Do not put them in a trash can. If you do not have a sharps container, call your pharmacist or healthcare provider to get one. Talk to your pediatrician regarding the use of this medicine in children. While this drug may be prescribed for selected conditions, precautions do apply. Overdosage: If you think you have taken too much of this medicine contact a poison control  center or emergency room at once. NOTE: This medicine is only for you. Do not share this medicine with others. What if I miss a dose? It is important not to miss your dose. Call your doctor or health care professional if you miss your dose. If you miss a dose due to an On-body Injector failure or leakage, a new dose should be administered as soon as possible using a single prefilled syringe for manual use. What may interact with this medicine? Interactions have not been studied. Give your health care provider a list of all the medicines, herbs, non-prescription drugs, or dietary supplements you use. Also tell them if you smoke, drink alcohol, or use illegal drugs. Some items may interact with your medicine. This list may not describe all possible interactions. Give your health care provider a list of all the medicines, herbs, non-prescription drugs, or dietary supplements you use. Also tell them if you smoke, drink alcohol, or use illegal drugs. Some items may interact with your medicine. What should I watch for while using this medicine? You may need blood work done while you are taking this medicine. If you are going to need a MRI, CT scan, or other procedure, tell your doctor that you are using this medicine (On-Body Injector only). What side effects may I notice from receiving this medicine? Side effects that you should report to your doctor or health care professional as soon as possible: -allergic reactions like skin rash, itching or hives, swelling of the face, lips, or tongue -dizziness -fever -pain, redness, or irritation at site where injected -pinpoint red spots on the skin -red or dark-brown urine -shortness of breath or breathing problems -stomach or   side pain, or pain at the shoulder -swelling -tiredness -trouble passing urine or change in the amount of urine Side effects that usually do not require medical attention (report to your doctor or health care professional if they  continue or are bothersome): -bone pain -muscle pain This list may not describe all possible side effects. Call your doctor for medical advice about side effects. You may report side effects to FDA at 1-800-FDA-1088. Where should I keep my medicine? Keep out of the reach of children. Store pre-filled syringes in a refrigerator between 2 and 8 degrees C (36 and 46 degrees F). Do not freeze. Keep in carton to protect from light. Throw away this medicine if it is left out of the refrigerator for more than 48 hours. Throw away any unused medicine after the expiration date. NOTE: This sheet is a summary. It may not cover all possible information. If you have questions about this medicine, talk to your doctor, pharmacist, or health care provider.  2017 Elsevier/Gold Standard (2014-10-23 14:30:14)  

## 2016-10-23 ENCOUNTER — Other Ambulatory Visit: Payer: Self-pay | Admitting: Oncology

## 2016-10-23 DIAGNOSIS — C61 Malignant neoplasm of prostate: Secondary | ICD-10-CM

## 2016-10-24 ENCOUNTER — Encounter: Payer: Self-pay | Admitting: *Deleted

## 2016-10-25 NOTE — Progress Notes (Signed)
[facility] Clinical Social Work  Clinical Social Work very familiar with patient, met with patient and patient's son after infusion last week and again individually today.  Mr. Lottes shared he has no heat in his home and is currently staying in a motel.  The patient's son is helping him find a new apartment, however, per son's report this may take some time.  CSW explored possible options for housing- patient unable to stay with family and cannot afford to continue paying for motel long-term.  CSW enrolled patient in [employer] project until he is able to obtain safe permanent housing.  CSW will continue to support patient and family throughout cancer journey.  Lauren Mullis, MSW, LCSW, OSW-C Clinical Social Worker [facility] [phone]        

## 2016-10-29 ENCOUNTER — Other Ambulatory Visit: Payer: Self-pay | Admitting: Medical

## 2016-10-31 ENCOUNTER — Other Ambulatory Visit: Payer: Self-pay | Admitting: Medical

## 2016-11-03 ENCOUNTER — Other Ambulatory Visit: Payer: Self-pay | Admitting: Medical

## 2016-11-04 ENCOUNTER — Encounter: Payer: Self-pay | Admitting: *Deleted

## 2016-11-04 NOTE — Telephone Encounter (Signed)
Is this okay to refill? 

## 2016-11-04 NOTE — Progress Notes (Signed)
[place] Social Work  Clinical Social Work was referred by [place] to review and complete healthcare advance directives.  Clinical Social Worker met with patient  in [place] office.  The patient designated son, Jaris, Kohles.,  as their primary healthcare agent and sister, Azhar Knope, as their secondary agent.  Patient also completed healthcare living will.    Clinical Social Worker notarized documents and made copies for patient/family. Clinical Social Worker will send documents to medical records to be scanned into patient's chart. Clinical Social Worker encouraged patient/family to contact with any additional questions or concerns.  Polo Riley, MSW, [place] Worker [facility] [phone]

## 2016-11-08 ENCOUNTER — Emergency Department (HOSPITAL_COMMUNITY)
Admission: EM | Admit: 2016-11-08 | Discharge: 2016-11-08 | Disposition: A | Discharge status: Home / Self Care | Payer: Medicare Other | Attending: Emergency Medicine | Admitting: Emergency Medicine

## 2016-11-08 ENCOUNTER — Encounter (HOSPITAL_COMMUNITY): Payer: Self-pay | Admitting: Emergency Medicine

## 2016-11-08 ENCOUNTER — Emergency Department (HOSPITAL_COMMUNITY): Payer: Medicare Other

## 2016-11-08 DIAGNOSIS — M25552 Pain in left hip: Secondary | ICD-10-CM | POA: Insufficient documentation

## 2016-11-08 DIAGNOSIS — D649 Anemia, unspecified: Secondary | ICD-10-CM | POA: Diagnosis not present

## 2016-11-08 DIAGNOSIS — Z85841 Personal history of malignant neoplasm of brain: Secondary | ICD-10-CM | POA: Insufficient documentation

## 2016-11-08 DIAGNOSIS — Z8546 Personal history of malignant neoplasm of prostate: Secondary | ICD-10-CM | POA: Insufficient documentation

## 2016-11-08 DIAGNOSIS — Z85830 Personal history of malignant neoplasm of bone: Secondary | ICD-10-CM | POA: Insufficient documentation

## 2016-11-08 DIAGNOSIS — N390 Urinary tract infection, site not specified: Secondary | ICD-10-CM | POA: Diagnosis not present

## 2016-11-08 DIAGNOSIS — G8929 Other chronic pain: Secondary | ICD-10-CM | POA: Insufficient documentation

## 2016-11-08 DIAGNOSIS — Z87891 Personal history of nicotine dependence: Secondary | ICD-10-CM | POA: Diagnosis not present

## 2016-11-08 DIAGNOSIS — R300 Dysuria: Secondary | ICD-10-CM | POA: Diagnosis present

## 2016-11-08 LAB — URINALYSIS, ROUTINE W REFLEX MICROSCOPIC
BROAD CASTS UA: 6
Bacteria, UA: NONE SEEN
Bilirubin Urine: NEGATIVE
Glucose, UA: NEGATIVE mg/dL
Hgb urine dipstick: NEGATIVE
Ketones, ur: NEGATIVE mg/dL
Nitrite: NEGATIVE
PH: 5 (ref 5.0–8.0)
Protein, ur: NEGATIVE mg/dL
SPECIFIC GRAVITY, URINE: 1.019 (ref 1.005–1.030)

## 2016-11-08 LAB — COMPREHENSIVE METABOLIC PANEL
ALK PHOS: 243 U/L — AB (ref 38–126)
ALT: 10 U/L — AB (ref 17–63)
AST: 26 U/L (ref 15–41)
Albumin: 3.4 g/dL — ABNORMAL LOW (ref 3.5–5.0)
Anion gap: 6 (ref 5–15)
BILIRUBIN TOTAL: 0.8 mg/dL (ref 0.3–1.2)
BUN: 11 mg/dL (ref 6–20)
CALCIUM: 8 mg/dL — AB (ref 8.9–10.3)
CO2: 23 mmol/L (ref 22–32)
Chloride: 108 mmol/L (ref 101–111)
Creatinine, Ser: 0.87 mg/dL (ref 0.61–1.24)
Glucose, Bld: 94 mg/dL (ref 65–99)
Potassium: 4.3 mmol/L (ref 3.5–5.1)
Sodium: 137 mmol/L (ref 135–145)
TOTAL PROTEIN: 6.5 g/dL (ref 6.5–8.1)

## 2016-11-08 LAB — CBC WITH DIFFERENTIAL/PLATELET
Basophils Absolute: 0.1 10*3/uL (ref 0.0–0.1)
Basophils Relative: 1 %
Eosinophils Absolute: 0 10*3/uL (ref 0.0–0.7)
Eosinophils Relative: 0 %
HCT: 29.4 % — ABNORMAL LOW (ref 39.0–52.0)
Hemoglobin: 9.9 g/dL — ABNORMAL LOW (ref 13.0–17.0)
Lymphocytes Relative: 20 %
Lymphs Abs: 1.5 10*3/uL (ref 0.7–4.0)
MCH: 27 pg (ref 26.0–34.0)
MCHC: 33.7 g/dL (ref 30.0–36.0)
MCV: 80.1 fL (ref 78.0–100.0)
MONO ABS: 0.6 10*3/uL (ref 0.1–1.0)
MONOS PCT: 8 %
NEUTROS ABS: 5.2 10*3/uL (ref 1.7–7.7)
NEUTROS PCT: 71 %
PLATELETS: 224 10*3/uL (ref 150–400)
RBC: 3.67 MIL/uL — AB (ref 4.22–5.81)
RDW: 15.6 % — ABNORMAL HIGH (ref 11.5–15.5)
WBC: 7.4 10*3/uL (ref 4.0–10.5)

## 2016-11-08 MED ORDER — DEXTROSE 5 % IV SOLN
1.0000 g | Freq: Once | INTRAVENOUS | Status: AC
  Administered 2016-11-08: 1 g via INTRAVENOUS
  Filled 2016-11-08: qty 10

## 2016-11-08 MED ORDER — SODIUM CHLORIDE 0.9 % IV BOLUS (SEPSIS)
1000.0000 mL | Freq: Once | INTRAVENOUS | Status: AC
  Administered 2016-11-08: 1000 mL via INTRAVENOUS

## 2016-11-08 MED ORDER — LEVOFLOXACIN 750 MG PO TABS: 750.0000 mg | ORAL_TABLET | Freq: Every day | ORAL | 0 refills | Status: AC

## 2016-11-08 MED ORDER — HYDROCODONE-ACETAMINOPHEN 5-325 MG PO TABS: 1.0000 | ORAL_TABLET | ORAL | 0 refills | Status: DC | PRN

## 2016-11-08 MED ORDER — HYDROCODONE-ACETAMINOPHEN 5-325 MG PO TABS
2.0000 | ORAL_TABLET | Freq: Once | ORAL | Status: AC
  Administered 2016-11-08: 2 via ORAL
  Filled 2016-11-08: qty 2

## 2016-11-08 NOTE — Discharge Instructions (Addendum)
Your blood work showed mild anemia (low blood level). Call your oncologist to discuss and repeat labs in one week.

## 2016-11-08 NOTE — ED Triage Notes (Signed)
Pt reports dysuria x 2-3 days ago, hip pain x 1 week. sts last chemo 3 weeks ago. Alert and oriented x 4.

## 2016-11-08 NOTE — ED Provider Notes (Signed)
[facility] DEPT Provider Note   CSN: [id number] Arrival date & time: 11/08/16  1735     History   Chief Complaint Chief Complaint  Patient presents with  . Dysuria  . Hip Pain    left     HPI Steven Church Sr. is a 66 y.o. male.  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 Never had similar sx. He also has intermittent dysuria. No flank pain, nausea, vomiting, or fevers. No h/o UTI or STDs. Denies penile discharge. No new meds. No history of kidney stones.  Hip pain: Pt has chronic L hip pain 2/2 known bony disease. He states that since his urine has started to bother him, he has mild acute on chronic worsening. Describes pain as aching, throbbing, worse with movement and weight bearing. No recent trauma. No numbness or weakness.  Past Medical History:  Diagnosis Date  . Bone cancer ([place])   . Brain cancer ([place])   . Family history of cancer   . Former smoker   . GERD (gastroesophageal reflux disease)   . Hypertension   . Obesity   . Prostate cancer ([place]) 04/07/2015   bone mets, started oncology 03/2015    Patient Active Problem List   Diagnosis Date Noted  . Advanced care planning/counseling discussion   . Dysarthria 03/11/2016  . Acute encephalopathy 03/11/2016  . Chronic anemia 03/11/2016  . CAP (community acquired pneumonia) 03/11/2016  . Hypokalemia   . Hypomagnesemia   . HCAP (healthcare-associated pneumonia)   . DNR (do not resuscitate) discussion   . Aphasia   . Prostate cancer metastatic to bone ([place])   . Palliative care encounter   . Brain metastasis ([place]) 01/25/2016  . Prostate cancer ([place]) 04/07/2015  . Retroperitoneal lymphadenopathy   . Mediastinal lymphadenopathy 03/12/2015  . Lung mass 03/09/2015  . Essential hypertension 07/11/2014  . Rhinitis, allergic 07/11/2014     Past Surgical History:  Procedure Laterality Date  . COLONOSCOPY     never  . IR GENERIC HISTORICAL  10/19/2016   IR FLUORO GUIDE PORT INSERTION RIGHT 10/19/2016 Markus Daft, MD [facility]  . IR GENERIC HISTORICAL  10/19/2016   IR US GUIDE VASC ACCESS RIGHT 10/19/2016 Markus Daft, MD [facility]       Home Medications    Prior to Admission medications   Medication Sig Start Date End Date Taking? Authorizing Provider  calcitRIOL (ROCALTROL) 0.5 MCG capsule TAKE 1 CAPSULE TWICE A DAY 11/04/16  Yes Camelia Eng Tysinger, PA-C  [facility] OYSTER SHELL CALCIUM+VIT D 500-125 MG-UNIT TABS TAKE 1 TABLET BY MOUTH TWICE A DAY 08/02/16  Yes Camelia Eng Tysinger, PA-C  finasteride (PROSCAR) 5 MG tablet Take 1 tablet (5 mg total) by mouth daily. 04/20/16  Yes David S Tysinger, PA-C  KLOR-CON M20 20 MEQ tablet TAKE 1 TABLET BY MOUTH 2 TIMES DAILY. 10/24/16  Yes [name], MD  lidocaine (XYLOCAINE) 5 % ointment Apply 1 application topically as needed. 10/05/16  Yes [name], MD  omeprazole (PRILOSEC) 40 MG capsule TAKE 1 CAPSULE (40 MG TOTAL) BY MOUTH DAILY. 10/31/16  Yes Camelia Eng Tysinger, PA-C  prochlorperazine (COMPAZINE) 10 MG tablet Take 1 tablet (10 mg total) by mouth every 6 (six) hours as needed for nausea or vomiting. 10/21/16  Yes [name], MD  tamsulosin (FLOMAX) 0.4 MG  CAPS capsule TAKE ONE CAPSULE BY MOUTH EVERY DAY 10/24/16  Yes Camelia Eng Tysinger, PA-C  amLODipine (NORVASC) 10 MG tablet TAKE 1 TABLET BY MOUTH EVERY DAY 11/09/16   Camelia Eng Tysinger, PA-C  calcium carbonate (TUMS - DOSED IN MG ELEMENTAL CALCIUM) 500 MG chewable tablet Chew 5 tablets (1,000 mg of elemental calcium total) by mouth 3 (three) times daily. Patient not taking: Reported on 11/08/2016 03/16/16   Steven Lose, MD  dexamethasone (DECADRON) 2 MG tablet Take 1 tablet (2 mg total) by mouth daily. Patient not taking: Reported on 11/08/2016 04/26/16   Steven Pedro, PA-C  HYDROcodone-acetaminophen (NORCO/VICODIN) 5-325 MG  tablet Take 1-2 tablets by mouth every 4 (four) hours as needed for moderate pain or severe pain. 11/08/16   Steven Bruce, MD  levofloxacin (LEVAQUIN) 750 MG tablet Take 1 tablet (750 mg total) by mouth daily. 11/08/16 11/15/16  Steven Bruce, MD  Vitamin D, Ergocalciferol, (DRISDOL) 50000 units CAPS capsule Take 1 capsule (50,000 Units total) by mouth every other day. Patient not taking: Reported on 11/08/2016 03/16/16   Steven Lose, MD    Family History Family History  Problem Relation Age of Onset  . Hypertension Mother   . Other Mother     died of old age  . Diabetes Father     died of diabetic infection complications  . Cancer Sister     lung  . Alcohol abuse Brother   . Obesity Sister   . Hypertension Brother     Social History Social History  Substance Use Topics  . Smoking status: Former Smoker    Packs/day: 1.00    Years: 18.00    Types: Cigarettes  . Smokeless tobacco: Never Used  . Alcohol use No     Allergies   Losartan potassium-hctz   Review of Systems Review of Systems  Constitutional: Positive for fatigue. Negative for chills and fever.  HENT: Negative for congestion and rhinorrhea.   Eyes: Negative for visual disturbance.  Respiratory: Negative for cough, shortness of breath and wheezing.   Cardiovascular: Negative for chest pain and leg swelling.  Gastrointestinal: Negative for abdominal pain, diarrhea, nausea and vomiting.  Genitourinary: Positive for decreased urine volume, difficulty urinating, dysuria and frequency. Negative for flank pain.  Musculoskeletal: Positive for arthralgias. Negative for neck pain and neck stiffness.  Skin: Negative for rash and wound.  Allergic/Immunologic: Negative for immunocompromised state.  Neurological: Negative for syncope, weakness and headaches.  All other systems reviewed and are negative.    Physical Exam Updated Vital Signs BP 102/68   Pulse 71   Temp 98.2 F (36.8 C) (Oral)   Resp 18   SpO2  98%   Physical Exam  Constitutional: He is oriented to person, place, and time. He appears well-developed and well-nourished. No distress.  HENT:  Head: Normocephalic and atraumatic.  Eyes: Conjunctivae are normal.  Neck: Neck supple.  Cardiovascular: Normal rate, regular rhythm and normal heart sounds.  Exam reveals no friction rub.   No murmur heard. Pulmonary/Chest: Effort normal and breath sounds normal. No respiratory distress. He has no wheezes. He has no rales.  Abdominal: Soft. Bowel sounds are normal. He exhibits no distension. There is no tenderness. There is no rebound and no guarding.  No CVAT bilaterally  Musculoskeletal: He exhibits no edema.  Mild TTP over left lateral hip. No midline paraspinal or spinal TTP. pROM of hip painless.  Neurological: He is alert and oriented to person, place, and time. He exhibits normal  muscle tone.  Skin: Skin is warm. Capillary refill takes less than 2 seconds.  Psychiatric: He has a normal mood and affect.  Nursing note and vitals reviewed.    ED Treatments / Results  Labs (all labs ordered are listed, but only abnormal results are displayed) Labs Reviewed  CBC WITH DIFFERENTIAL/PLATELET - Abnormal; Notable for the following:       Result Value   RBC 3.67 (*)    Hemoglobin 9.9 (*)    HCT 29.4 (*)    RDW 15.6 (*)    All other components within normal limits  COMPREHENSIVE METABOLIC PANEL - Abnormal; Notable for the following:    Calcium 8.0 (*)    Albumin 3.4 (*)    ALT 10 (*)    Alkaline Phosphatase 243 (*)    All other components within normal limits  URINALYSIS, ROUTINE W REFLEX MICROSCOPIC - Abnormal; Notable for the following:    Leukocytes, UA TRACE (*)    Squamous Epithelial / LPF 0-5 (*)    All other components within normal limits  URINE CULTURE    EKG  EKG Interpretation None       Radiology Dg Lumbar Spine Complete  Result Date: 11/08/2016 CLINICAL DATA:  Metastatic prostate cancer with pain after  injury 1 week ago. EXAM: LUMBAR SPINE - COMPLETE 4+ VIEW COMPARISON:  10/06/2016 chest CT FINDINGS: Mild dextroconvex curvature of the lumbar spine with apex at L2. Diffuse osteoblastic metastatic disease of the axial skeleton from T9 through bony sacrum, both sacral ala, iliac bones and visualized proximal femora. IMPRESSION: Mild dextroconvex curvature the lumbar spine. Diffuse osteoblastic metastatic disease the lumbar spine and visualized pelvis. No acute osseous abnormality. Electronically Signed   By: Steven Church M.D.   On: 11/08/2016 19:44   Dg Hip Unilat W Or Wo Pelvis 2-3 Views Left  Result Date: 11/08/2016 CLINICAL DATA:  Left hip pain x1 week with injury. History of metastatic prostate cancer. Dysuria. EXAM: DG HIP (WITH OR WITHOUT PELVIS) 2-3V LEFT COMPARISON:  None. FINDINGS: There is no evidence of hip fracture or dislocation. Diffuse metastatic osteoblastic disease of the visualized bony pelvis, proximal femora and lower lumbar spine. IMPRESSION: No acute osseous abnormality. Diffuse osteoblastic metastatic disease of the visualized lower lumbar spine, pelvis and proximal femora. Electronically Signed   By: Steven Church M.D.   On: 11/08/2016 19:41    Procedures Procedures (including critical care time)  Medications Ordered in ED Medications  cefTRIAXone (ROCEPHIN) 1 g in dextrose 5 % 50 mL IVPB (0 g Intravenous Stopped 11/08/16 2231)  sodium chloride 0.9 % bolus 1,000 mL (0 mLs Intravenous Stopped 11/08/16 2231)  HYDROcodone-acetaminophen (NORCO/VICODIN) 5-325 MG per tablet 2 tablet (2 tablets Oral Given 11/08/16 2111)     Initial Impression / Assessment and Plan / ED Course  I have reviewed the triage vital signs and the nursing notes.  Pertinent labs & imaging results that were available during my care of the patient were reviewed by me and considered in my medical decision making (see chart for details).    66 yo M with h/o known metastatic prostate CA here with multiple  complaints. Regarding his dysuria, suspect mild UTI. No leukocytosis, fever, or sx to suggest pyelonephritis. DDx includes post-surgical or chemo-induced urethritis. Tolerating PO and renal fxn at baseline. Will give Rocephin, tx with Levaquin for complicated UTI. Pt also c/o left hip pain - he has known bony mets here. Plain films show no acute fragility fx. His distal NV is fully  intact. No spinal compression on plain films, and no evidence of cauda equina. Will treat supportively, d/c home.  Final Clinical Impressions(s) / ED Diagnoses   Final diagnoses:  Lower urinary tract infectious disease  Chronic left hip pain  Anemia, unspecified type    New Prescriptions Discharge Medication List as of 11/08/2016 10:11 PM    START taking these medications   Details  HYDROcodone-acetaminophen (NORCO/VICODIN) 5-325 MG tablet Take 1-2 tablets by mouth every 4 (four) hours as needed for moderate pain or severe pain., Starting Tue 11/08/2016, Print    levofloxacin (LEVAQUIN) 750 MG tablet Take 1 tablet (750 mg total) by mouth daily., Starting Tue 11/08/2016, Until Tue 11/15/2016, Print         Steven Bruce, MD 11/09/16 [phone]

## 2016-11-09 ENCOUNTER — Other Ambulatory Visit: Payer: Self-pay | Admitting: Medical

## 2016-11-10 ENCOUNTER — Telehealth: Payer: Self-pay | Admitting: Oncology

## 2016-11-10 LAB — URINE CULTURE

## 2016-11-10 NOTE — Telephone Encounter (Signed)
OK to reschedule him till next week. He has Lab + Chemo appointment as well that need to be moved.  I can see him on 1/29 8:30 am.

## 2016-11-10 NOTE — Telephone Encounter (Signed)
Patient called and wanted to know if he is just getting over a sinus infection would it still be ok to come for his appointment on Monday

## 2016-11-11 ENCOUNTER — Ambulatory Visit (HOSPITAL_BASED_OUTPATIENT_CLINIC_OR_DEPARTMENT_OTHER): Payer: Medicare Other | Admitting: Oncology

## 2016-11-11 ENCOUNTER — Encounter: Payer: Self-pay | Admitting: Medical Oncology

## 2016-11-11 ENCOUNTER — Other Ambulatory Visit (HOSPITAL_BASED_OUTPATIENT_CLINIC_OR_DEPARTMENT_OTHER): Payer: Medicare Other

## 2016-11-11 ENCOUNTER — Encounter: Payer: Medicare Other | Admitting: Nutrition

## 2016-11-11 ENCOUNTER — Telehealth: Payer: Self-pay | Admitting: Oncology

## 2016-11-11 ENCOUNTER — Encounter: Payer: Self-pay | Admitting: Nutrition

## 2016-11-11 ENCOUNTER — Ambulatory Visit: Payer: Medicare Other

## 2016-11-11 ENCOUNTER — Ambulatory Visit (HOSPITAL_BASED_OUTPATIENT_CLINIC_OR_DEPARTMENT_OTHER): Payer: Medicare Other

## 2016-11-11 VITALS — BP 103/73 | HR 84 | Temp 98.3°F | Resp 16 | Ht 70.0 in | Wt 175.8 lb

## 2016-11-11 VITALS — BP 106/69 | HR 88 | Temp 98.2°F | Resp 17

## 2016-11-11 DIAGNOSIS — Z5111 Encounter for antineoplastic chemotherapy: Secondary | ICD-10-CM

## 2016-11-11 DIAGNOSIS — E291 Testicular hypofunction: Secondary | ICD-10-CM | POA: Diagnosis not present

## 2016-11-11 DIAGNOSIS — C61 Malignant neoplasm of prostate: Secondary | ICD-10-CM

## 2016-11-11 DIAGNOSIS — C7951 Secondary malignant neoplasm of bone: Secondary | ICD-10-CM | POA: Diagnosis not present

## 2016-11-11 DIAGNOSIS — C7931 Secondary malignant neoplasm of brain: Secondary | ICD-10-CM

## 2016-11-11 DIAGNOSIS — J329 Chronic sinusitis, unspecified: Secondary | ICD-10-CM | POA: Diagnosis not present

## 2016-11-11 DIAGNOSIS — M25559 Pain in unspecified hip: Secondary | ICD-10-CM

## 2016-11-11 LAB — COMPREHENSIVE METABOLIC PANEL
ALBUMIN: 3.2 g/dL — AB (ref 3.5–5.0)
ALK PHOS: 261 U/L — AB (ref 40–150)
ALT: 6 U/L (ref 0–55)
AST: 22 U/L (ref 5–34)
Anion Gap: 9 mEq/L (ref 3–11)
BUN: 6.1 mg/dL — ABNORMAL LOW (ref 7.0–26.0)
CO2: 21 mEq/L — ABNORMAL LOW (ref 22–29)
Calcium: 8.3 mg/dL — ABNORMAL LOW (ref 8.4–10.4)
Chloride: 108 mEq/L (ref 98–109)
Creatinine: 0.8 mg/dL (ref 0.7–1.3)
GLUCOSE: 94 mg/dL (ref 70–140)
POTASSIUM: 4.9 meq/L (ref 3.5–5.1)
SODIUM: 139 meq/L (ref 136–145)
Total Bilirubin: 0.54 mg/dL (ref 0.20–1.20)
Total Protein: 6.6 g/dL (ref 6.4–8.3)

## 2016-11-11 LAB — CBC WITH DIFFERENTIAL/PLATELET
BASO%: 0.9 % (ref 0.0–2.0)
BASOS ABS: 0.1 10*3/uL (ref 0.0–0.1)
EOS ABS: 0 10*3/uL (ref 0.0–0.5)
EOS%: 0.8 % (ref 0.0–7.0)
HCT: 30.6 % — ABNORMAL LOW (ref 38.4–49.9)
HEMOGLOBIN: 10.1 g/dL — AB (ref 13.0–17.1)
LYMPH%: 17.6 % (ref 14.0–49.0)
MCH: 27.4 pg (ref 27.2–33.4)
MCHC: 33 g/dL (ref 32.0–36.0)
MCV: 83 fL (ref 79.3–98.0)
MONO#: 0.5 10*3/uL (ref 0.1–0.9)
MONO%: 9 % (ref 0.0–14.0)
NEUT#: 3.9 10*3/uL (ref 1.5–6.5)
NEUT%: 71.7 % (ref 39.0–75.0)
Platelets: 257 10*3/uL (ref 140–400)
RBC: 3.69 10*6/uL — ABNORMAL LOW (ref 4.20–5.82)
RDW: 16 % — AB (ref 11.0–14.6)
WBC: 5.5 10*3/uL (ref 4.0–10.3)
lymph#: 1 10*3/uL (ref 0.9–3.3)

## 2016-11-11 MED ORDER — HEPARIN SOD (PORK) LOCK FLUSH 100 UNIT/ML IV SOLN
500.0000 [IU] | Freq: Once | INTRAVENOUS | Status: AC | PRN
  Administered 2016-11-11: 500 [IU]
  Filled 2016-11-11: qty 5

## 2016-11-11 MED ORDER — DENOSUMAB 120 MG/1.7ML ~~LOC~~ SOLN
120.0000 mg | Freq: Once | SUBCUTANEOUS | Status: AC
  Administered 2016-11-11: 120 mg via SUBCUTANEOUS
  Filled 2016-11-11: qty 1.7

## 2016-11-11 MED ORDER — DEXAMETHASONE SODIUM PHOSPHATE 10 MG/ML IJ SOLN
10.0000 mg | Freq: Once | INTRAMUSCULAR | Status: AC
  Administered 2016-11-11: 10 mg via INTRAVENOUS

## 2016-11-11 MED ORDER — SODIUM CHLORIDE 0.9% FLUSH
10.0000 mL | INTRAVENOUS | Status: DC | PRN
  Administered 2016-11-11: 10 mL
  Filled 2016-11-11: qty 10

## 2016-11-11 MED ORDER — DEXAMETHASONE SODIUM PHOSPHATE 10 MG/ML IJ SOLN
INTRAMUSCULAR | Status: AC
  Filled 2016-11-11: qty 1

## 2016-11-11 MED ORDER — DOCETAXEL CHEMO INJECTION 160 MG/16ML
60.0000 mg/m2 | Freq: Once | INTRAVENOUS | Status: AC
  Administered 2016-11-11: 130 mg via INTRAVENOUS
  Filled 2016-11-11: qty 13

## 2016-11-11 MED ORDER — SODIUM CHLORIDE 0.9 % IV SOLN
Freq: Once | INTRAVENOUS | Status: AC
  Administered 2016-11-11: 10:00:00 via INTRAVENOUS

## 2016-11-11 NOTE — Progress Notes (Signed)
Patient choose to leave before nutrition appointment.

## 2016-11-11 NOTE — Telephone Encounter (Signed)
Lab, Flush, follow up with Dr [name], Chemo and Injection appointments was scheduled per 11/11/16 los. Patient was given a copy f the AVS report and appointment schedule, per 11/11/16 los.

## 2016-11-11 NOTE — Progress Notes (Signed)
Hematology and Oncology Follow Up Visit  Steven Church [id number] 10-26-1950 66 y.o. 11/11/2016 8:48 AM   Principle Diagnosis: 66 year old gentleman with prostate cancer presented with diffuse bony metastasis and lymphadenopathy. His PSA is 899 with unknown Gleason score. His diagnosis was confirmed in June 2016. 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   Prior Therapy:  He is status post a biopsy obtained of the soft tissue in the retroperitoneal area on 04/03/2015. He is status post whole brain radiation for a total of 30 gray in 10 fractions completed in April 2017. Zytiga 1000 mg started in April 2017. Therapy discontinued in December 2017 because of progression of disease.   Current therapy:  Lupron 30 mg every 4 months.  Xgeva to start on 07/24/2015.  Taxotere chemotherapy with cycle one scheduled to start on 10/21/2016. He will receive 60 mg/m every 3 weeks. Here for cycle 2  Interim History:  Steven Church presents today for a follow-up visit with his son. Since the last visit, he received the first cycle of chemotherapy that was well tolerated. He denied any nausea, vomiting or further weight loss. He denied any peripheral neuropathy. He denied any bone pain, back pain or pathological fractures. He is ambulating without major difficulties. He has not reported any falls or syncope.   He was seen in the emergency department on 11/08/2016 for symptoms of sinus infection and possible urinary tract infection. He was treated with levofloxacin which she is currently taking. He denied any fevers, chills or hematuria. He is able to drive himself today and have felt much better.   He does not report any headaches, blurry vision, syncope or seizures. He does not report any fevers, chills, sweats. He does not report any chest pain, palpitation, orthopnea or leg edema. He does not report any cough, shortness of breath, dyspnea  on exertion, wheezing or hemoptysis. He does not report any nausea, vomiting, abdominal pain, hematochezia, or early satiety. He does not report any hematuria or dysuria, hesitancy and nocturia. He does not report any skeletal complaints of arthralgias or myalgias. Remaining review of systems unremarkable.  Medications: I have reviewed the patient's current medications.  Current Outpatient Prescriptions  Medication Sig Dispense Refill  . amLODipine (NORVASC) 10 MG tablet TAKE 1 TABLET BY MOUTH EVERY DAY 90 tablet 0  . calcitRIOL (ROCALTROL) 0.5 MCG capsule TAKE 1 CAPSULE TWICE A DAY 180 capsule 0  . calcium carbonate (TUMS - DOSED IN MG ELEMENTAL CALCIUM) 500 MG chewable tablet Chew 5 tablets (1,000 mg of elemental calcium total) by mouth 3 (three) times daily. 90 tablet 0  . [facility] OYSTER SHELL CALCIUM+VIT D 500-125 MG-UNIT TABS TAKE 1 TABLET BY MOUTH TWICE A DAY 60 tablet 3  . dexamethasone (DECADRON) 2 MG tablet Take 1 tablet (2 mg total) by mouth daily. 30 tablet 0  . finasteride (PROSCAR) 5 MG tablet Take 1 tablet (5 mg total) by mouth daily. 90 tablet 1  . HYDROcodone-acetaminophen (NORCO/VICODIN) 5-325 MG tablet Take 1-2 tablets by mouth every 4 (four) hours as needed for moderate pain or severe pain. 10 tablet 0  . KLOR-CON M20 20 MEQ tablet TAKE 1 TABLET BY MOUTH 2 TIMES DAILY. 60 tablet 0  . levofloxacin (LEVAQUIN) 750 MG tablet Take 1 tablet (750 mg total) by mouth daily. 7 tablet 0  . lidocaine (XYLOCAINE) 5 % ointment Apply 1 application topically as needed. 30 g 1  . omeprazole (PRILOSEC) 40 MG capsule TAKE  1 CAPSULE (40 MG TOTAL) BY MOUTH DAILY. 90 capsule 0  . prochlorperazine (COMPAZINE) 10 MG tablet Take 1 tablet (10 mg total) by mouth every 6 (six) hours as needed for nausea or vomiting. 30 tablet 1  . tamsulosin (FLOMAX) 0.4 MG CAPS capsule TAKE ONE CAPSULE BY MOUTH EVERY DAY 30 capsule 0  . Vitamin D, Ergocalciferol, (DRISDOL) 50000 units CAPS capsule Take 1 capsule (50,000 Units  total) by mouth every other day. 30 capsule 0   No current facility-administered medications for this visit.      Allergies:  Allergies  Allergen Reactions  . Losartan Potassium-Hctz Other (See Comments)    dizziness    Past Medical History, Surgical history, Social history, and Family History were reviewed and updated.   Physical Exam: Blood pressure 103/73, pulse 84, temperature 98.3 F (36.8 C), temperature source Oral, resp. rate 16, height 5\' 10"  (1.778 m), weight 175 lb 12.8 oz (79.7 kg), SpO2 100 %. ECOG: 1 General appearance: Well-appearing gentleman appeared comfortable. Head: Normocephalic, without obvious abnormality no oral thrush noted. Neck: no adenopathy thyroid masses. Lymph nodes: Cervical, supraclavicular, and axillary nodes normal. Heart:regular rate and rhythm, S1, S2 normal, no murmur, click, rub or gallop Lung:chest clear, no wheezing, rales, normal symmetric air entry Abdomin: soft, non-tender, without masses or organomegaly no rebound or guarding. EXT:no erythema, induration, or clubbing.  Neurological examination: No deficits noted.  Lab Results: Lab Results  Component Value Date   WBC 5.5 11/11/2016   HGB 10.1 (L) 11/11/2016   HCT 30.6 (L) 11/11/2016   MCV 83.0 11/11/2016   PLT 257 11/11/2016     Chemistry      Component Value Date/Time   NA 139 11/11/2016 0807   K 4.9 11/11/2016 0807   CL 108 11/08/2016 1850   CO2 21 (L) 11/11/2016 0807   BUN 6.1 (L) 11/11/2016 0807   CREATININE 0.8 11/11/2016 0807      Component Value Date/Time   CALCIUM 8.3 (L) 11/11/2016 0807   ALKPHOS 261 (H) 11/11/2016 0807   AST 22 11/11/2016 0807   ALT 6 11/11/2016 0807   BILITOT 0.54 11/11/2016 0807       Results for Steven Church, Steven SR. (MRN [id number]) as of 10/21/2016 09:17  Ref. Range 07/29/2016 11:22 09/28/2016 11:55  PSA Latest Ref Range: 0.0 - 4.0 ng/mL 39.8 (H) 114.8 (H)       Impression and Plan:  66 year old gentleman with the following  issues:  1. Advanced prostate cancer presenting with diffuse bony metastasis and bulky lymphadenopathy and PSA of 899.8. This is biopsy proven to be prostate cancer.   He started in June 2016 but have developed castration-resistant disease with a PSA rise up to 702.5 As well as brain metastasis.  He is status post of Zytiga therapy which was discontinued in December 2017 because of progression of disease. His PSA was up to 114.8.  He is currently receiving Taxotere chemotherapy after progression of disease on Zytiga. He tolerated last cycle without major complications. He is ready to proceed with cycle 2 today without any dose reduction or delay. He did have a recent sinus infection and thought he might not be able to get chemotherapy but have felt much better today and ready to proceed.  2. Bone directed therapy: He received Xgeva without any major complications. He will receive this with every other chemotherapy cycle.  3. Dural/epidural mass: He is status post whole brain radiation without any residual complications. No new issues noted.  4. Hip  pain: Reasonably controlled with hydrocodone. No exacerbation noted.  5. Prognosis: He does have incurable malignancy that has been palliated effectively at this time. His performance status remains adequate at this time. Aggressive therapy continues to be warranted.  6. Androgen deprivation: He will continue on Lupron 30 mg every 4 months and he will receive his next injection after 11/29/2016. His last Lupron was 30 mg on 07/29/2016.  7. IV access: Port-A-Cath is used without complications.  8. Antiemetics: Prescription for Compazine will be made available to the patient before the start of chemotherapy.  9. Neutropenia prophylaxis: He will receive Neulasta after each chemotherapy cycle.  10. Sinus infection: Appears to be resolving at this time and finishing course of antibiotics.  11. Follow-up: Will be in 3 weeks for the next cycle of  chemotherapy.     Zola Button, MD 1/26/20188:48 AM

## 2016-11-11 NOTE — Progress Notes (Signed)
Mr. Petrosino receiving his 2nd cycle of Taxotere today. He states he did very well with the first cycle. He is recovering from a sinus and urinary tract infection. He states he is glad to be feeling better. I will continue to follow.

## 2016-11-11 NOTE — Patient Instructions (Addendum)
[facility] Discharge Instructions for Patients Receiving Chemotherapy  Today you received the following chemotherapy agents:  Taxotere, Xgeva  To help prevent nausea and vomiting after your treatment, we encourage you to take your nausea medication as prescribed   If you develop nausea and vomiting that is not controlled by your nausea medication, call the clinic.   BELOW ARE SYMPTOMS THAT SHOULD BE REPORTED IMMEDIATELY:  *FEVER GREATER THAN 100.5 F  *CHILLS WITH OR WITHOUT FEVER  NAUSEA AND VOMITING THAT IS NOT CONTROLLED WITH YOUR NAUSEA MEDICATION  *UNUSUAL SHORTNESS OF BREATH  *UNUSUAL BRUISING OR BLEEDING  TENDERNESS IN MOUTH AND THROAT WITH OR WITHOUT PRESENCE OF ULCERS  *URINARY PROBLEMS  *BOWEL PROBLEMS  UNUSUAL RASH Items with * indicate a potential emergency and should be followed up as soon as possible.  Feel free to call the clinic you have any questions or concerns. The clinic phone number is [phone].  Please show the [facility] at check-in to the Emergency Department and triage nurse.    Denosumab injection What is this medicine? DENOSUMAB (den oh sue mab) slows bone breakdown. Prolia is used to treat osteoporosis in women after menopause and in men. Delton See is used to prevent bone fractures and other bone problems caused by cancer bone metastases. Delton See is also used to treat giant cell tumor of the bone. COMMON BRAND NAME(S): Prolia, XGEVA What should I tell my health care provider before I take this medicine? They need to know if you have any of these conditions: -dental disease -eczema -infection or history of infections -kidney disease or on dialysis -low blood calcium or vitamin D -malabsorption syndrome -scheduled to have surgery or tooth extraction -taking medicine that contains denosumab -thyroid or parathyroid disease -an unusual reaction to denosumab, other medicines, foods, dyes, or preservatives -pregnant or  trying to get pregnant -breast-feeding How should I use this medicine? This medicine is for injection under the skin. It is given by a health care professional in a hospital or clinic setting. If you are getting Prolia, a special MedGuide will be given to you by the pharmacist with each prescription and refill. Be sure to read this information carefully each time. For Prolia, talk to your pediatrician regarding the use of this medicine in children. Special care may be needed. For Delton See, talk to your pediatrician regarding the use of this medicine in children. While this drug may be prescribed for children as young as 13 years for selected conditions, precautions do apply. What if I miss a dose? It is important not to miss your dose. Call your doctor or health care professional if you are unable to keep an appointment. What may interact with this medicine? Do not take this medicine with any of the following medications: -other medicines containing denosumab This medicine may also interact with the following medications: -medicines that suppress the immune system -medicines that treat cancer -steroid medicines like prednisone or cortisone What should I watch for while using this medicine? Visit your doctor or health care professional for regular checks on your progress. Your doctor or health care professional may order blood tests and other tests to see how you are doing. Call your doctor or health care professional if you get a cold or other infection while receiving this medicine. Do not treat yourself. This medicine may decrease your body's ability to fight infection. You should make sure you get enough calcium and vitamin D while you are taking this medicine, unless your doctor tells  you not to. Discuss the foods you eat and the vitamins you take with your health care professional. See your dentist regularly. Brush and floss your teeth as directed. Before you have any dental work done, tell your  dentist you are receiving this medicine. Do not become pregnant while taking this medicine or for 5 months after stopping it. Women should inform their doctor if they wish to become pregnant or think they might be pregnant. There is a potential for serious side effects to an unborn child. Talk to your health care professional or pharmacist for more information. What side effects may I notice from receiving this medicine? Side effects that you should report to your doctor or health care professional as soon as possible: -allergic reactions like skin rash, itching or hives, swelling of the face, lips, or tongue -breathing problems -chest pain -fast, irregular heartbeat -feeling faint or lightheaded, falls -fever, chills, or any other sign of infection -muscle spasms, tightening, or twitches -numbness or tingling -skin blisters or bumps, or is dry, peels, or red -slow healing or unexplained pain in the mouth or jaw -unusual bleeding or bruising Side effects that usually do not require medical attention (report to your doctor or health care professional if they continue or are bothersome): -muscle pain -stomach upset, gas Where should I keep my medicine? This medicine is only given in a clinic, doctor's office, or other health care setting and will not be stored at home.  2017 Elsevier/Gold Standard (2015-11-05 10:06:55)

## 2016-11-11 NOTE — Addendum Note (Signed)
Addended by: [name] on: 11/11/2016 09:50 AM   Modules accepted: Orders

## 2016-11-12 ENCOUNTER — Ambulatory Visit (HOSPITAL_BASED_OUTPATIENT_CLINIC_OR_DEPARTMENT_OTHER): Payer: Medicare Other

## 2016-11-12 ENCOUNTER — Ambulatory Visit: Payer: Medicare Other

## 2016-11-12 VITALS — BP 119/79 | HR 86 | Temp 98.1°F | Resp 17

## 2016-11-12 DIAGNOSIS — C7951 Secondary malignant neoplasm of bone: Secondary | ICD-10-CM | POA: Diagnosis not present

## 2016-11-12 DIAGNOSIS — C61 Malignant neoplasm of prostate: Secondary | ICD-10-CM

## 2016-11-12 LAB — PSA: Prostate Specific Ag, Serum: 161.9 ng/mL — ABNORMAL HIGH (ref 0.0–4.0)

## 2016-11-12 MED ORDER — PEGFILGRASTIM INJECTION 6 MG/0.6ML ~~LOC~~
6.0000 mg | PREFILLED_SYRINGE | Freq: Once | SUBCUTANEOUS | Status: AC
  Administered 2016-11-12: 6 mg via SUBCUTANEOUS

## 2016-11-20 ENCOUNTER — Other Ambulatory Visit: Payer: Self-pay | Admitting: Medical

## 2016-11-20 ENCOUNTER — Other Ambulatory Visit: Payer: Self-pay | Admitting: Oncology

## 2016-11-20 DIAGNOSIS — C61 Malignant neoplasm of prostate: Secondary | ICD-10-CM

## 2016-11-30 ENCOUNTER — Encounter: Payer: Self-pay | Admitting: Family Medicine

## 2016-11-30 ENCOUNTER — Ambulatory Visit (INDEPENDENT_AMBULATORY_CARE_PROVIDER_SITE_OTHER): Payer: Medicare Other | Admitting: Family Medicine

## 2016-11-30 ENCOUNTER — Other Ambulatory Visit: Payer: Self-pay | Admitting: Medical

## 2016-11-30 VITALS — BP 126/80 | HR 92 | Temp 97.3°F | Ht 70.0 in | Wt 162.4 lb

## 2016-11-30 DIAGNOSIS — C61 Malignant neoplasm of prostate: Secondary | ICD-10-CM | POA: Diagnosis not present

## 2016-11-30 DIAGNOSIS — C7931 Secondary malignant neoplasm of brain: Secondary | ICD-10-CM

## 2016-11-30 DIAGNOSIS — C7951 Secondary malignant neoplasm of bone: Secondary | ICD-10-CM

## 2016-11-30 DIAGNOSIS — J019 Acute sinusitis, unspecified: Secondary | ICD-10-CM

## 2016-11-30 MED ORDER — AMOXICILLIN 875 MG PO TABS: 875.0000 mg | ORAL_TABLET | Freq: Two times a day (BID) | ORAL | 0 refills | Status: DC

## 2016-11-30 NOTE — Progress Notes (Signed)
Chief Complaint  Patient presents with  . Nasal Congestion    stuffed up nose and his mucus is yellow and some blood in the mucus as well. No fevers. Has had symptoms 2-3 weeks. Worse at night. No coughing, chills or body aches.    "I can't breath out of my nose".  Sinuses have been congested for about 4 weeks, which keeps him up at night because he is so stopped up.  Nasal mucus is yellowish, sometimes blood is mixed in.  Denies postnasal drainage, sore throat or cough.  He denies headaches or pain in his sinuses.  Lips feels slightly numb, lower lips.  No numbness, tingling, weakness elsewhere in the body.  Hasn't taken any OTC meds.  Denies sore throat, cough, shortness of breath, headache, dizziness  PMH, PSH, SH reviewed  Unclear on his medications--hard to verify with him. Outpatient Encounter Prescriptions as of 11/30/2016  Medication Sig Note  . amLODipine (NORVASC) 10 MG tablet TAKE 1 TABLET BY MOUTH EVERY DAY   . calcitRIOL (ROCALTROL) 0.5 MCG capsule TAKE 1 CAPSULE TWICE A DAY   . calcium carbonate (TUMS - DOSED IN MG ELEMENTAL CALCIUM) 500 MG chewable tablet Chew 5 tablets (1,000 mg of elemental calcium total) by mouth 3 (three) times daily.   . [facility] OYSTER SHELL CALCIUM+VIT D 500-125 MG-UNIT TABS TAKE 1 TABLET BY MOUTH TWICE A DAY   . dexamethasone (DECADRON) 2 MG tablet Take 1 tablet (2 mg total) by mouth daily. 11/30/2016: He thinks this was stopped, and is no longer taking  . finasteride (PROSCAR) 5 MG tablet Take 1 tablet (5 mg total) by mouth daily.   Marland Kitchen HYDROcodone-acetaminophen (NORCO/VICODIN) 5-325 MG tablet Take 1-2 tablets by mouth every 4 (four) hours as needed for moderate pain or severe pain.   Marland Kitchen KLOR-CON M20 20 MEQ tablet TAKE 1 TABLET BY MOUTH 2 TIMES DAILY.   Marland Kitchen omeprazole (PRILOSEC) 40 MG capsule TAKE 1 CAPSULE (40 MG TOTAL) BY MOUTH DAILY.   Marland Kitchen prochlorperazine (COMPAZINE) 10 MG tablet Take 1 tablet (10 mg total) by mouth every 6 (six) hours as needed for nausea or  vomiting.   . tamsulosin (FLOMAX) 0.4 MG CAPS capsule TAKE ONE CAPSULE BY MOUTH EVERY DAY   . lidocaine (XYLOCAINE) 5 % ointment Apply 1 application topically as needed. (Patient not taking: Reported on 11/30/2016)   . Vitamin D, Ergocalciferol, (DRISDOL) 50000 units CAPS capsule Take 1 capsule (50,000 Units total) by mouth every other day. (Patient not taking: Reported on 11/30/2016)    No facility-administered encounter medications on file as of 11/30/2016.    Allergies  Allergen Reactions  . Losartan Potassium-Hctz Other (See Comments)    dizziness    ROS: denies fever, chills, nausea, vomiting, diarrhea, bleeding, bruising, rash.  Denies abdominal pain. Denies headaches, dizziness, body aches. Denies cough, shortness of breath.  PHYSICAL EXAM:  BP 126/80 (BP Location: Left Arm, Patient Position: Sitting, Cuff Size: Normal)   Pulse 92   Temp 97.3 F (36.3 C) (Tympanic)   Ht 5\' 10"  (1.778 m)   Wt 162 lb 6.4 oz (73.7 kg)   BMI 23.30 kg/m    Elderly male, appears older than stated age HEENT: he sounds congested, blowing nose periodically during visit--yellow, some darker yellow-green also blown out.  Conjunctiva and sclera are clear. TM's and EAc's normal. Nasal mucosa is mild-mod edematous, no erythema or purulence noted.  OP is clear, edentulous.  No erythema or lesions. Sinuses are nontender Neck: no lymphadenopathy Heart: regular rate  and rhythm Lungs: clear bilaterally Skin normal turgor, no rash Psych: normal mood, affect Neuro: alert. 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 Use saline spray frequently throughout the day to help with any crusting/dryness and to keep the nose clear. Start taking antibiotics to treat a sinus infection--take this  twice daily for 10 days. You may use Afrin nasal decongestant spray at bedtime for the next few days to help open your nose so you can sleep. You do not want to use this long-term, or it can make things worse.  Use it just at night for the next few days while waiting for the antibiotics to work. You can use the saline nasal spray as often as needed. If the mucus gets very thick, you may want to consider taking guaifenesin (found in robitussin or mucinex).  Return if you develop fever, worsening symptoms, or other new problems arise.

## 2016-11-30 NOTE — Patient Instructions (Signed)
  Drink plenty of water--this helps keep the mucus clear. Use saline spray frequently throughout the day to help with any crusting/dryness and to keep the nose clear. Start taking antibiotics to treat a sinus infection--take this twice daily for 10 days. You may use Afrin nasal decongestant spray at bedtime for the next few days to help open your nose so you can sleep. You do not want to use this long-term, or it can make things worse.  Use it just at night for the next few days while waiting for the antibiotics to work. You can use the saline nasal spray as often as needed. If the mucus gets very thick, you may want to consider taking guaifenesin (found in robitussin or mucinex).  Return if you develop fever, worsening symptoms, or other new problems arise.

## 2016-12-02 ENCOUNTER — Encounter: Payer: Self-pay | Admitting: Medical Oncology

## 2016-12-02 ENCOUNTER — Other Ambulatory Visit (HOSPITAL_BASED_OUTPATIENT_CLINIC_OR_DEPARTMENT_OTHER): Payer: Medicare Other

## 2016-12-02 ENCOUNTER — Encounter: Payer: Medicare Other | Admitting: Nutrition

## 2016-12-02 ENCOUNTER — Encounter: Payer: Self-pay | Admitting: Oncology

## 2016-12-02 ENCOUNTER — Ambulatory Visit (HOSPITAL_BASED_OUTPATIENT_CLINIC_OR_DEPARTMENT_OTHER): Payer: Medicare Other | Admitting: Oncology

## 2016-12-02 ENCOUNTER — Telehealth: Payer: Self-pay | Admitting: Oncology

## 2016-12-02 ENCOUNTER — Ambulatory Visit: Payer: Medicare Other | Admitting: Nutrition

## 2016-12-02 ENCOUNTER — Other Ambulatory Visit: Payer: Self-pay | Admitting: *Deleted

## 2016-12-02 ENCOUNTER — Ambulatory Visit (HOSPITAL_BASED_OUTPATIENT_CLINIC_OR_DEPARTMENT_OTHER): Payer: Medicare Other

## 2016-12-02 ENCOUNTER — Ambulatory Visit: Payer: Medicare Other

## 2016-12-02 VITALS — BP 103/72 | HR 91 | Temp 98.4°F | Resp 18 | Wt 157.3 lb

## 2016-12-02 DIAGNOSIS — E291 Testicular hypofunction: Secondary | ICD-10-CM

## 2016-12-02 DIAGNOSIS — C7951 Secondary malignant neoplasm of bone: Secondary | ICD-10-CM

## 2016-12-02 DIAGNOSIS — M25559 Pain in unspecified hip: Secondary | ICD-10-CM | POA: Diagnosis not present

## 2016-12-02 DIAGNOSIS — C7931 Secondary malignant neoplasm of brain: Secondary | ICD-10-CM

## 2016-12-02 DIAGNOSIS — Z5111 Encounter for antineoplastic chemotherapy: Secondary | ICD-10-CM | POA: Diagnosis present

## 2016-12-02 DIAGNOSIS — C61 Malignant neoplasm of prostate: Secondary | ICD-10-CM

## 2016-12-02 DIAGNOSIS — R630 Anorexia: Secondary | ICD-10-CM | POA: Diagnosis not present

## 2016-12-02 DIAGNOSIS — Z95828 Presence of other vascular implants and grafts: Secondary | ICD-10-CM

## 2016-12-02 LAB — COMPREHENSIVE METABOLIC PANEL
ALBUMIN: 3.2 g/dL — AB (ref 3.5–5.0)
ALK PHOS: 263 U/L — AB (ref 40–150)
AST: 35 U/L — AB (ref 5–34)
Anion Gap: 8 mEq/L (ref 3–11)
BUN: 14.2 mg/dL (ref 7.0–26.0)
CALCIUM: 8.6 mg/dL (ref 8.4–10.4)
CHLORIDE: 108 meq/L (ref 98–109)
CO2: 18 mEq/L — ABNORMAL LOW (ref 22–29)
CREATININE: 1 mg/dL (ref 0.7–1.3)
EGFR: >90 mL/min/{1.73_m2} (ref >90)
GLUCOSE: 107 mg/dL (ref 70–140)
Potassium: 5.6 mEq/L — ABNORMAL HIGH (ref 3.5–5.1)
Sodium: 134 mEq/L — ABNORMAL LOW (ref 136–145)
Total Bilirubin: 0.46 mg/dL (ref 0.20–1.20)
Total Protein: 7 g/dL (ref 6.4–8.3)

## 2016-12-02 LAB — CBC WITH DIFFERENTIAL/PLATELET
BASO%: 1.1 % (ref 0.0–2.0)
Basophils Absolute: 0.1 10*3/uL (ref 0.0–0.1)
EOS%: 0.6 % (ref 0.0–7.0)
Eosinophils Absolute: 0 10*3/uL (ref 0.0–0.5)
HEMATOCRIT: 28.7 % — AB (ref 38.4–49.9)
HEMOGLOBIN: 9.8 g/dL — AB (ref 13.0–17.1)
LYMPH#: 1.2 10*3/uL (ref 0.9–3.3)
LYMPH%: 17.7 % (ref 14.0–49.0)
MCH: 27.9 pg (ref 27.2–33.4)
MCHC: 34 g/dL (ref 32.0–36.0)
MCV: 82 fL (ref 79.3–98.0)
MONO#: 0.6 10*3/uL (ref 0.1–0.9)
MONO%: 9.8 % (ref 0.0–14.0)
NEUT#: 4.6 10*3/uL (ref 1.5–6.5)
NEUT%: 70.8 % (ref 39.0–75.0)
Platelets: 298 10*3/uL (ref 140–400)
RBC: 3.5 10*6/uL — ABNORMAL LOW (ref 4.20–5.82)
RDW: 17.3 % — AB (ref 11.0–14.6)
WBC: 6.5 10*3/uL (ref 4.0–10.3)

## 2016-12-02 MED ORDER — SODIUM CHLORIDE 0.9% FLUSH
10.0000 mL | INTRAVENOUS | Status: DC | PRN
  Administered 2016-12-02: 10 mL
  Filled 2016-12-02: qty 10

## 2016-12-02 MED ORDER — MEGESTROL ACETATE 400 MG/10ML PO SUSP: 400.0000 mg | Freq: Two times a day (BID) | ORAL | 0 refills | Status: DC

## 2016-12-02 MED ORDER — HEPARIN SOD (PORK) LOCK FLUSH 100 UNIT/ML IV SOLN
500.0000 [IU] | Freq: Once | INTRAVENOUS | Status: AC | PRN
  Administered 2016-12-02: 500 [IU]
  Filled 2016-12-02: qty 5

## 2016-12-02 MED ORDER — SODIUM CHLORIDE 0.9 % IV SOLN
Freq: Once | INTRAVENOUS | Status: AC
  Administered 2016-12-02: 09:00:00 via INTRAVENOUS

## 2016-12-02 MED ORDER — DEXAMETHASONE SODIUM PHOSPHATE 10 MG/ML IJ SOLN
INTRAMUSCULAR | Status: AC
  Filled 2016-12-02: qty 1

## 2016-12-02 MED ORDER — TEMAZEPAM 15 MG PO CAPS: 15.0000 mg | ORAL_CAPSULE | Freq: Every evening | ORAL | 0 refills | Status: DC | PRN

## 2016-12-02 MED ORDER — DEXAMETHASONE SODIUM PHOSPHATE 10 MG/ML IJ SOLN
10.0000 mg | Freq: Once | INTRAMUSCULAR | Status: AC
  Administered 2016-12-02: 10 mg via INTRAVENOUS

## 2016-12-02 MED ORDER — SODIUM CHLORIDE 0.9% FLUSH
10.0000 mL | INTRAVENOUS | Status: DC | PRN
  Administered 2016-12-02: 10 mL via INTRAVENOUS
  Filled 2016-12-02: qty 10

## 2016-12-02 MED ORDER — LEUPROLIDE ACETATE (4 MONTH) 30 MG IM KIT
30.0000 mg | PACK | Freq: Once | INTRAMUSCULAR | Status: AC
  Administered 2016-12-02: 30 mg via INTRAMUSCULAR
  Filled 2016-12-02: qty 30

## 2016-12-02 MED ORDER — DOCETAXEL CHEMO INJECTION 160 MG/16ML
60.0000 mg/m2 | Freq: Once | INTRAVENOUS | Status: AC
  Administered 2016-12-02: 130 mg via INTRAVENOUS
  Filled 2016-12-02: qty 13

## 2016-12-02 MED FILL — TEMAZEPAM 15 MG CAPSULE: 15 | 30 days supply | Qty: 30 | Fill #0

## 2016-12-02 MED FILL — MEGESTROL ACET 40 MG/ML SUS: 40 | 12 days supply | Qty: 240 | Fill #0

## 2016-12-02 NOTE — Progress Notes (Signed)
Mr. Banken states he has a poor appetite. He makes himself eat but really does not care if he eats or not. We discussed protein drinks and high caloric foods. He will meet with [facility] today while in chemo. He also has had trouble sleeping at night and requested something for sleep. Dr. [name] wrote him  prescriptions  for Megace and for Restoril. He asked if I could help him get these filled. I spoke with Chrisitine and these will be paid for by [employer] funds. I instructed him to go to the [employer] to pick these up. I gave him another business card and asked him to call me with any questions or concerns.

## 2016-12-02 NOTE — Progress Notes (Signed)
Spoke with Altha Harm about prostate funds and how it works for now. Patient now has 2 grants([facility]-$400 and Prostate-$200(Transportation and medication only).

## 2016-12-02 NOTE — Telephone Encounter (Signed)
Appointments scheduled per 2/16 LOS. Patient given AVS report and calendars with future scheduled appointments. °

## 2016-12-02 NOTE — Progress Notes (Signed)
Nutrition follow up completed with patient during treatment for metastatic prostate cancer. Patient reports poor appetite and states he has a hard time remembering to eat. Weight continues to decrease and was documented as 157.3 pounds on Feb 16 from 177.4 pounds on January 5. Patient reports intolerance to ensure or equivalent. Sister is supportive and helps patient remember to eat.  Nutrition Diagnosis: Inadequate oral intake continues.  Intervention: Reviewed importance of patient eating 6 times daily. Reviewed foods he should have available to choose from daily. Provided samples of clear liquid oral nutrition supplements. Teach back method used.  Monitoring, Evaluation, Goals: Patient will increase oral intake to minimize further weight loss.  Next Visit: To be scheduled as needed.  ..**Disclaimer: This note was dictated with voice recognition software. Similar sounding words can inadvertently be transcribed and this note may contain transcription errors which may not have been corrected upon publication of note.**

## 2016-12-02 NOTE — Patient Instructions (Signed)
[facility] Discharge Instructions for Patients Receiving Chemotherapy  Today you received the following chemotherapy agent(s) docetaxel (Taxotere).  To help prevent nausea and vomiting after your treatment, we encourage you to take your nausea medication as directed by your MD.   If you develop nausea and vomiting that is not controlled by your nausea medication, call the clinic.   BELOW ARE SYMPTOMS THAT SHOULD BE REPORTED IMMEDIATELY:  *FEVER GREATER THAN 100.5 F  *CHILLS WITH OR WITHOUT FEVER  NAUSEA AND VOMITING THAT IS NOT CONTROLLED WITH YOUR NAUSEA MEDICATION  *UNUSUAL SHORTNESS OF BREATH  *UNUSUAL BRUISING OR BLEEDING  TENDERNESS IN MOUTH AND THROAT WITH OR WITHOUT PRESENCE OF ULCERS  *URINARY PROBLEMS  *BOWEL PROBLEMS  UNUSUAL RASH Items with * indicate a potential emergency and should be followed up as soon as possible.  Feel free to call the clinic you have any questions or concerns. The clinic phone number is [phone].  Please show the [facility] at check-in to the Emergency Department and triage nurse.   Dexamethasone injection What is this medicine? DEXAMETHASONE (dex a METH a sone) is a corticosteroid. It is used to treat inflammation of the skin, joints, lungs, and other organs. Common conditions treated include asthma, allergies, and arthritis. It is also used for other conditions, like blood disorders and diseases of the adrenal glands. This medicine may be used for other purposes; ask your health care provider or pharmacist if you have questions. COMMON BRAND NAME(S): Decadron, DoubleDex, Simplist Dexamethasone, Solurex What should I tell my health care provider before I take this medicine? They need to know if you have any of these conditions: -blood clotting problems -Cushing's syndrome -diabetes -glaucoma -heart problems or disease -high blood pressure -infection like herpes, measles, tuberculosis, or chickenpox -kidney  disease -liver disease -mental problems -myasthenia gravis -osteoporosis -previous heart attack -seizures -stomach, ulcer or intestine disease including colitis and diverticulitis -thyroid problem -an unusual or allergic reaction to dexamethasone, corticosteroids, other medicines, lactose, foods, dyes, or preservatives -pregnant or trying to get pregnant -breast-feeding How should I use this medicine? This medicine is for injection into a muscle, joint, lesion, soft tissue, or vein. It is given by a health care professional in a hospital or clinic setting. Talk to your pediatrician regarding the use of this medicine in children. Special care may be needed. Overdosage: If you think you have taken too much of this medicine contact a poison control center or emergency room at once. NOTE: This medicine is only for you. Do not share this medicine with others. What if I miss a dose? This may not apply. If you are having a series of injections over a prolonged period, try not to miss an appointment. Call your doctor or health care professional to reschedule if you are unable to keep an appointment. What may interact with this medicine? Do not take this medicine with any of the following medications: -mifepristone, RU-486 -vaccines This medicine may also interact with the following medications: -amphotericin B -antibiotics like clarithromycin, erythromycin, and troleandomycin -aspirin and aspirin-like drugs -barbiturates like phenobarbital -carbamazepine -cholestyramine -cholinesterase inhibitors like donepezil, galantamine, rivastigmine, and tacrine -cyclosporine -digoxin -diuretics -ephedrine -male hormones, like estrogens or progestins and birth control pills -indinavir -isoniazid -ketoconazole -medicines for diabetes -medicines that improve muscle tone or strength for conditions like myasthenia gravis -NSAIDs, medicines for pain and inflammation, like ibuprofen or  naproxen -phenytoin -rifampin -thalidomide -warfarin This list may not describe all possible interactions. Give your health care provider a  list of all the medicines, herbs, non-prescription drugs, or dietary supplements you use. Also tell them if you smoke, drink alcohol, or use illegal drugs. Some items may interact with your medicine. What should I watch for while using this medicine? Your condition will be monitored carefully while you are receiving this medicine. If you are taking this medicine for a long time, carry an identification card with your name and address, the type and dose of your medicine, and your doctor's name and address. This medicine may increase your risk of getting an infection. Stay away from people who are sick. Tell your doctor or health care professional if you are around anyone with measles or chickenpox. Talk to your health care provider before you get any vaccines that you take this medicine. If you are going to have surgery, tell your doctor or health care professional that you have taken this medicine within the last twelve months. Ask your doctor or health care professional about your diet. You may need to lower the amount of salt you eat. The medicine can increase your blood sugar. If you are a diabetic check with your doctor if you need help adjusting the dose of your diabetic medicine. What side effects may I notice from receiving this medicine? Side effects that you should report to your doctor or health care professional as soon as possible: -allergic reactions like skin rash, itching or hives, swelling of the face, lips, or tongue -black or tarry stools -change in the amount of urine -changes in vision -confusion, excitement, restlessness, a false sense of well-being -fever, sore throat, sneezing, cough, or other signs of infection, wounds that will not heal -hallucinations -increased thirst -mental depression, mood swings, mistaken feelings of self  importance or of being mistreated -pain in hips, back, ribs, arms, shoulders, or legs -pain, redness, or irritation at the injection site -redness, blistering, peeling or loosening of the skin, including inside the mouth -rounding out of face -swelling of feet or lower legs -unusual bleeding or bruising -unusual tired or weak -wounds that do not heal Side effects that usually do not require medical attention (report to your doctor or health care professional if they continue or are bothersome): -diarrhea or constipation -change in taste -headache -nausea, vomiting -skin problems, acne, thin and shiny skin -touble sleeping -unusual growth of hair on the face or body -weight gain This list may not describe all possible side effects. Call your doctor for medical advice about side effects. You may report side effects to FDA at 1-800-FDA-1088. Where should I keep my medicine? This drug is given in a hospital or clinic and will not be stored at home. NOTE: This sheet is a summary. It may not cover all possible information. If you have questions about this medicine, talk to your doctor, pharmacist, or health care provider.  2017 Elsevier/Gold Standard (2008-01-24 14:04:12)   Docetaxel injection What is this medicine? DOCETAXEL (doe se TAX el) is a chemotherapy drug. It targets fast dividing cells, like cancer cells, and causes these cells to die. This medicine is used to treat many types of cancers like breast cancer, certain stomach cancers, head and neck cancer, lung cancer, and prostate cancer. This medicine may be used for other purposes; ask your health care provider or pharmacist if you have questions. COMMON BRAND NAME(S): Docefrez, Taxotere What should I tell my health care provider before I take this medicine? They need to know if you have any of these conditions: -infection (especially a virus infection such  as chickenpox, cold sores, or herpes) -liver disease -low blood counts,  like low white cell, platelet, or red cell counts -an unusual or allergic reaction to docetaxel, polysorbate 80, other chemotherapy agents, other medicines, foods, dyes, or preservatives -pregnant or trying to get pregnant -breast-feeding How should I use this medicine? This drug is given as an infusion into a vein. It is administered in a hospital or clinic by a specially trained health care professional. Talk to your pediatrician regarding the use of this medicine in children. Special care may be needed. Overdosage: If you think you have taken too much of this medicine contact a poison control center or emergency room at once. NOTE: This medicine is only for you. Do not share this medicine with others. What if I miss a dose? It is important not to miss your dose. Call your doctor or health care professional if you are unable to keep an appointment. What may interact with this medicine? -cyclosporine -erythromycin -ketoconazole -medicines to increase blood counts like filgrastim, pegfilgrastim, sargramostim -vaccines Talk to your doctor or health care professional before taking any of these medicines: -acetaminophen -aspirin -ibuprofen -ketoprofen -naproxen This list may not describe all possible interactions. Give your health care provider a list of all the medicines, herbs, non-prescription drugs, or dietary supplements you use. Also tell them if you smoke, drink alcohol, or use illegal drugs. Some items may interact with your medicine. What should I watch for while using this medicine? Your condition will be monitored carefully while you are receiving this medicine. You will need important blood work done while you are taking this medicine. This drug may make you feel generally unwell. This is not uncommon, as chemotherapy can affect healthy cells as well as cancer cells. Report any side effects. Continue your course of treatment even though you feel ill unless your doctor tells you to  stop. In some cases, you may be given additional medicines to help with side effects. Follow all directions for their use. Call your doctor or health care professional for advice if you get a fever, chills or sore throat, or other symptoms of a cold or flu. Do not treat yourself. This drug decreases your body's ability to fight infections. Try to avoid being around people who are sick. This medicine may increase your risk to bruise or bleed. Call your doctor or health care professional if you notice any unusual bleeding. This medicine may contain alcohol in the product. You may get drowsy or dizzy. Do not drive, use machinery, or do anything that needs mental alertness until you know how this medicine affects you. Do not stand or sit up quickly, especially if you are an older patient. This reduces the risk of dizzy or fainting spells. Avoid alcoholic drinks. Do not become pregnant while taking this medicine. Women should inform their doctor if they wish to become pregnant or think they might be pregnant. There is a potential for serious side effects to an unborn child. Talk to your health care professional or pharmacist for more information. Do not breast-feed an infant while taking this medicine. What side effects may I notice from receiving this medicine? Side effects that you should report to your doctor or health care professional as soon as possible: -allergic reactions like skin rash, itching or hives, swelling of the face, lips, or tongue -low blood counts - This drug may decrease the number of white blood cells, red blood cells and platelets. You may be at increased risk for infections and  bleeding. -signs of infection - fever or chills, cough, sore throat, pain or difficulty passing urine -signs of decreased platelets or bleeding - bruising, pinpoint red spots on the skin, black, tarry stools, nosebleeds -signs of decreased red blood cells - unusually weak or tired, fainting spells,  lightheadedness -breathing problems -fast or irregular heartbeat -low blood pressure -mouth sores -nausea and vomiting -pain, swelling, redness or irritation at the injection site -pain, tingling, numbness in the hands or feet -swelling of the ankle, feet, hands -weight gain Side effects that usually do not require medical attention (report to your doctor or health care professional if they continue or are bothersome): -bone pain -complete hair loss including hair on your head, underarms, pubic hair, eyebrows, and eyelashes -diarrhea -excessive tearing -changes in the color of fingernails -loosening of the fingernails -nausea -muscle pain -red flush to skin -sweating -weak or tired This list may not describe all possible side effects. Call your doctor for medical advice about side effects. You may report side effects to FDA at 1-800-FDA-1088. Where should I keep my medicine? This drug is given in a hospital or clinic and will not be stored at home. NOTE: This sheet is a summary. It may not cover all possible information. If you have questions about this medicine, talk to your doctor, pharmacist, or health care provider.  2017 Elsevier/Gold Standard (2015-11-05 12:32:56)

## 2016-12-02 NOTE — Progress Notes (Signed)
Hematology and Oncology Follow Up Visit  Steven Church [id number] [birth year] 66 y.o. 12/02/2016 8:35 AM   Principle Diagnosis: 66 year old gentleman with prostate cancer presented with diffuse bony metastasis and lymphadenopathy. His PSA is 899 with unknown Gleason score. His diagnosis was confirmed in June 2016. 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   Prior Therapy:  He is status post a biopsy obtained of the soft tissue in the retroperitoneal area on 04/03/2015. He is status post whole brain radiation for a total of 30 gray in 10 fractions completed in April 2017. Zytiga 1000 mg started in April 2017. Therapy discontinued in December 2017 because of progression of disease.   Current therapy:  Lupron 30 mg every 4 months.  Xgeva to start on 07/24/2015.  Taxotere chemotherapy with cycle one scheduled to start on 10/21/2016. He will receive 60 mg/m every 3 weeks. Here for cycle 3.   Interim History:  Steven Church presents today for a follow-up visit with his son. Since the last visit, he reports no recent complaints. He continues to tolerate chemotherapy without major complications. He denied any nausea, vomiting or further weight loss. He denied any peripheral neuropathy. He denied any bone pain, back pain or pathological fractures. He is ambulating without major difficulties. He has not reported any falls or syncope.   He reports poor by mouth intake and have lost more weight since last visit. He reports he is able to swallow but his appetite have declined. He reports she's been using nutritional supplements at time that have had diarrhea associated with some of it. He denied any dysphasia or the dysphagia. He denied any nausea or abdominal pain.   He does not report any headaches, blurry vision, syncope or seizures. He does not report any fevers, chills, sweats. He does not report any chest pain, palpitation,  orthopnea or leg edema. He does not report any cough, shortness of breath, dyspnea on exertion, wheezing or hemoptysis. He does not report any nausea, vomiting, abdominal pain, hematochezia, or early satiety. He does not report any hematuria or dysuria, hesitancy and nocturia. He does not report any skeletal complaints of arthralgias or myalgias. Remaining review of systems unremarkable.  Medications: I have reviewed the patient's current medications.  Current Outpatient Prescriptions  Medication Sig Dispense Refill  . amLODipine (NORVASC) 10 MG tablet TAKE 1 TABLET BY MOUTH EVERY DAY 90 tablet 0  . amoxicillin (AMOXIL) 875 MG tablet Take 1 tablet (875 mg total) by mouth 2 (two) times daily. 20 tablet 0  . calcitRIOL (ROCALTROL) 0.5 MCG capsule TAKE 1 CAPSULE TWICE A DAY 180 capsule 0  . calcium carbonate (TUMS - DOSED IN MG ELEMENTAL CALCIUM) 500 MG chewable tablet Chew 5 tablets (1,000 mg of elemental calcium total) by mouth 3 (three) times daily. 90 tablet 0  . [facility] OYSTER SHELL CALCIUM+VIT D 500-125 MG-UNIT TABS TAKE 1 TABLET BY MOUTH TWICE A DAY 60 tablet 3  . dexamethasone (DECADRON) 2 MG tablet Take 1 tablet (2 mg total) by mouth daily. 30 tablet 0  . finasteride (PROSCAR) 5 MG tablet Take 1 tablet (5 mg total) by mouth daily. 90 tablet 1  . HYDROcodone-acetaminophen (NORCO/VICODIN) 5-325 MG tablet Take 1-2 tablets by mouth every 4 (four) hours as needed for moderate pain or severe pain. 10 tablet 0  . KLOR-CON M20 20 MEQ tablet TAKE 1 TABLET BY MOUTH 2 TIMES DAILY. 60 tablet 0  . lidocaine (XYLOCAINE) 5 % ointment Apply  1 application topically as needed. 30 g 1  . megestrol (MEGACE) 400 MG/10ML suspension Take 10 mLs (400 mg total) by mouth 2 (two) times daily. 240 mL 0  . omeprazole (PRILOSEC) 40 MG capsule TAKE 1 CAPSULE (40 MG TOTAL) BY MOUTH DAILY. 90 capsule 0  . prochlorperazine (COMPAZINE) 10 MG tablet Take 1 tablet (10 mg total) by mouth every 6 (six) hours as needed for nausea or  vomiting. 30 tablet 1  . tamsulosin (FLOMAX) 0.4 MG CAPS capsule TAKE ONE CAPSULE BY MOUTH EVERY DAY 30 capsule 0  . Vitamin D, Ergocalciferol, (DRISDOL) 50000 units CAPS capsule Take 1 capsule (50,000 Units total) by mouth every other day. 30 capsule 0   No current facility-administered medications for this visit.      Allergies:  Allergies  Allergen Reactions  . Losartan Potassium-Hctz Other (See Comments)    dizziness    Past Medical History, Surgical history, Social history, and Family History were reviewed and updated.   Physical Exam: Blood pressure 103/72, pulse 91, temperature 98.4 F (36.9 C), temperature source Oral, resp. rate 18, weight 157 lb 4.8 oz (71.4 kg), SpO2 100 %. ECOG: 1 General appearance: Alert, awake gentleman without distress. Head: Normocephalic, without obvious abnormality no oral thrush noted. Neck: no adenopathy thyroid masses. Lymph nodes: Cervical, supraclavicular, and axillary nodes normal. Heart:regular rate and rhythm, S1, S2 normal, no murmur, click, rub or gallop Lung:chest clear, no wheezing, rales, normal symmetric air entry Abdomin: soft, non-tender, without masses or organomegaly no shifting dullness or ascites. EXT:no erythema, induration, or clubbing.  Neurological examination: No deficits noted.  Lab Results: Lab Results  Component Value Date   WBC 6.5 12/02/2016   HGB 9.8 (L) 12/02/2016   HCT 28.7 (L) 12/02/2016   MCV 82.0 12/02/2016   PLT 298 12/02/2016     Chemistry      Component Value Date/Time   NA 139 11/11/2016 0807   K 4.9 11/11/2016 0807   CL 108 11/08/2016 1850   CO2 21 (L) 11/11/2016 0807   BUN 6.1 (L) 11/11/2016 0807   CREATININE 0.8 11/11/2016 0807      Component Value Date/Time   CALCIUM 8.3 (L) 11/11/2016 0807   ALKPHOS 261 (H) 11/11/2016 0807   AST 22 11/11/2016 0807   ALT 6 11/11/2016 0807   BILITOT 0.54 11/11/2016 0807       Results for Steven Church, Steven Church. (MRN [id number]) as of 12/02/2016  08:19  Ref. Range 09/28/2016 11:55 10/21/2016 08:50 11/11/2016 08:06  PSA Latest Ref Range: 0.0 - 4.0 ng/mL 114.8 (H) 201.5 (H) 161.9 (H)        Impression and Plan:  66 year old gentleman with the following issues:  1. Advanced prostate cancer presenting with diffuse bony metastasis and bulky lymphadenopathy and PSA of 899.8. This is biopsy proven to be prostate cancer.   He started in June 2016 but have developed castration-resistant disease with a PSA rise up to 702.5 As well as brain metastasis.  He is status post of Zytiga therapy which was discontinued in December 2017 because of progression of disease. His PSA was up to 114.8.  He is currently receiving Taxotere chemotherapy after progression of disease on Zytiga.   He tolerated therapy reasonably well without major complications. His PSA did decrease since the last visit and the plan is to continue the same dose and schedule.  2. Bone directed therapy: He received Xgeva without any major complications. He will receive this with every other chemotherapy cycle.  3.  Dural/epidural mass: He is status post whole brain radiation without any residual complications. No new issues noted.  4. Hip pain: Reasonably controlled with hydrocodone. Periorbital to no pain noted at this time.  5. Anorexia: We have discussed strategies to boost his by mouth intake. I gave him prescription for Megace and will have a dietary evaluation today.  6. Androgen deprivation: He will continue on Lupron 30 mg every 4 months. He is due for an injection on 12/02/2016.  7. IV access: Port-A-Cath is used without complications.  8. Antiemetics: Prescription for Compazine will be made available to the patient before the start of chemotherapy.  9. Neutropenia prophylaxis: He will receive Neulasta after each chemotherapy cycle.  10. Prognosis: The goal of treatment is palliative at this time. He has a reasonable performance status and the plan is to continue  with palliative systemic chemotherapy.  11. Follow-up: Will be in 3 weeks for the next cycle of chemotherapy.     [facility], MD 2/16/20188:35 AM

## 2016-12-03 ENCOUNTER — Encounter: Payer: Self-pay | Admitting: *Deleted

## 2016-12-03 ENCOUNTER — Ambulatory Visit (HOSPITAL_BASED_OUTPATIENT_CLINIC_OR_DEPARTMENT_OTHER): Payer: Medicare Other

## 2016-12-03 ENCOUNTER — Other Ambulatory Visit: Payer: Self-pay | Admitting: *Deleted

## 2016-12-03 VITALS — BP 119/74 | HR 98 | Temp 97.0°F | Resp 18

## 2016-12-03 DIAGNOSIS — Z5189 Encounter for other specified aftercare: Secondary | ICD-10-CM | POA: Diagnosis not present

## 2016-12-03 DIAGNOSIS — C61 Malignant neoplasm of prostate: Secondary | ICD-10-CM

## 2016-12-03 DIAGNOSIS — C7951 Secondary malignant neoplasm of bone: Principal | ICD-10-CM

## 2016-12-03 LAB — PSA: PROSTATE SPECIFIC AG, SERUM: 254.9 ng/mL — AB (ref 0.0–4.0)

## 2016-12-03 MED ORDER — TRAMADOL HCL 50 MG PO TABS: ORAL_TABLET | ORAL | 0 refills | Status: DC

## 2016-12-03 MED ORDER — PEGFILGRASTIM INJECTION 6 MG/0.6ML ~~LOC~~
6.0000 mg | PREFILLED_SYRINGE | Freq: Once | SUBCUTANEOUS | Status: AC
  Administered 2016-12-03: 6 mg via SUBCUTANEOUS

## 2016-12-03 MED ORDER — NAPROXEN 500 MG PO TABS: ORAL_TABLET | ORAL | 0 refills | Status: DC

## 2016-12-03 MED ORDER — NAPROXEN SODIUM 220 MG PO TABS: ORAL_TABLET | ORAL | 0 refills | Status: DC

## 2016-12-03 NOTE — Patient Instructions (Signed)
Pegfilgrastim injection What is this medicine? PEGFILGRASTIM (PEG fil gra stim) is a long-acting granulocyte colony-stimulating factor that stimulates the growth of neutrophils, a type of white blood cell important in the body's fight against infection. It is used to reduce the incidence of fever and infection in patients with certain types of cancer who are receiving chemotherapy that affects the bone marrow, and to increase survival after being exposed to high doses of radiation. This medicine may be used for other purposes; ask your health care provider or pharmacist if you have questions. COMMON BRAND NAME(S): Neulasta What should I tell my health care provider before I take this medicine? They need to know if you have any of these conditions: -kidney disease -latex allergy -ongoing radiation therapy -sickle cell disease -skin reactions to acrylic adhesives (On-Body Injector only) -an unusual or allergic reaction to pegfilgrastim, filgrastim, other medicines, foods, dyes, or preservatives -pregnant or trying to get pregnant -breast-feeding How should I use this medicine? This medicine is for injection under the skin. If you get this medicine at home, you will be taught how to prepare and give the pre-filled syringe or how to use the On-body Injector. Refer to the patient Instructions for Use for detailed instructions. Use exactly as directed. Take your medicine at regular intervals. Do not take your medicine more often than directed. It is important that you put your used needles and syringes in a special sharps container. Do not put them in a trash can. If you do not have a sharps container, call your pharmacist or healthcare provider to get one. Talk to your pediatrician regarding the use of this medicine in children. While this drug may be prescribed for selected conditions, precautions do apply. Overdosage: If you think you have taken too much of this medicine contact a poison control  center or emergency room at once. NOTE: This medicine is only for you. Do not share this medicine with others. What if I miss a dose? It is important not to miss your dose. Call your doctor or health care professional if you miss your dose. If you miss a dose due to an On-body Injector failure or leakage, a new dose should be administered as soon as possible using a single prefilled syringe for manual use. What may interact with this medicine? Interactions have not been studied. Give your health care provider a list of all the medicines, herbs, non-prescription drugs, or dietary supplements you use. Also tell them if you smoke, drink alcohol, or use illegal drugs. Some items may interact with your medicine. This list may not describe all possible interactions. Give your health care provider a list of all the medicines, herbs, non-prescription drugs, or dietary supplements you use. Also tell them if you smoke, drink alcohol, or use illegal drugs. Some items may interact with your medicine. What should I watch for while using this medicine? You may need blood work done while you are taking this medicine. If you are going to need a MRI, CT scan, or other procedure, tell your doctor that you are using this medicine (On-Body Injector only). What side effects may I notice from receiving this medicine? Side effects that you should report to your doctor or health care professional as soon as possible: -allergic reactions like skin rash, itching or hives, swelling of the face, lips, or tongue -dizziness -fever -pain, redness, or irritation at site where injected -pinpoint red spots on the skin -red or dark-brown urine -shortness of breath or breathing problems -stomach or   side pain, or pain at the shoulder -swelling -tiredness -trouble passing urine or change in the amount of urine Side effects that usually do not require medical attention (report to your doctor or health care professional if they  continue or are bothersome): -bone pain -muscle pain This list may not describe all possible side effects. Call your doctor for medical advice about side effects. You may report side effects to FDA at 1-800-FDA-1088. Where should I keep my medicine? Keep out of the reach of children. Store pre-filled syringes in a refrigerator between 2 and 8 degrees C (36 and 46 degrees F). Do not freeze. Keep in carton to protect from light. Throw away this medicine if it is left out of the refrigerator for more than 48 hours. Throw away any unused medicine after the expiration date. NOTE: This sheet is a summary. It may not cover all possible information. If you have questions about this medicine, talk to your doctor, pharmacist, or health care provider.  2017 Elsevier/Gold Standard (2014-10-23 14:30:14)  

## 2016-12-03 NOTE — Progress Notes (Signed)
Patient here for injection and states that he is out of Hydrocodone which was prescribed for him on 11/08/16 in the ER for hip pain.  Patient was given 10 tablets on 11/08/16.  Patient here for office visit with Dr. [name] on 12/02/16.  Call placed to Dr. Jana Hakim to notify him of patient's complaints of hip pain.  Order received for Tramadol and Aleve per Dr. Jana Hakim.  Patient requests that prescriptions [facility] [address].  Will notify Dr. Hazeline Junker nurse about need for refill.  Patient appreciative of assistance and has no other concerns at this time.

## 2016-12-07 ENCOUNTER — Encounter: Payer: Self-pay | Admitting: *Deleted

## 2016-12-07 NOTE — Progress Notes (Signed)
Patient's son called and stated patient has a $1500 water bill.  Patient was not residing at the residence due to no heat.  Patient stopped by and requested CSW contact [facility] on his behalf.  CSW discussed with Percell Miller at [facility] and patient.  Patient's landlord plans to "handle this" per patient's report.  CSW and patient will follow up with [place] next week.   Polo Riley, MSW, LCSW, OSW-C Clinical Social Worker [facility] [phone]

## 2016-12-19 ENCOUNTER — Other Ambulatory Visit: Payer: Self-pay | Admitting: Medical

## 2016-12-20 ENCOUNTER — Other Ambulatory Visit: Payer: Self-pay | Admitting: Medical

## 2016-12-20 ENCOUNTER — Other Ambulatory Visit: Payer: Self-pay | Admitting: *Deleted

## 2016-12-22 ENCOUNTER — Other Ambulatory Visit: Payer: Self-pay | Admitting: *Deleted

## 2016-12-22 DIAGNOSIS — C61 Malignant neoplasm of prostate: Secondary | ICD-10-CM

## 2016-12-23 ENCOUNTER — Ambulatory Visit (HOSPITAL_BASED_OUTPATIENT_CLINIC_OR_DEPARTMENT_OTHER): Payer: Medicare Other

## 2016-12-23 ENCOUNTER — Ambulatory Visit (HOSPITAL_BASED_OUTPATIENT_CLINIC_OR_DEPARTMENT_OTHER): Payer: Medicare Other | Admitting: Oncology

## 2016-12-23 ENCOUNTER — Other Ambulatory Visit (HOSPITAL_BASED_OUTPATIENT_CLINIC_OR_DEPARTMENT_OTHER): Payer: Medicare Other

## 2016-12-23 ENCOUNTER — Telehealth: Payer: Self-pay | Admitting: Oncology

## 2016-12-23 ENCOUNTER — Ambulatory Visit: Payer: Medicare Other

## 2016-12-23 ENCOUNTER — Other Ambulatory Visit: Payer: Self-pay | Admitting: *Deleted

## 2016-12-23 VITALS — BP 107/61 | HR 87 | Temp 97.5°F | Resp 16 | Ht 70.0 in | Wt 165.1 lb

## 2016-12-23 DIAGNOSIS — C61 Malignant neoplasm of prostate: Secondary | ICD-10-CM | POA: Diagnosis present

## 2016-12-23 DIAGNOSIS — D6481 Anemia due to antineoplastic chemotherapy: Secondary | ICD-10-CM | POA: Diagnosis not present

## 2016-12-23 DIAGNOSIS — C7931 Secondary malignant neoplasm of brain: Secondary | ICD-10-CM

## 2016-12-23 DIAGNOSIS — T451X5A Adverse effect of antineoplastic and immunosuppressive drugs, initial encounter: Secondary | ICD-10-CM

## 2016-12-23 DIAGNOSIS — C7951 Secondary malignant neoplasm of bone: Secondary | ICD-10-CM

## 2016-12-23 DIAGNOSIS — Z5111 Encounter for antineoplastic chemotherapy: Secondary | ICD-10-CM

## 2016-12-23 DIAGNOSIS — E291 Testicular hypofunction: Secondary | ICD-10-CM | POA: Diagnosis not present

## 2016-12-23 LAB — COMPREHENSIVE METABOLIC PANEL
ALBUMIN: 3.1 g/dL — AB (ref 3.5–5.0)
ALK PHOS: 187 U/L — AB (ref 40–150)
ALT: 12 U/L (ref 0–55)
ANION GAP: 9 meq/L (ref 3–11)
AST: 34 U/L (ref 5–34)
BILIRUBIN TOTAL: 0.48 mg/dL (ref 0.20–1.20)
BUN: 18.5 mg/dL (ref 7.0–26.0)
CALCIUM: 9.4 mg/dL (ref 8.4–10.4)
CO2: 18 meq/L — AB (ref 22–29)
CREATININE: 0.8 mg/dL (ref 0.7–1.3)
Chloride: 112 mEq/L — ABNORMAL HIGH (ref 98–109)
Glucose: 104 mg/dl (ref 70–140)
Potassium: 4.3 mEq/L (ref 3.5–5.1)
Sodium: 139 mEq/L (ref 136–145)
TOTAL PROTEIN: 6.7 g/dL (ref 6.4–8.3)

## 2016-12-23 LAB — CBC WITH DIFFERENTIAL/PLATELET
BASO%: 0.5 % (ref 0.0–2.0)
Basophils Absolute: 0.1 10*3/uL (ref 0.0–0.1)
EOS ABS: 0 10*3/uL (ref 0.0–0.5)
EOS%: 0.2 % (ref 0.0–7.0)
HEMATOCRIT: 23.7 % — AB (ref 38.4–49.9)
HEMOGLOBIN: 7.9 g/dL — AB (ref 13.0–17.1)
LYMPH#: 1.2 10*3/uL (ref 0.9–3.3)
LYMPH%: 11.3 % — ABNORMAL LOW (ref 14.0–49.0)
MCH: 27.8 pg (ref 27.2–33.4)
MCHC: 33.3 g/dL (ref 32.0–36.0)
MCV: 83.4 fL (ref 79.3–98.0)
MONO#: 0.7 10*3/uL (ref 0.1–0.9)
MONO%: 6.5 % (ref 0.0–14.0)
NEUT%: 81.5 % — ABNORMAL HIGH (ref 39.0–75.0)
NEUTROS ABS: 8.9 10*3/uL — AB (ref 1.5–6.5)
PLATELETS: 202 10*3/uL (ref 140–400)
RBC: 2.84 10*6/uL — ABNORMAL LOW (ref 4.20–5.82)
RDW: 21.1 % — ABNORMAL HIGH (ref 11.0–14.6)
WBC: 11 10*3/uL — AB (ref 4.0–10.3)

## 2016-12-23 MED ORDER — DEXAMETHASONE SODIUM PHOSPHATE 10 MG/ML IJ SOLN
INTRAMUSCULAR | Status: AC
  Filled 2016-12-23: qty 1

## 2016-12-23 MED ORDER — SODIUM CHLORIDE 0.9 % IV SOLN
Freq: Once | INTRAVENOUS | Status: AC
  Administered 2016-12-23: 13:00:00 via INTRAVENOUS

## 2016-12-23 MED ORDER — DEXAMETHASONE SODIUM PHOSPHATE 10 MG/ML IJ SOLN
10.0000 mg | Freq: Once | INTRAMUSCULAR | Status: AC
  Administered 2016-12-23: 10 mg via INTRAVENOUS

## 2016-12-23 MED ORDER — HEPARIN SOD (PORK) LOCK FLUSH 100 UNIT/ML IV SOLN
500.0000 [IU] | Freq: Once | INTRAVENOUS | Status: AC | PRN
  Administered 2016-12-23: 500 [IU]
  Filled 2016-12-23: qty 5

## 2016-12-23 MED ORDER — SODIUM CHLORIDE 0.9% FLUSH
10.0000 mL | INTRAVENOUS | Status: DC | PRN
  Administered 2016-12-23: 10 mL
  Filled 2016-12-23: qty 10

## 2016-12-23 MED ORDER — MEGESTROL ACETATE 400 MG/10ML PO SUSP: 400.0000 mg | Freq: Two times a day (BID) | ORAL | 0 refills | Status: DC

## 2016-12-23 MED ORDER — DOCETAXEL CHEMO INJECTION 160 MG/16ML
60.0000 mg/m2 | Freq: Once | INTRAVENOUS | Status: AC
  Administered 2016-12-23: 130 mg via INTRAVENOUS
  Filled 2016-12-23: qty 13

## 2016-12-23 MED ORDER — DENOSUMAB 120 MG/1.7ML ~~LOC~~ SOLN
120.0000 mg | Freq: Once | SUBCUTANEOUS | Status: AC
  Administered 2016-12-23: 120 mg via SUBCUTANEOUS
  Filled 2016-12-23: qty 1.7

## 2016-12-23 MED ORDER — SODIUM CHLORIDE 0.9 % IJ SOLN
10.0000 mL | Freq: Once | INTRAMUSCULAR | Status: AC
  Administered 2016-12-23: 10 mL
  Filled 2016-12-23: qty 10

## 2016-12-23 NOTE — Telephone Encounter (Signed)
Appt scheduled per 12/23/2016 los .Gave patient AVS and calender.

## 2016-12-23 NOTE — Progress Notes (Signed)
Hematology and Oncology Follow Up Visit  Steven Church [id number] 1951/06/11 66 y.o. 12/23/2016 10:47 AM   Principle Diagnosis: 66 year old gentleman with prostate cancer presented with diffuse bony metastasis and lymphadenopathy. His PSA is 899 with unknown Gleason score. His diagnosis was confirmed in June 2016. 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   Prior Therapy:  He is status post a biopsy obtained of the soft tissue in the retroperitoneal area on 04/03/2015. He is status post whole brain radiation for a total of 30 gray in 10 fractions completed in April 2017. Zytiga 1000 mg started in April 2017. Therapy discontinued in December 2017 because of progression of disease.   Current therapy:  Lupron 30 mg every 4 months.  Xgeva to start on 07/24/2015.  Taxotere chemotherapy with cycle one scheduled to start on 10/21/2016. He will receive 60 mg/m every 3 weeks. Here for cycle 4 of therapy.  Interim History:  Mr. Steven Church presents today for a follow-up visit. Since the last visit, he reports feeling reasonably well. He has been taking Megace regularly and have helped his appetite. He is eating better and have gained more weight. He remains ambulatory and attends to activities of daily living. He is able to drive and bring himself to appointments.  He received the last cycle of chemotherapy without complications. He denied any nausea, vomiting or peripheral neuropathy. He denied any excessive fatigue or tiredness. He denied any recurrent infections or hospitalizations. He denied any hematuria or dysuria. He denied any neurological deficits.  He still confused about his medication and his sister assist him with that. He has been taking potassium supplements which we have instructed him to stop.   He does not report any headaches, blurry vision, syncope or seizures. He does not report any fevers, chills, sweats. He does  not report any chest pain, palpitation, orthopnea or leg edema. He does not report any cough, shortness of breath, dyspnea on exertion, wheezing or hemoptysis. He does not report any nausea, vomiting, abdominal pain, hematochezia, or early satiety. He does not report any hematuria or dysuria, hesitancy and nocturia. He does not report any skeletal complaints of arthralgias or myalgias. Remaining review of systems unremarkable.  Medications: I have reviewed the patient's current medications.  Current Outpatient Prescriptions  Medication Sig Dispense Refill  . amLODipine (NORVASC) 10 MG tablet TAKE 1 TABLET BY MOUTH EVERY DAY 90 tablet 0  . calcitRIOL (ROCALTROL) 0.5 MCG capsule TAKE 1 CAPSULE TWICE A DAY 180 capsule 0  . calcium carbonate (TUMS - DOSED IN MG ELEMENTAL CALCIUM) 500 MG chewable tablet Chew 5 tablets (1,000 mg of elemental calcium total) by mouth 3 (three) times daily. 90 tablet 0  . [facility] OYSTER SHELL CALCIUM+VIT D 500-125 MG-UNIT TABS TAKE 1 TABLET BY MOUTH TWICE A DAY 60 tablet 3  . dexamethasone (DECADRON) 2 MG tablet Take 1 tablet (2 mg total) by mouth daily. 30 tablet 0  . finasteride (PROSCAR) 5 MG tablet Take 1 tablet (5 mg total) by mouth daily. 90 tablet 1  . HYDROcodone-acetaminophen (NORCO/VICODIN) 5-325 MG tablet Take 1-2 tablets by mouth every 4 (four) hours as needed for moderate pain or severe pain. 10 tablet 0  . lidocaine (XYLOCAINE) 5 % ointment Apply 1 application topically as needed. 30 g 1  . megestrol (MEGACE) 400 MG/10ML suspension Take 10 mLs (400 mg total) by mouth 2 (two) times daily. 240 mL 0  . naproxen (NAPROSYN) 500 MG tablet Take  three times a day with food as needed 30 tablet 0  . omeprazole (PRILOSEC) 40 MG capsule TAKE 1 CAPSULE (40 MG TOTAL) BY MOUTH DAILY. 90 capsule 0  . prochlorperazine (COMPAZINE) 10 MG tablet Take 1 tablet (10 mg total) by mouth every 6 (six) hours as needed for nausea or vomiting. 30 tablet 1  . tamsulosin (FLOMAX) 0.4 MG CAPS  capsule TAKE ONE CAPSULE BY MOUTH EVERY DAY 30 capsule 0  . temazepam (RESTORIL) 15 MG capsule Take 1 capsule (15 mg total) by mouth at bedtime as needed for sleep. 30 capsule 0  . traMADol (ULTRAM) 50 MG tablet One or two tablets four times a day as needed 60 tablet 0  . Vitamin D, Ergocalciferol, (DRISDOL) 50000 units CAPS capsule Take 1 capsule (50,000 Units total) by mouth every other day. 30 capsule 0   No current facility-administered medications for this visit.      Allergies:  Allergies  Allergen Reactions  . Losartan Potassium-Hctz Other (See Comments)    dizziness    Past Medical History, Surgical history, Social history, and Family History were reviewed and updated.   Physical Exam: Blood pressure 107/61, pulse 87, temperature 97.5 F (36.4 C), temperature source Oral, resp. rate 16, height 5\' 10"  (1.778 m), weight 165 lb 1.6 oz (74.9 kg), SpO2 100 %. ECOG: 1 General appearance: Elderly gentleman appeared without distress. Head: Normocephalic, without obvious abnormality no oral thrush noted. Neck: no adenopathy thyroid masses. Lymph nodes: Cervical, supraclavicular, and axillary nodes normal. Heart:regular rate and rhythm, S1, S2 normal, no murmur, click, rub or gallop Lung:chest clear, no wheezing, rales, normal symmetric air entry Abdomin: soft, non-tender, without masses or organomegaly no shifting dullness or ascites. EXT:no erythema, induration, or clubbing.  Neurological examination: No deficits noted.  Lab Results: Lab Results  Component Value Date   WBC 11.0 (H) 12/23/2016   HGB 7.9 (L) 12/23/2016   HCT 23.7 (L) 12/23/2016   MCV 83.4 12/23/2016   PLT 202 12/23/2016     Chemistry      Component Value Date/Time   NA 134 (L) 12/02/2016 0744   K 5.6 (H) 12/02/2016 0744   CL 108 11/08/2016 1850   CO2 18 (L) 12/02/2016 0744   BUN 14.2 12/02/2016 0744   CREATININE 1.0 12/02/2016 0744      Component Value Date/Time   CALCIUM 8.6 12/02/2016 0744    ALKPHOS 263 (H) 12/02/2016 0744   AST 35 (H) 12/02/2016 0744   ALT <6 12/02/2016 0744   BILITOT 0.46 12/02/2016 0744       Results for Steven, MANGUAL SR. (MRN [id number]) as of 12/23/2016 10:22  Ref. Range 10/21/2016 08:50 11/11/2016 08:06 12/02/2016 07:44  PSA Latest Ref Range: 0.0 - 4.0 ng/mL 201.5 (H) 161.9 (H) 254.9 (H)        Impression and Plan:  66 year old gentleman with the following issues:  1. Advanced prostate cancer presenting with diffuse bony metastasis and bulky lymphadenopathy and PSA of 899.8. This is biopsy proven to be prostate cancer.   He started in June 2016 but have developed castration-resistant disease with a PSA rise up to 702.5 As well as brain metastasis.  He is status post of Zytiga therapy which was discontinued in December 2017 because of progression of disease. His PSA was up to 114.8.  He is currently receiving Taxotere chemotherapy after progression of disease on Zytiga.   He tolerated last cycle without major complications. He is ready to proceed with next cycle today without any  dose reduction or delay. His PSA initially decreased to 164 but most recently was up to 254. His PSA continues to rise, I will discontinue chemotherapy at that time.  2. Bone directed therapy: He received Xgeva without any major complications. He will receive this with every other chemotherapy cycle.  3. Dural/epidural mass: He is status post whole brain radiation without any residual complications. No new issues noted.  4. Hip pain: Controlled at this time and he rarely takes any pain medication.  5. Anemia: Related to malignancy and chemotherapy. We will set him up with packed red cell transfusion in the near future to alleviate any worsening anemia symptoms.  6. Androgen deprivation: He will continue on Lupron 30 mg every 4 months and he will receive his next injection after 04/01/2017.  7. IV access: Port-A-Cath is used without complications.  8. Antiemetics:  Prescription for Compazine will be made available to the patient before the start of chemotherapy.  9. Neutropenia prophylaxis: He will receive Neulasta after each chemotherapy cycle.  10. Prognosis: Remains poor at this time with incurable advanced malignancy. His performance status remains adequate to resume chemotherapy at least for the time being. Consideration for discontinuation of therapy and hospice would be made as his PSA continues to rise and he exhibits signs of declined in the near future.  11. Follow-up: Will be in 3 weeks for the next cycle of chemotherapy.     [facility], MD 3/9/201810:47 AM

## 2016-12-23 NOTE — Patient Instructions (Signed)
[facility] Discharge Instructions for Patients Receiving Chemotherapy  Today you received the following chemotherapy agents Taxotere.  To help prevent nausea and vomiting after your treatment, we encourage you to take your nausea medication as prescribed.   If you develop nausea and vomiting that is not controlled by your nausea medication, call the clinic.   BELOW ARE SYMPTOMS THAT SHOULD BE REPORTED IMMEDIATELY:  *FEVER GREATER THAN 100.5 F  *CHILLS WITH OR WITHOUT FEVER  NAUSEA AND VOMITING THAT IS NOT CONTROLLED WITH YOUR NAUSEA MEDICATION  *UNUSUAL SHORTNESS OF BREATH  *UNUSUAL BRUISING OR BLEEDING  TENDERNESS IN MOUTH AND THROAT WITH OR WITHOUT PRESENCE OF ULCERS  *URINARY PROBLEMS  *BOWEL PROBLEMS  UNUSUAL RASH Items with * indicate a potential emergency and should be followed up as soon as possible.  Feel free to call the clinic you have any questions or concerns. The clinic phone number is [phone].  Please show the CHEMO ALERT CARD at check-in to the Emergency Department and triage nurse.   

## 2016-12-24 ENCOUNTER — Telehealth: Payer: Self-pay | Admitting: Medical Oncology

## 2016-12-24 ENCOUNTER — Other Ambulatory Visit: Payer: Self-pay | Admitting: Medical

## 2016-12-24 LAB — PSA: PROSTATE SPECIFIC AG, SERUM: 427.7 ng/mL — AB (ref 0.0–4.0)

## 2016-12-24 NOTE — Telephone Encounter (Signed)
Pt came back and said he needs a refill on his flomax-he has 3 pills left. I called his pharmacy and the pharmacist is waiting on Dr Glade Lloyd to call back with refill order. Pt notified to check with his pharmacy on Monday .

## 2016-12-24 NOTE — Telephone Encounter (Signed)
Pt presented to cancer center infusion with his pill boxes filled and asked nurse  to go over his medications. Pt instructed to stop the potassium which was in his pill box. Lorri showed pt his potassium tablets and he took all his potassium tablets out of the pill box .

## 2016-12-26 ENCOUNTER — Ambulatory Visit (HOSPITAL_COMMUNITY)
Admission: RE | Admit: 2016-12-26 | Discharge: 2016-12-26 | Disposition: A | Discharge status: Home / Self Care | Payer: Medicare Other | Source: Ambulatory Visit | Attending: Oncology | Admitting: Oncology

## 2016-12-26 ENCOUNTER — Other Ambulatory Visit: Payer: Medicare Other

## 2016-12-26 ENCOUNTER — Other Ambulatory Visit: Payer: Self-pay | Admitting: *Deleted

## 2016-12-26 ENCOUNTER — Other Ambulatory Visit: Payer: Self-pay

## 2016-12-26 ENCOUNTER — Ambulatory Visit: Payer: Medicare Other

## 2016-12-26 ENCOUNTER — Encounter: Payer: Self-pay | Admitting: *Deleted

## 2016-12-26 DIAGNOSIS — D630 Anemia in neoplastic disease: Secondary | ICD-10-CM | POA: Insufficient documentation

## 2016-12-26 DIAGNOSIS — C61 Malignant neoplasm of prostate: Secondary | ICD-10-CM

## 2016-12-26 DIAGNOSIS — T451X5A Adverse effect of antineoplastic and immunosuppressive drugs, initial encounter: Secondary | ICD-10-CM

## 2016-12-26 DIAGNOSIS — D6481 Anemia due to antineoplastic chemotherapy: Secondary | ICD-10-CM | POA: Insufficient documentation

## 2016-12-26 LAB — CBC WITH DIFFERENTIAL/PLATELET
BASO%: 0.7 % (ref 0.0–2.0)
BASOS ABS: 0 10*3/uL (ref 0.0–0.1)
EOS%: 0.3 % (ref 0.0–7.0)
Eosinophils Absolute: 0 10*3/uL (ref 0.0–0.5)
HEMATOCRIT: 23.5 % — AB (ref 38.4–49.9)
HEMOGLOBIN: 7.9 g/dL — AB (ref 13.0–17.1)
LYMPH#: 1.2 10*3/uL (ref 0.9–3.3)
LYMPH%: 19.2 % (ref 14.0–49.0)
MCH: 27.9 pg (ref 27.2–33.4)
MCHC: 33.4 g/dL (ref 32.0–36.0)
MCV: 83.4 fL (ref 79.3–98.0)
MONO#: 0.1 10*3/uL (ref 0.1–0.9)
MONO%: 1 % (ref 0.0–14.0)
NEUT#: 5.1 10*3/uL (ref 1.5–6.5)
NEUT%: 78.8 % — ABNORMAL HIGH (ref 39.0–75.0)
PLATELETS: 225 10*3/uL (ref 140–400)
RBC: 2.82 10*6/uL — ABNORMAL LOW (ref 4.20–5.82)
RDW: 21.5 % — AB (ref 11.0–14.6)
WBC: 6.4 10*3/uL (ref 4.0–10.3)

## 2016-12-26 LAB — PREPARE RBC (CROSSMATCH)

## 2016-12-26 LAB — ABO/RH: ABO/RH(D): B POS

## 2016-12-26 MED ORDER — PEGFILGRASTIM INJECTION 6 MG/0.6ML ~~LOC~~
6.0000 mg | PREFILLED_SYRINGE | Freq: Once | SUBCUTANEOUS | Status: AC
  Administered 2016-12-26: 6 mg via SUBCUTANEOUS
  Filled 2016-12-26: qty 0.6

## 2016-12-26 MED ORDER — SODIUM CHLORIDE 0.9% FLUSH: 3.0000 mL | INTRAVENOUS | Status: DC | PRN

## 2016-12-26 MED ORDER — DIPHENHYDRAMINE HCL 25 MG PO CAPS
25.0000 mg | ORAL_CAPSULE | Freq: Once | ORAL | Status: AC
  Administered 2016-12-26: 25 mg via ORAL
  Filled 2016-12-26: qty 1

## 2016-12-26 MED ORDER — SODIUM CHLORIDE 0.9 % IV SOLN
250.0000 mL | Freq: Once | INTRAVENOUS | Status: AC
  Administered 2016-12-26: 250 mL via INTRAVENOUS

## 2016-12-26 MED ORDER — ACETAMINOPHEN 325 MG PO TABS
650.0000 mg | ORAL_TABLET | Freq: Once | ORAL | Status: AC
  Administered 2016-12-26: 650 mg via ORAL
  Filled 2016-12-26: qty 2

## 2016-12-26 MED ORDER — HEPARIN SOD (PORK) LOCK FLUSH 100 UNIT/ML IV SOLN: 250.0000 [IU] | INTRAVENOUS | Status: DC | PRN

## 2016-12-26 MED ORDER — PEGFILGRASTIM INJECTION 6 MG/0.6ML ~~LOC~~: 6.0000 mg | PREFILLED_SYRINGE | Freq: Once | SUBCUTANEOUS | 0 refills | Status: AC

## 2016-12-26 MED ORDER — SODIUM CHLORIDE 0.9% FLUSH
10.0000 mL | INTRAVENOUS | Status: AC | PRN
  Administered 2016-12-26: 10 mL

## 2016-12-26 MED ORDER — HEPARIN SOD (PORK) LOCK FLUSH 100 UNIT/ML IV SOLN
500.0000 [IU] | Freq: Every day | INTRAVENOUS | Status: AC | PRN
  Administered 2016-12-26: 500 [IU]
  Filled 2016-12-26: qty 5

## 2016-12-26 NOTE — Progress Notes (Signed)
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 Patient tolerated procedure well with no transfusion reaction. Discharge instructions given to patient and patient states an understanding. Patient alert, oriented, and ambulatory at time of discharge.

## 2016-12-26 NOTE — Discharge Instructions (Signed)

## 2016-12-27 LAB — TYPE AND SCREEN
ABO/RH(D): B POS
ANTIBODY SCREEN: NEGATIVE
UNIT DIVISION: 0
UNIT DIVISION: 0

## 2016-12-27 LAB — BPAM RBC
Blood Product Expiration Date: 201803142359
Blood Product Expiration Date: 201804102359
ISSUE DATE / TIME: 201803121012
ISSUE DATE / TIME: 201803121012
UNIT TYPE AND RH: 7300
Unit Type and Rh: 1700

## 2017-01-11 ENCOUNTER — Other Ambulatory Visit: Payer: Self-pay | Admitting: Medical

## 2017-01-11 NOTE — Telephone Encounter (Signed)
I need to see him in f/u or physical.  He has mainly been seeing oncology due to cancer diagnosis.  They really should be prescribing this if he needs to be on this.   So please request appt, and ask pharmacy to reach to oncology about this medication

## 2017-01-11 NOTE — Telephone Encounter (Signed)
Is this okay to refill? 

## 2017-01-13 ENCOUNTER — Telehealth: Payer: Self-pay | Admitting: Oncology

## 2017-01-13 ENCOUNTER — Other Ambulatory Visit (HOSPITAL_BASED_OUTPATIENT_CLINIC_OR_DEPARTMENT_OTHER): Payer: Medicare Other

## 2017-01-13 ENCOUNTER — Encounter: Payer: Self-pay | Admitting: Medical Oncology

## 2017-01-13 ENCOUNTER — Ambulatory Visit (HOSPITAL_BASED_OUTPATIENT_CLINIC_OR_DEPARTMENT_OTHER): Payer: Medicare Other

## 2017-01-13 ENCOUNTER — Ambulatory Visit (HOSPITAL_BASED_OUTPATIENT_CLINIC_OR_DEPARTMENT_OTHER): Payer: Medicare Other | Admitting: Oncology

## 2017-01-13 ENCOUNTER — Ambulatory Visit: Payer: Medicare Other

## 2017-01-13 VITALS — BP 119/72 | HR 98 | Temp 98.7°F | Resp 18 | Ht 70.0 in | Wt 169.4 lb

## 2017-01-13 DIAGNOSIS — Z95828 Presence of other vascular implants and grafts: Secondary | ICD-10-CM

## 2017-01-13 DIAGNOSIS — C61 Malignant neoplasm of prostate: Secondary | ICD-10-CM | POA: Diagnosis present

## 2017-01-13 DIAGNOSIS — C7931 Secondary malignant neoplasm of brain: Secondary | ICD-10-CM

## 2017-01-13 DIAGNOSIS — E291 Testicular hypofunction: Secondary | ICD-10-CM

## 2017-01-13 DIAGNOSIS — C7951 Secondary malignant neoplasm of bone: Secondary | ICD-10-CM

## 2017-01-13 DIAGNOSIS — Z5111 Encounter for antineoplastic chemotherapy: Secondary | ICD-10-CM

## 2017-01-13 DIAGNOSIS — H05229 Edema of unspecified orbit: Secondary | ICD-10-CM | POA: Diagnosis not present

## 2017-01-13 LAB — COMPREHENSIVE METABOLIC PANEL
ALT: 12 U/L (ref 0–55)
ANION GAP: 11 meq/L (ref 3–11)
AST: 26 U/L (ref 5–34)
Albumin: 3.1 g/dL — ABNORMAL LOW (ref 3.5–5.0)
Alkaline Phosphatase: 131 U/L (ref 40–150)
BUN: 9.7 mg/dL (ref 7.0–26.0)
CHLORIDE: 109 meq/L (ref 98–109)
CO2: 20 meq/L — AB (ref 22–29)
Calcium: 9 mg/dL (ref 8.4–10.4)
Creatinine: 0.8 mg/dL (ref 0.7–1.3)
GLUCOSE: 97 mg/dL (ref 70–140)
Potassium: 3.8 mEq/L (ref 3.5–5.1)
SODIUM: 140 meq/L (ref 136–145)
Total Bilirubin: 0.49 mg/dL (ref 0.20–1.20)
Total Protein: 6.6 g/dL (ref 6.4–8.3)

## 2017-01-13 LAB — CBC WITH DIFFERENTIAL/PLATELET
BASO%: 0.6 % (ref 0.0–2.0)
Basophils Absolute: 0.1 10*3/uL (ref 0.0–0.1)
EOS ABS: 0 10*3/uL (ref 0.0–0.5)
EOS%: 0.1 % (ref 0.0–7.0)
HCT: 26.9 % — ABNORMAL LOW (ref 38.4–49.9)
HGB: 9.2 g/dL — ABNORMAL LOW (ref 13.0–17.1)
LYMPH%: 10.4 % — AB (ref 14.0–49.0)
MCH: 29.3 pg (ref 27.2–33.4)
MCHC: 34.1 g/dL (ref 32.0–36.0)
MCV: 85.8 fL (ref 79.3–98.0)
MONO#: 0.8 10*3/uL (ref 0.1–0.9)
MONO%: 7.6 % (ref 0.0–14.0)
NEUT%: 81.3 % — AB (ref 39.0–75.0)
NEUTROS ABS: 8.7 10*3/uL — AB (ref 1.5–6.5)
PLATELETS: 237 10*3/uL (ref 140–400)
RBC: 3.14 10*6/uL — AB (ref 4.20–5.82)
RDW: 20.7 % — ABNORMAL HIGH (ref 11.0–14.6)
WBC: 10.7 10*3/uL — ABNORMAL HIGH (ref 4.0–10.3)
lymph#: 1.1 10*3/uL (ref 0.9–3.3)

## 2017-01-13 MED ORDER — HEPARIN SOD (PORK) LOCK FLUSH 100 UNIT/ML IV SOLN
500.0000 [IU] | Freq: Once | INTRAVENOUS | Status: AC | PRN
  Administered 2017-01-13: 500 [IU]
  Filled 2017-01-13: qty 5

## 2017-01-13 MED ORDER — DOCETAXEL CHEMO INJECTION 160 MG/16ML
60.0000 mg/m2 | Freq: Once | INTRAVENOUS | Status: AC
  Administered 2017-01-13: 130 mg via INTRAVENOUS
  Filled 2017-01-13: qty 13

## 2017-01-13 MED ORDER — SODIUM CHLORIDE 0.9 % IV SOLN
Freq: Once | INTRAVENOUS | Status: AC
  Administered 2017-01-13: 10:00:00 via INTRAVENOUS

## 2017-01-13 MED ORDER — ACETAMINOPHEN 325 MG PO TABS
ORAL_TABLET | ORAL | Status: AC
  Filled 2017-01-13: qty 2

## 2017-01-13 MED ORDER — DEXAMETHASONE SODIUM PHOSPHATE 10 MG/ML IJ SOLN
10.0000 mg | Freq: Once | INTRAMUSCULAR | Status: AC
  Administered 2017-01-13: 10 mg via INTRAVENOUS

## 2017-01-13 MED ORDER — SODIUM CHLORIDE 0.9% FLUSH
10.0000 mL | INTRAVENOUS | Status: DC | PRN
  Administered 2017-01-13: 10 mL
  Filled 2017-01-13: qty 10

## 2017-01-13 MED ORDER — DEXAMETHASONE SODIUM PHOSPHATE 10 MG/ML IJ SOLN
INTRAMUSCULAR | Status: AC
  Filled 2017-01-13: qty 1

## 2017-01-13 MED ORDER — ACETAMINOPHEN 325 MG PO TABS
650.0000 mg | ORAL_TABLET | Freq: Once | ORAL | Status: AC
  Administered 2017-01-13: 650 mg via ORAL

## 2017-01-13 MED ORDER — SODIUM CHLORIDE 0.9% FLUSH
10.0000 mL | INTRAVENOUS | Status: AC | PRN
  Administered 2017-01-13: 10 mL
  Filled 2017-01-13: qty 10

## 2017-01-13 NOTE — Patient Instructions (Signed)
Implanted Port Home Guide An implanted port is a type of central line that is placed under the skin. Central lines are used to provide IV access when treatment or nutrition needs to be given through a person's veins. Implanted ports are used for long-term IV access. An implanted port may be placed because:  You need IV medicine that would be irritating to the small veins in your hands or arms.  You need long-term IV medicines, such as antibiotics.  You need IV nutrition for a long period.  You need frequent blood draws for lab tests.  You need dialysis.  Implanted ports are usually placed in the chest area, but they can also be placed in the upper arm, the abdomen, or the leg. An implanted port has two main parts:  Reservoir. The reservoir is round and will appear as a small, raised area under your skin. The reservoir is the part where a needle is inserted to give medicines or draw blood.  Catheter. The catheter is a thin, flexible tube that extends from the reservoir. The catheter is placed into a large vein. Medicine that is inserted into the reservoir goes into the catheter and then into the vein.  How will I care for my incision site? Do not get the incision site wet. Bathe or shower as directed by your health care provider. How is my port accessed? Special steps must be taken to access the port:  Before the port is accessed, a numbing cream can be placed on the skin. This helps numb the skin over the port site.  Your health care provider uses a sterile technique to access the port. ? Your health care provider must put on a mask and sterile gloves. ? The skin over your port is cleaned carefully with an antiseptic and allowed to dry. ? The port is gently pinched between sterile gloves, and a needle is inserted into the port.  Only "non-coring" port needles should be used to access the port. Once the port is accessed, a blood return should be checked. This helps ensure that the port  is in the vein and is not clogged.  If your port needs to remain accessed for a constant infusion, a clear (transparent) bandage will be placed over the needle site. The bandage and needle will need to be changed every week, or as directed by your health care provider.  Keep the bandage covering the needle clean and dry. Do not get it wet. Follow your health care provider's instructions on how to take a shower or bath while the port is accessed.  If your port does not need to stay accessed, no bandage is needed over the port.  What is flushing? Flushing helps keep the port from getting clogged. Follow your health care provider's instructions on how and when to flush the port. Ports are usually flushed with saline solution or a medicine called heparin. The need for flushing will depend on how the port is used.  If the port is used for intermittent medicines or blood draws, the port will need to be flushed: ? After medicines have been given. ? After blood has been drawn. ? As part of routine maintenance.  If a constant infusion is running, the port may not need to be flushed.  How long will my port stay implanted? The port can stay in for as long as your health care provider thinks it is needed. When it is time for the port to come out, surgery will be   done to remove it. The procedure is similar to the one performed when the port was put in. When should I seek immediate medical care? When you have an implanted port, you should seek immediate medical care if:  You notice a bad smell coming from the incision site.  You have swelling, redness, or drainage at the incision site.  You have more swelling or pain at the port site or the surrounding area.  You have a fever that is not controlled with medicine.  This information is not intended to replace advice given to you by your health care provider. Make sure you discuss any questions you have with your health care provider. Document  Released: 10/03/2005 Document Revised: 03/10/2016 Document Reviewed: 06/10/2013 Elsevier Interactive Patient Education  2017 [employer]  

## 2017-01-13 NOTE — Patient Instructions (Signed)
[facility] Discharge Instructions for Patients Receiving Chemotherapy  Today you received the following chemotherapy agents Taxotere.  To help prevent nausea and vomiting after your treatment, we encourage you to take your nausea medication as prescribed.   If you develop nausea and vomiting that is not controlled by your nausea medication, call the clinic.   BELOW ARE SYMPTOMS THAT SHOULD BE REPORTED IMMEDIATELY:  *FEVER GREATER THAN 100.5 F  *CHILLS WITH OR WITHOUT FEVER  NAUSEA AND VOMITING THAT IS NOT CONTROLLED WITH YOUR NAUSEA MEDICATION  *UNUSUAL SHORTNESS OF BREATH  *UNUSUAL BRUISING OR BLEEDING  TENDERNESS IN MOUTH AND THROAT WITH OR WITHOUT PRESENCE OF ULCERS  *URINARY PROBLEMS  *BOWEL PROBLEMS  UNUSUAL RASH Items with * indicate a potential emergency and should be followed up as soon as possible.  Feel free to call the clinic you have any questions or concerns. The clinic phone number is [phone].  Please show the CHEMO ALERT CARD at check-in to the Emergency Department and triage nurse.   

## 2017-01-13 NOTE — Telephone Encounter (Signed)
Scheduled appts per 01/13/2017 los. Patient in treatment area [facility] taking him his schedule.

## 2017-01-13 NOTE — Progress Notes (Signed)
Hematology and Oncology Follow Up Visit  Steven Church [id number] 10/23/1950 66 y.o. 01/13/2017 9:37 AM   Principle Diagnosis: 66 year old gentleman with prostate cancer presented with diffuse bony metastasis and lymphadenopathy. His PSA is 899 with unknown Gleason score. His diagnosis was confirmed in June 2016. 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   Prior Therapy:  He is status post a biopsy obtained of the soft tissue in the retroperitoneal area on 04/03/2015. He is status post whole brain radiation for a total of 30 gray in 10 fractions completed in April 2017. Zytiga 1000 mg started in April 2017. Therapy discontinued in December 2017 because of progression of disease.   Current therapy:  Lupron 30 mg every 4 months.  Xgeva to start on 07/24/2015.  Taxotere chemotherapy with cycle one scheduled to start on 10/21/2016. He will receive 60 mg/m every 3 weeks. Here for cycle 4 of therapy.  Interim History:  Steven Church presents today for a follow-up visit. Since the last visit, he reports no major changes in his health. He continues to eat better and have gained more weight at this time. He did have one episode of fall after tripping on a step and updated. She has had. He denied any loss of consciousness or confusion. He denied any headaches or blurry vision. He still able to drive and attends to activities of daily living.  He received the last cycle of chemotherapy without complications. He denied any nausea, vomiting or peripheral neuropathy. He denied any excessive fatigue or tiredness. His quality of life is not dramatically different.   He does not report any headaches, blurry vision, syncope or seizures. He does not report any fevers, chills, sweats. He does not report any chest pain, palpitation, orthopnea or leg edema. He does not report any cough, shortness of breath, dyspnea on exertion, wheezing or  hemoptysis. He does not report any nausea, vomiting, abdominal pain, hematochezia, or early satiety. He does not report any hematuria or dysuria, hesitancy and nocturia. He does not report any skeletal complaints of arthralgias or myalgias. Remaining review of systems unremarkable.  Medications: I have reviewed the patient's current medications.  Current Outpatient Prescriptions  Medication Sig Dispense Refill  . amLODipine (NORVASC) 10 MG tablet TAKE 1 TABLET BY MOUTH EVERY DAY 90 tablet 0  . calcitRIOL (ROCALTROL) 0.5 MCG capsule TAKE 1 CAPSULE TWICE A DAY 180 capsule 0  . calcium carbonate (TUMS - DOSED IN MG ELEMENTAL CALCIUM) 500 MG chewable tablet Chew 5 tablets (1,000 mg of elemental calcium total) by mouth 3 (three) times daily. 90 tablet 0  . [facility] OYSTER SHELL CALCIUM+VIT D 500-125 MG-UNIT TABS TAKE 1 TABLET BY MOUTH TWICE A DAY 60 tablet 3  . dexamethasone (DECADRON) 2 MG tablet Take 1 tablet (2 mg total) by mouth daily. 30 tablet 0  . finasteride (PROSCAR) 5 MG tablet TAKE 1 TABLET BY MOUTH EVERY DAY 90 tablet 1  . HYDROcodone-acetaminophen (NORCO/VICODIN) 5-325 MG tablet Take 1-2 tablets by mouth every 4 (four) hours as needed for moderate pain or severe pain. 10 tablet 0  . lidocaine (XYLOCAINE) 5 % ointment Apply 1 application topically as needed. 30 g 1  . megestrol (MEGACE) 400 MG/10ML suspension Take 10 mLs (400 mg total) by mouth 2 (two) times daily. 240 mL 0  . naproxen (NAPROSYN) 500 MG tablet Take three times a day with food as needed 30 tablet 0  . omeprazole (PRILOSEC) 40 MG capsule TAKE  1 CAPSULE (40 MG TOTAL) BY MOUTH DAILY. 90 capsule 0  . prochlorperazine (COMPAZINE) 10 MG tablet Take 1 tablet (10 mg total) by mouth every 6 (six) hours as needed for nausea or vomiting. 30 tablet 1  . tamsulosin (FLOMAX) 0.4 MG CAPS capsule TAKE ONE CAPSULE BY MOUTH EVERY DAY 30 capsule 0  . temazepam (RESTORIL) 15 MG capsule Take 1 capsule (15 mg total) by mouth at bedtime as needed for  sleep. 30 capsule 0  . traMADol (ULTRAM) 50 MG tablet One or two tablets four times a day as needed 60 tablet 0  . Vitamin D, Ergocalciferol, (DRISDOL) 50000 units CAPS capsule Take 1 capsule (50,000 Units total) by mouth every other day. 30 capsule 0   No current facility-administered medications for this visit.      Allergies:  Allergies  Allergen Reactions  . Losartan Potassium-Hctz Other (See Comments)    dizziness    Past Medical History, Surgical history, Social history, and Family History were reviewed and updated.   Physical Exam: Blood pressure 119/72, pulse 98, temperature 98.7 F (37.1 C), temperature source Oral, resp. rate 18, height 5\' 10"  (1.778 m), weight 169 lb 6.4 oz (76.8 kg), SpO2 99 %. ECOG: 1 General appearance: Alert, awake gentleman without distress. Head: Normocephalic, without obvious abnormality. I could not palpate any tenderness. Periorbital edema noted bilaterally. Neck: no adenopathy thyroid masses. Lymph nodes: Cervical, supraclavicular, and axillary nodes normal. Heart:regular rate and rhythm, S1, S2 normal, no murmur, click, rub or gallop Lung:chest clear, no wheezing, rales, normal symmetric air entry Abdomin: soft, non-tender, without masses or organomegaly no rebound or guarding. EXT:no erythema, induration, or clubbing.  Neurological examination: No deficits noted. His cognition is intact.  Lab Results: Lab Results  Component Value Date   WBC 10.7 (H) 01/13/2017   HGB 9.2 (L) 01/13/2017   HCT 26.9 (L) 01/13/2017   MCV 85.8 01/13/2017   PLT 237 01/13/2017     Chemistry      Component Value Date/Time   NA 140 01/13/2017 0745   K 3.8 01/13/2017 0745   CL 108 11/08/2016 1850   CO2 20 (L) 01/13/2017 0745   BUN 9.7 01/13/2017 0745   CREATININE 0.8 01/13/2017 0745      Component Value Date/Time   CALCIUM 9.0 01/13/2017 0745   ALKPHOS 131 01/13/2017 0745   AST 26 01/13/2017 0745   ALT 12 01/13/2017 0745   BILITOT 0.49 01/13/2017  0745        Results for Steven Church, Steven SR. (MRN [id number]) as of 01/13/2017 08:40  Ref. Range 11/11/2016 08:06 12/02/2016 07:44 12/23/2016 09:58  PSA Latest Ref Range: 0.0 - 4.0 ng/mL 161.9 (H) 254.9 (H) 427.7 (H)        Impression and Plan:  66 year old gentleman with the following issues:  1. Advanced prostate cancer presenting with diffuse bony metastasis and bulky lymphadenopathy and PSA of 899.8. This is biopsy proven to be prostate cancer.   He started in June 2016 but have developed castration-resistant disease with a PSA rise up to 702.5 As well as brain metastasis.  He is status post of Zytiga therapy which was discontinued in December 2017 because of progression of disease. His PSA was up to 114.8.  He is currently receiving Taxotere chemotherapy after progression of disease on Zytiga.   He continues to tolerate chemotherapy reasonably well with some improvement in his quality of life. His PSA continues to rise however which could indicate progression of disease. The plan is to  continue with systemic chemotherapy and monitor his PSA. As long as he is experiencing clinical improvement, we will continue chemotherapy.  2. Bone directed therapy: He received Xgeva without any major complications. He will receive this with every other chemotherapy cycle.  3. Dural/epidural mass: He is status post whole brain radiation without any residual complications. He is experiencing some periorbital edema which could be related to dexamethasone.  4. Hip pain: Controlled at this time and he rarely takes any pain medication.  5. Anemia: Related to malignancy and chemotherapy. His hemoglobin is adequate after transfusion 3 weeks ago. We will continue to monitor that.  6. Androgen deprivation: He will continue on Lupron 30 mg every 4 months and he will receive his next injection after 04/01/2017.  7. IV access: Port-A-Cath is used without complications. This will be used for  chemotherapy.  8. Antiemetics: Prescription for Compazine will be made available to the patient before the start of chemotherapy.  9. Neutropenia prophylaxis: He will receive Neulasta after each chemotherapy cycle.  10. Prognosis: Remains poor at this time with incurable advanced malignancy. His performance status is adequate at this time. If his performance status continues to decline, hospice will be his next option.  11. Follow-up: Will be in 3 weeks for the next cycle of chemotherapy.     [facility], MD 3/30/20189:37 AM

## 2017-01-13 NOTE — Progress Notes (Signed)
Mr. Strollo states he is doing pretty well. He informed me that he had a fall this week and hit his head. He states it was due to an uneven sidewalk. He states his appetite has improved and he gained four pounds. He is for chemo today. Will continue to follow.

## 2017-01-14 ENCOUNTER — Ambulatory Visit (HOSPITAL_BASED_OUTPATIENT_CLINIC_OR_DEPARTMENT_OTHER): Payer: Medicare Other

## 2017-01-14 VITALS — BP 120/75 | HR 97 | Temp 98.6°F | Resp 22

## 2017-01-14 DIAGNOSIS — C7951 Secondary malignant neoplasm of bone: Principal | ICD-10-CM

## 2017-01-14 DIAGNOSIS — C61 Malignant neoplasm of prostate: Secondary | ICD-10-CM | POA: Diagnosis present

## 2017-01-14 DIAGNOSIS — Z5189 Encounter for other specified aftercare: Secondary | ICD-10-CM | POA: Diagnosis not present

## 2017-01-14 LAB — PSA: PROSTATE SPECIFIC AG, SERUM: 716.9 ng/mL — AB (ref 0.0–4.0)

## 2017-01-14 MED ORDER — PEGFILGRASTIM INJECTION 6 MG/0.6ML ~~LOC~~
6.0000 mg | PREFILLED_SYRINGE | Freq: Once | SUBCUTANEOUS | Status: AC
  Administered 2017-01-14: 6 mg via SUBCUTANEOUS

## 2017-01-14 NOTE — Patient Instructions (Signed)
Pegfilgrastim injection What is this medicine? PEGFILGRASTIM (PEG fil gra stim) is a long-acting granulocyte colony-stimulating factor that stimulates the growth of neutrophils, a type of white blood cell important in the body's fight against infection. It is used to reduce the incidence of fever and infection in patients with certain types of cancer who are receiving chemotherapy that affects the bone marrow, and to increase survival after being exposed to high doses of radiation. This medicine may be used for other purposes; ask your health care provider or pharmacist if you have questions. COMMON BRAND NAME(S): Neulasta What should I tell my health care provider before I take this medicine? They need to know if you have any of these conditions: -kidney disease -latex allergy -ongoing radiation therapy -sickle cell disease -skin reactions to acrylic adhesives (On-Body Injector only) -an unusual or allergic reaction to pegfilgrastim, filgrastim, other medicines, foods, dyes, or preservatives -pregnant or trying to get pregnant -breast-feeding How should I use this medicine? This medicine is for injection under the skin. If you get this medicine at home, you will be taught how to prepare and give the pre-filled syringe or how to use the On-body Injector. Refer to the patient Instructions for Use for detailed instructions. Use exactly as directed. Take your medicine at regular intervals. Do not take your medicine more often than directed. It is important that you put your used needles and syringes in a special sharps container. Do not put them in a trash can. If you do not have a sharps container, call your pharmacist or healthcare provider to get one. Talk to your pediatrician regarding the use of this medicine in children. While this drug may be prescribed for selected conditions, precautions do apply. Overdosage: If you think you have taken too much of this medicine contact a poison control  center or emergency room at once. NOTE: This medicine is only for you. Do not share this medicine with others. What if I miss a dose? It is important not to miss your dose. Call your doctor or health care professional if you miss your dose. If you miss a dose due to an On-body Injector failure or leakage, a new dose should be administered as soon as possible using a single prefilled syringe for manual use. What may interact with this medicine? Interactions have not been studied. Give your health care provider a list of all the medicines, herbs, non-prescription drugs, or dietary supplements you use. Also tell them if you smoke, drink alcohol, or use illegal drugs. Some items may interact with your medicine. This list may not describe all possible interactions. Give your health care provider a list of all the medicines, herbs, non-prescription drugs, or dietary supplements you use. Also tell them if you smoke, drink alcohol, or use illegal drugs. Some items may interact with your medicine. What should I watch for while using this medicine? You may need blood work done while you are taking this medicine. If you are going to need a MRI, CT scan, or other procedure, tell your doctor that you are using this medicine (On-Body Injector only). What side effects may I notice from receiving this medicine? Side effects that you should report to your doctor or health care professional as soon as possible: -allergic reactions like skin rash, itching or hives, swelling of the face, lips, or tongue -dizziness -fever -pain, redness, or irritation at site where injected -pinpoint red spots on the skin -red or dark-brown urine -shortness of breath or breathing problems -stomach or   side pain, or pain at the shoulder -swelling -tiredness -trouble passing urine or change in the amount of urine Side effects that usually do not require medical attention (report to your doctor or health care professional if they  continue or are bothersome): -bone pain -muscle pain This list may not describe all possible side effects. Call your doctor for medical advice about side effects. You may report side effects to FDA at 1-800-FDA-1088. Where should I keep my medicine? Keep out of the reach of children. Store pre-filled syringes in a refrigerator between 2 and 8 degrees C (36 and 46 degrees F). Do not freeze. Keep in carton to protect from light. Throw away this medicine if it is left out of the refrigerator for more than 48 hours. Throw away any unused medicine after the expiration date. NOTE: This sheet is a summary. It may not cover all possible information. If you have questions about this medicine, talk to your doctor, pharmacist, or health care provider.  2017 Elsevier/Gold Standard (2014-10-23 14:30:14)  

## 2017-01-20 ENCOUNTER — Other Ambulatory Visit: Payer: Self-pay | Admitting: Medical

## 2017-01-24 NOTE — Telephone Encounter (Signed)
I made him appt yesterday

## 2017-01-30 ENCOUNTER — Other Ambulatory Visit: Payer: Self-pay | Admitting: Medical

## 2017-02-01 ENCOUNTER — Other Ambulatory Visit: Payer: Self-pay | Admitting: Medical

## 2017-02-03 ENCOUNTER — Ambulatory Visit: Payer: Medicare Other

## 2017-02-03 ENCOUNTER — Encounter: Payer: Medicare Other | Admitting: Nutrition

## 2017-02-03 ENCOUNTER — Telehealth: Payer: Self-pay | Admitting: Medical

## 2017-02-03 ENCOUNTER — Other Ambulatory Visit: Payer: Medicare Other

## 2017-02-03 ENCOUNTER — Ambulatory Visit: Payer: Medicare Other | Admitting: Oncology

## 2017-02-03 NOTE — Telephone Encounter (Signed)
I don't see number for sister, but call sister to see if she thinks he needs to be in assisted living facility or nursing facility?  Does she think he is overall unsafe to be alone?  Has hospice been mentioned or discussed?  I know this is difficult to discuss, but I wanted to get some family input

## 2017-02-03 NOTE — Telephone Encounter (Signed)
Called and l/m for his sister to call us back.

## 2017-02-03 NOTE — Telephone Encounter (Signed)
Pt's sister, [name], called to let Audelia Acton know her concerns about pt before his appointment on 02/08/17 because she will not be able to come with him to the appointment. Pt's health is diminishing. He is losing weight, has memory loss, eye is swollen again. He lives alone but sister goes over to help get meds organized in pill box by day but this week he still took wrong days pills out and take them and/or may not take all of pills or something. Get appointment info mixed up and confused.Sister is worried about pt driving and being home to care for himself. Not sure that he is eating properly or at all. Pt's son should be coming with him to him appointment on 02/08/17.

## 2017-02-06 ENCOUNTER — Ambulatory Visit: Payer: Medicare Other

## 2017-02-06 ENCOUNTER — Emergency Department (HOSPITAL_COMMUNITY): Payer: Medicare Other

## 2017-02-06 ENCOUNTER — Other Ambulatory Visit: Payer: Self-pay | Admitting: *Deleted

## 2017-02-06 ENCOUNTER — Inpatient Hospital Stay (HOSPITAL_COMMUNITY)
Admission: EM | Admit: 2017-02-06 | Discharge: 2017-02-09 | DRG: 054 | Disposition: A | Discharge status: Skilled Nursing Facility | Payer: Medicare Other | Attending: Internal Medicine | Admitting: Internal Medicine

## 2017-02-06 ENCOUNTER — Encounter (HOSPITAL_COMMUNITY): Payer: Self-pay | Admitting: Emergency Medicine

## 2017-02-06 DIAGNOSIS — W19XXXA Unspecified fall, initial encounter: Secondary | ICD-10-CM | POA: Diagnosis present

## 2017-02-06 DIAGNOSIS — D496 Neoplasm of unspecified behavior of brain: Secondary | ICD-10-CM

## 2017-02-06 DIAGNOSIS — K219 Gastro-esophageal reflux disease without esophagitis: Secondary | ICD-10-CM | POA: Diagnosis present

## 2017-02-06 DIAGNOSIS — Z888 Allergy status to other drugs, medicaments and biological substances status: Secondary | ICD-10-CM

## 2017-02-06 DIAGNOSIS — Z66 Do not resuscitate: Secondary | ICD-10-CM

## 2017-02-06 DIAGNOSIS — Z923 Personal history of irradiation: Secondary | ICD-10-CM

## 2017-02-06 DIAGNOSIS — D638 Anemia in other chronic diseases classified elsewhere: Secondary | ICD-10-CM | POA: Diagnosis present

## 2017-02-06 DIAGNOSIS — D649 Anemia, unspecified: Secondary | ICD-10-CM | POA: Diagnosis present

## 2017-02-06 DIAGNOSIS — Z79899 Other long term (current) drug therapy: Secondary | ICD-10-CM

## 2017-02-06 DIAGNOSIS — T380X5A Adverse effect of glucocorticoids and synthetic analogues, initial encounter: Secondary | ICD-10-CM | POA: Diagnosis not present

## 2017-02-06 DIAGNOSIS — G935 Compression of brain: Secondary | ICD-10-CM | POA: Diagnosis present

## 2017-02-06 DIAGNOSIS — D72829 Elevated white blood cell count, unspecified: Secondary | ICD-10-CM | POA: Diagnosis present

## 2017-02-06 DIAGNOSIS — G9340 Encephalopathy, unspecified: Secondary | ICD-10-CM | POA: Diagnosis present

## 2017-02-06 DIAGNOSIS — Y92239 Unspecified place in hospital as the place of occurrence of the external cause: Secondary | ICD-10-CM | POA: Diagnosis not present

## 2017-02-06 DIAGNOSIS — Z809 Family history of malignant neoplasm, unspecified: Secondary | ICD-10-CM

## 2017-02-06 DIAGNOSIS — Z515 Encounter for palliative care: Secondary | ICD-10-CM | POA: Diagnosis present

## 2017-02-06 DIAGNOSIS — C7951 Secondary malignant neoplasm of bone: Secondary | ICD-10-CM | POA: Diagnosis present

## 2017-02-06 DIAGNOSIS — E876 Hypokalemia: Secondary | ICD-10-CM | POA: Diagnosis present

## 2017-02-06 DIAGNOSIS — G936 Cerebral edema: Secondary | ICD-10-CM | POA: Diagnosis present

## 2017-02-06 DIAGNOSIS — Z8249 Family history of ischemic heart disease and other diseases of the circulatory system: Secondary | ICD-10-CM

## 2017-02-06 DIAGNOSIS — I10 Essential (primary) hypertension: Secondary | ICD-10-CM | POA: Diagnosis present

## 2017-02-06 DIAGNOSIS — C61 Malignant neoplasm of prostate: Secondary | ICD-10-CM | POA: Diagnosis present

## 2017-02-06 DIAGNOSIS — Z87891 Personal history of nicotine dependence: Secondary | ICD-10-CM

## 2017-02-06 DIAGNOSIS — C7931 Secondary malignant neoplasm of brain: Secondary | ICD-10-CM | POA: Diagnosis not present

## 2017-02-06 DIAGNOSIS — Z9221 Personal history of antineoplastic chemotherapy: Secondary | ICD-10-CM

## 2017-02-06 LAB — COMPREHENSIVE METABOLIC PANEL
ALBUMIN: 3.1 g/dL — AB (ref 3.5–5.0)
ALK PHOS: 172 U/L — AB (ref 38–126)
ALT: 9 U/L — ABNORMAL LOW (ref 17–63)
AST: 30 U/L (ref 15–41)
Anion gap: 11 (ref 5–15)
BILIRUBIN TOTAL: 1.2 mg/dL (ref 0.3–1.2)
BUN: 9 mg/dL (ref 6–20)
CALCIUM: 9 mg/dL (ref 8.9–10.3)
CO2: 19 mmol/L — ABNORMAL LOW (ref 22–32)
Chloride: 109 mmol/L (ref 101–111)
Creatinine, Ser: 0.74 mg/dL (ref 0.61–1.24)
GFR calc Af Amer: >60 mL/min (ref >60)
GLUCOSE: 90 mg/dL (ref 65–99)
Potassium: 3.3 mmol/L — ABNORMAL LOW (ref 3.5–5.1)
Sodium: 139 mmol/L (ref 135–145)
TOTAL PROTEIN: 7 g/dL (ref 6.5–8.1)

## 2017-02-06 LAB — CBC WITH DIFFERENTIAL/PLATELET
BASOS PCT: 0 %
Basophils Absolute: 0 10*3/uL (ref 0.0–0.1)
Eosinophils Absolute: 0 10*3/uL (ref 0.0–0.7)
Eosinophils Relative: 0 %
HEMATOCRIT: 24.1 % — AB (ref 39.0–52.0)
HEMOGLOBIN: 8.3 g/dL — AB (ref 13.0–17.0)
LYMPHS ABS: 1.2 10*3/uL (ref 0.7–4.0)
LYMPHS PCT: 9 %
MCH: 29.9 pg (ref 26.0–34.0)
MCHC: 34.4 g/dL (ref 30.0–36.0)
MCV: 86.7 fL (ref 78.0–100.0)
Monocytes Absolute: 1 10*3/uL (ref 0.1–1.0)
Monocytes Relative: 7 %
NEUTROS ABS: 11.5 10*3/uL — AB (ref 1.7–7.7)
NEUTROS PCT: 84 %
Platelets: 206 10*3/uL (ref 150–400)
RBC: 2.78 MIL/uL — ABNORMAL LOW (ref 4.22–5.81)
RDW: 17.4 % — ABNORMAL HIGH (ref 11.5–15.5)
WBC: 13.8 10*3/uL — ABNORMAL HIGH (ref 4.0–10.5)

## 2017-02-06 LAB — URINALYSIS, ROUTINE W REFLEX MICROSCOPIC
Bacteria, UA: NONE SEEN
Bilirubin Urine: NEGATIVE
GLUCOSE, UA: NEGATIVE mg/dL
Ketones, ur: 80 mg/dL — AB
LEUKOCYTES UA: NEGATIVE
NITRITE: NEGATIVE
Protein, ur: NEGATIVE mg/dL
SPECIFIC GRAVITY, URINE: 1.012 (ref 1.005–1.030)
pH: 6 (ref 5.0–8.0)

## 2017-02-06 LAB — RAPID URINE DRUG SCREEN, HOSP PERFORMED
Amphetamines: NOT DETECTED
BARBITURATES: NOT DETECTED
Benzodiazepines: POSITIVE — AB
Cocaine: NOT DETECTED
OPIATES: NOT DETECTED
TETRAHYDROCANNABINOL: NOT DETECTED

## 2017-02-06 LAB — CK: CK TOTAL: 466 U/L — AB (ref 49–397)

## 2017-02-06 LAB — ETHANOL: Alcohol, Ethyl (B): <5 mg/dL (ref <5)

## 2017-02-06 MED ORDER — SODIUM CHLORIDE 0.9 % IV BOLUS (SEPSIS)
500.0000 mL | Freq: Once | INTRAVENOUS | Status: AC
  Administered 2017-02-06: 500 mL via INTRAVENOUS

## 2017-02-06 MED ORDER — KLOR-CON M20 20 MEQ PO TBCR: 20.0000 meq | EXTENDED_RELEASE_TABLET | Freq: Two times a day (BID) | ORAL | 0 refills | Status: DC

## 2017-02-06 NOTE — Telephone Encounter (Signed)
Pls add her cell number to this phone message so I see it or show me where her number is

## 2017-02-06 NOTE — Telephone Encounter (Signed)
Sister called pt said very concern about him  , he is having memory loss, not eating like he should. Forget to take his meds. Having a lot of confusion. He lives alone. His sister wants to be called when he come in to talk to you at the visit.

## 2017-02-06 NOTE — ED Provider Notes (Signed)
[facility] DEPT Provider Note   CSN: [id number] Arrival date & time: 02/06/17  1833     History   Chief Complaint Chief Complaint  Patient presents with  . Fall    HPI Steven PODGORSKI Sr. is a 66 y.o. male.  HPI Patient lives alone. Normally is ambulatory. Last seen normal was yesterday. Found by son to be wedged between the wall and his bed and unable to get up. EMS was called. Patient is unable to say how long he has been wedged between the bed and the wall. Denies any headache or neck pain. Questionable left leg weakness. Denies fever or chills. No vomiting or diarrhea. No cough or shortness of breath. Denies urinary symptoms. Past Medical History:  Diagnosis Date  . Bone cancer ([place])   . Brain cancer ([place])   . Family history of cancer   . Former smoker   . GERD (gastroesophageal reflux disease)   . Hypertension   . Obesity   . Prostate cancer ([place]) 04/07/2015   bone mets, started oncology 03/2015    Patient Active Problem List   Diagnosis Date Noted  . Advanced care planning/counseling discussion   . Dysarthria 03/11/2016  . Acute encephalopathy 03/11/2016  . Chronic anemia 03/11/2016  . CAP (community acquired pneumonia) 03/11/2016  . Hypokalemia   . Hypomagnesemia   . HCAP (healthcare-associated pneumonia)   . DNR (do not resuscitate) discussion   . Aphasia   . Prostate cancer metastatic to bone ([place])   . Palliative care encounter   . Brain metastasis ([place]) 01/25/2016  . Prostate cancer ([place]) 04/07/2015  . Retroperitoneal lymphadenopathy   . Mediastinal lymphadenopathy 03/12/2015  . Lung mass 03/09/2015  . Essential hypertension 07/11/2014  . Rhinitis, allergic 07/11/2014    Past Surgical History:  Procedure Laterality Date  . COLONOSCOPY     never  . IR GENERIC HISTORICAL  10/19/2016   IR FLUORO GUIDE PORT INSERTION RIGHT 10/19/2016 Markus Daft, MD [facility]  . IR GENERIC HISTORICAL  10/19/2016   IR US GUIDE VASC ACCESS RIGHT 10/19/2016 Markus Daft,  MD [facility]       Home Medications    Prior to Admission medications   Medication Sig Start Date End Date Taking? Authorizing Provider  amLODipine (NORVASC) 10 MG tablet TAKE 1 TABLET BY MOUTH EVERY DAY 02/02/17  Yes Camelia Eng Tysinger, PA-C  calcitRIOL (ROCALTROL) 0.5 MCG capsule TAKE 1 CAPSULE TWICE A DAY Patient taking differently: TAKE 1 CAPSULE PO TWICE A DAY 11/04/16  Yes Camelia Eng Tysinger, PA-C  [facility] O[facility] SHELL CALCIUM+VIT D 500-125 MG-UNIT TABS TAKE 1 TABLET BY MOUTH TWICE A DAY 12/01/16  Yes Camelia Eng Tysinger, PA-C  finasteride (PROSCAR) 5 MG tablet TAKE 1 TABLET BY MOUTH EVERY DAY 01/11/17  Yes Camelia Eng Tysinger, PA-C  omeprazole (PRILOSEC) 40 MG capsule TAKE 1 CAPSULE (40 MG TOTAL) BY MOUTH DAILY. 01/30/17  Yes Camelia Eng Tysinger, PA-C  tamsulosin (FLOMAX) 0.4 MG CAPS capsule TAKE ONE CAPSULE BY MOUTH EVERY DAY 01/23/17  Yes Camelia Eng Tysinger, PA-C  calcium carbonate (TUMS - DOSED IN MG ELEMENTAL CALCIUM) 500 MG chewable tablet Chew 5 tablets (1,000 mg of elemental calcium total) by mouth 3 (three) times daily. Patient not taking: Reported on 02/06/2017 03/16/16   Thurnell Lose, MD  dexamethasone (DECADRON) 2 MG tablet Take 1 tablet (2 mg total) by mouth daily. Patient not taking: Reported on 02/06/2017 04/26/16   Hayden Pedro, PA-C  HYDROcodone-acetaminophen (NORCO/VICODIN) 5-325 MG tablet Take 1-2  tablets by mouth every 4 (four) hours as needed for moderate pain or severe pain. Patient not taking: Reported on 02/06/2017 11/08/16   Duffy Bruce, MD  KLOR-CON M20 20 MEQ tablet Take 1 tablet (20 mEq total) by mouth 2 (two) times daily. 02/06/17   [name], MD  lidocaine (XYLOCAINE) 5 % ointment Apply 1 application topically as needed. Patient not taking: Reported on 02/06/2017 10/05/16   [name], MD  megestrol (MEGACE) 400 MG/10ML suspension Take 10 mLs (400 mg total) by mouth 2 (two) times daily. Patient not taking: Reported on 02/06/2017 12/23/16   [name], MD    naproxen (NAPROSYN) 500 MG tablet Take three times a day with food as needed Patient not taking: Reported on 02/06/2017 12/03/16   Chauncey Cruel, MD  prochlorperazine (COMPAZINE) 10 MG tablet Take 1 tablet (10 mg total) by mouth every 6 (six) hours as needed for nausea or vomiting. Patient not taking: Reported on 02/06/2017 10/21/16   [name], MD  temazepam (RESTORIL) 15 MG capsule Take 1 capsule (15 mg total) by mouth at bedtime as needed for sleep. Patient not taking: Reported on 02/06/2017 12/02/16   [name], MD  traMADol Veatrice Bourbon) 50 MG tablet One or two tablets four times a day as needed Patient not taking: Reported on 02/06/2017 12/03/16   Chauncey Cruel, MD  Vitamin D, Ergocalciferol, (DRISDOL) 50000 units CAPS capsule Take 1 capsule (50,000 Units total) by mouth every other day. Patient not taking: Reported on 02/06/2017 03/16/16   Thurnell Lose, MD    Family History Family History  Problem Relation Age of Onset  . Hypertension Mother   . Other Mother     died of old age  . Diabetes Father     died of diabetic infection complications  . Cancer Sister     lung  . Alcohol abuse Brother   . Obesity Sister   . Hypertension Brother     Social History Social History  Substance Use Topics  . Smoking status: Former Smoker    Packs/day: 1.00    Years: 18.00    Types: Cigarettes  . Smokeless tobacco: Never Used  . Alcohol use No     Allergies   Losartan potassium-hctz   Review of Systems Review of Systems  Constitutional: Positive for fatigue. Negative for chills and fever.  Respiratory: Negative for shortness of breath.   Cardiovascular: Negative for chest pain.  Gastrointestinal: Negative for abdominal pain, constipation, diarrhea, nausea and vomiting.  Genitourinary: Negative for dysuria, flank pain and frequency.  Musculoskeletal: Negative for back pain and neck pain.  Skin: Negative for rash and wound.  Neurological: Positive for weakness. Negative  for dizziness, syncope, numbness and headaches.  All other systems reviewed and are negative.    Physical Exam Updated Vital Signs BP 117/78   Pulse (!) 102   Temp 99.7 F (37.6 C) (Rectal)   Resp 16   SpO2 96%   Physical Exam  Constitutional: He is oriented to person, place, and time. He appears well-developed and well-nourished. No distress.  HENT:  Head: Normocephalic and atraumatic.  Mouth/Throat: Oropharynx is clear and moist.  Patient has swelling to the left upper eyelid. No erythema. No obvious recent trauma. Midface is stable. No intraoral trauma.  Eyes: EOM are normal. Pupils are equal, round, and reactive to light.  Neck: Normal range of motion. Neck supple.  No posterior midline cervical tenderness to palpation.  Cardiovascular: Normal rate and  regular rhythm.  Exam reveals no gallop and no friction rub.   No murmur heard. Pulmonary/Chest: Effort normal and breath sounds normal. No respiratory distress. He has no wheezes. He has no rales. He exhibits no tenderness.  Abdominal: Soft. Bowel sounds are normal. There is no tenderness. There is no rebound and no guarding.  Musculoskeletal: Normal range of motion. He exhibits no edema or tenderness.  No lower extremity swelling or asymmetry. Pelvis is stable.  Neurological: He is alert and oriented to person, place, and time.  Patient is poor historian. Appears confused but is oriented. 5/5 motor in bilateral upper extremities and right lower extremity. 4/5 motor in the left lower extremity. Sensation is grossly intact.  Skin: Skin is warm and dry. Capillary refill takes less than 2 seconds. No rash noted. No erythema.  Nursing note and vitals reviewed.    ED Treatments / Results  Labs (all labs ordered are listed, but only abnormal results are displayed) Labs Reviewed  CBC WITH DIFFERENTIAL/PLATELET - Abnormal; Notable for the following:       Result Value   WBC 13.8 (*)    RBC 2.78 (*)    Hemoglobin 8.3 (*)    HCT  24.1 (*)    RDW 17.4 (*)    Neutro Abs 11.5 (*)    All other components within normal limits  COMPREHENSIVE METABOLIC PANEL - Abnormal; Notable for the following:    Potassium 3.3 (*)    CO2 19 (*)    Albumin 3.1 (*)    ALT 9 (*)    Alkaline Phosphatase 172 (*)    All other components within normal limits  CK - Abnormal; Notable for the following:    Total CK 466 (*)    All other components within normal limits  ETHANOL  RAPID URINE DRUG SCREEN, HOSP PERFORMED  URINALYSIS, ROUTINE W REFLEX MICROSCOPIC    EKG  EKG Interpretation None       Radiology Ct Head Wo Contrast  Result Date: 02/06/2017 CLINICAL DATA:  66 year old male with history of metastatic prostate cancer presenting with fall. EXAM: CT HEAD WITHOUT CONTRAST CT CERVICAL SPINE WITHOUT CONTRAST TECHNIQUE: Multidetector CT imaging of the head and cervical spine was performed following the standard protocol without intravenous contrast. Multiplanar CT image reconstructions of the cervical spine were also generated. COMPARISON:  Head CT dated 03/11/2016 an MRI dated 03/11/2016 and bone scan dated 10/06/2016 FINDINGS: CT HEAD FINDINGS Brain: Lobulated high attenuating masses along the left frontal dural surface most consistent with metastatic disease. Subdural hemorrhage is less likely but not entirely excluded. Overall there has been interval increase in the size of the dural based masses compared to the prior CT and MRI. These measure up to 11 mm in thickness. There is associated mass effect and compression of the adjacent brain parenchyma with effacement of the sulci and associated mass effect on the left lateral ventricle. There is approximately 8 mm left-to-right midline shift which is increased since the prior CT. Moderate periventricular and deep white matter hypodensities consistent with chronic microvascular ischemic changes. An area of white matter hypodensity in the left frontal lobe likely combination of chronic  microvascular ischemic changes and vasogenic edema. Vascular: No hyperdense vessel or unexpected calcification. Skull: Partially calcified or expansile skullbase masses involving the left frontal bone as well as left temporal bone have increased in size compared to the prior CT. A lobulated component of the mass in the left frontal cortex measures 3.2 x 2.1 cm. Sinuses/Orbits: There is  opacification of the left frontal sinus as well as opacification of multiple ethmoid air cells. The remainder of the visualized paranasal sinuses and mastoid air cells are clear. Other: Left frontal and temporal extra calvarial soft tissue thickening may represent hematoma. Extension of metastatic tumor is not excluded. CT CERVICAL SPINE FINDINGS Alignment: No acute subluxation. There is reversal of normal cervical lordosis which may be positional or due to muscle spasm or secondary to degenerative changes. Skull base and vertebrae: There is no acute fracture. There is expansile osseous metastatic disease involving the lower cervical and visualized upper thoracic spine and ribs with sclerotic changes. Soft tissues and spinal canal: No prevertebral fluid or swelling. No visible canal hematoma. Disc levels: Multilevel degenerative changes with disc space narrowing and endplate irregularity most prominent at C7-T1 Upper chest: A partially visualized 2.2 x 3.2 cm soft tissue nodular density in the right supraclavicular region likely represents an enlarged lymph node. Chest CT may provide better evaluation. A right sided Port-A-Cath is partially visualized. Other: None IMPRESSION: 1. Lobulated high attenuating dural-based masses along the left frontal convexity with interval increase in size since the prior studies most consistent with progression of metastatic disease. Superimposed subdural hemorrhage is not entirely excluded. There is associated mass effect with approximately 8 mm left-to-right midline shift, increased since the prior  CTs. Close follow-up with CT or further evaluation with MRI without and with contrast is recommended. 2. Calvarial expansile metastatic disease involving the left frontal and temporal lobes with increase in the size of the osseous metastatic disease since the prior CT. 3. No acute/traumatic cervical spine pathology. 4. Osseous sclerotic and expansile metastatic disease involving the lower cervical and visualized upper thoracic spine and ribs. 5. Partially visualized ovoid soft tissue lesion in the right supraclavicular region is concerning for metastatic disease. CT of the chest may provide better characterisation if clinically indicated. These results were called by telephone at the time of interpretation on 02/06/2017 at 9:52 pm to Dr. Julianne Rice , who verbally acknowledged these results. Electronically Signed   By: Anner Crete M.D.   On: 02/06/2017 22:03   Ct Cervical Spine Wo Contrast  Result Date: 02/06/2017 CLINICAL DATA:  66 year old male with history of metastatic prostate cancer presenting with fall. EXAM: CT HEAD WITHOUT CONTRAST CT CERVICAL SPINE WITHOUT CONTRAST TECHNIQUE: Multidetector CT imaging of the head and cervical spine was performed following the standard protocol without intravenous contrast. Multiplanar CT image reconstructions of the cervical spine were also generated. COMPARISON:  Head CT dated 03/11/2016 an MRI dated 03/11/2016 and bone scan dated 10/06/2016 FINDINGS: CT HEAD FINDINGS Brain: Lobulated high attenuating masses along the left frontal dural surface most consistent with metastatic disease. Subdural hemorrhage is less likely but not entirely excluded. Overall there has been interval increase in the size of the dural based masses compared to the prior CT and MRI. These measure up to 11 mm in thickness. There is associated mass effect and compression of the adjacent brain parenchyma with effacement of the sulci and associated mass effect on the left lateral ventricle.  There is approximately 8 mm left-to-right midline shift which is increased since the prior CT. Moderate periventricular and deep white matter hypodensities consistent with chronic microvascular ischemic changes. An area of white matter hypodensity in the left frontal lobe likely combination of chronic microvascular ischemic changes and vasogenic edema. Vascular: No hyperdense vessel or unexpected calcification. Skull: Partially calcified or expansile skullbase masses involving the left frontal bone as well as left temporal bone  have increased in size compared to the prior CT. A lobulated component of the mass in the left frontal cortex measures 3.2 x 2.1 cm. Sinuses/Orbits: There is opacification of the left frontal sinus as well as opacification of multiple ethmoid air cells. The remainder of the visualized paranasal sinuses and mastoid air cells are clear. Other: Left frontal and temporal extra calvarial soft tissue thickening may represent hematoma. Extension of metastatic tumor is not excluded. CT CERVICAL SPINE FINDINGS Alignment: No acute subluxation. There is reversal of normal cervical lordosis which may be positional or due to muscle spasm or secondary to degenerative changes. Skull base and vertebrae: There is no acute fracture. There is expansile osseous metastatic disease involving the lower cervical and visualized upper thoracic spine and ribs with sclerotic changes. Soft tissues and spinal canal: No prevertebral fluid or swelling. No visible canal hematoma. Disc levels: Multilevel degenerative changes with disc space narrowing and endplate irregularity most prominent at C7-T1 Upper chest: A partially visualized 2.2 x 3.2 cm soft tissue nodular density in the right supraclavicular region likely represents an enlarged lymph node. Chest CT may provide better evaluation. A right sided Port-A-Cath is partially visualized. Other: None IMPRESSION: 1. Lobulated high attenuating dural-based masses along the  left frontal convexity with interval increase in size since the prior studies most consistent with progression of metastatic disease. Superimposed subdural hemorrhage is not entirely excluded. There is associated mass effect with approximately 8 mm left-to-right midline shift, increased since the prior CTs. Close follow-up with CT or further evaluation with MRI without and with contrast is recommended. 2. Calvarial expansile metastatic disease involving the left frontal and temporal lobes with increase in the size of the osseous metastatic disease since the prior CT. 3. No acute/traumatic cervical spine pathology. 4. Osseous sclerotic and expansile metastatic disease involving the lower cervical and visualized upper thoracic spine and ribs. 5. Partially visualized ovoid soft tissue lesion in the right supraclavicular region is concerning for metastatic disease. CT of the chest may provide better characterisation if clinically indicated. These results were called by telephone at the time of interpretation on 02/06/2017 at 9:52 pm to Dr. Julianne Rice , who verbally acknowledged these results. Electronically Signed   By: Anner Crete M.D.   On: 02/06/2017 22:03    Procedures Procedures (including critical care time)  Medications Ordered in ED Medications  sodium chloride 0.9 % bolus 500 mL (500 mLs Intravenous New Bag/Given 02/06/17 2138)     Initial Impression / Assessment and Plan / ED Course  I have reviewed the triage vital signs and the nursing notes.  Pertinent labs & imaging results that were available during my care of the patient were reviewed by me and considered in my medical decision making (see chart for details).     Patient with known brain metastasis. CT shows worsening edema and midline shift. Cannot rule out subdural hemorrhage. Radiology recommends MRI. Discussed with Dr. Cheral Marker. Agrees that MRI is to be obtained and patient will likely need admission. Discussed with Dr. Ralene Bathe  who will accept the patient in transfer to [facility] ED given we do not have access to MRI currently. Spoke with family and they are aware of patient's situation.   Final Clinical Impressions(s) / ED Diagnoses   Final diagnoses:  Neoplasm of brain causing mass effect on adjacent structures [facility])    New Prescriptions New Prescriptions   No medications on file     Julianne Rice, MD 02/06/17 2321

## 2017-02-06 NOTE — ED Notes (Signed)
Bed: MQ28 Expected date:  Expected time:  Means of arrival:  Comments: EMS 66 yo, fall

## 2017-02-06 NOTE — ED Triage Notes (Signed)
Pt is from home where he lives alone.  Pt is a CA patient with Prostate the primary.  Son spoke to Pt yesterday at 1530 and reported to EMS that Pt was at baseline though Pts daughter thought Pt was somewhat lethargic at that same time.  Family was unable to reach Pt by phone today.  Family went to Pts home around 1700 today.  Pt would not come to the door but they could hear him.  Family found him down between his bed and the wall.   Pt denies complaints related to fall but is dimished from his baseline which is A&O x4 and walks without assistance.

## 2017-02-06 NOTE — Telephone Encounter (Signed)
Called and l/m for pt call us back.  

## 2017-02-07 ENCOUNTER — Telehealth: Payer: Self-pay

## 2017-02-07 ENCOUNTER — Encounter: Payer: Self-pay | Admitting: *Deleted

## 2017-02-07 ENCOUNTER — Emergency Department (HOSPITAL_COMMUNITY): Payer: Medicare Other

## 2017-02-07 ENCOUNTER — Encounter (HOSPITAL_COMMUNITY): Payer: Self-pay | Admitting: Family Medicine

## 2017-02-07 ENCOUNTER — Telehealth: Payer: Self-pay | Admitting: Internal Medicine

## 2017-02-07 DIAGNOSIS — Z923 Personal history of irradiation: Secondary | ICD-10-CM | POA: Diagnosis not present

## 2017-02-07 DIAGNOSIS — R4182 Altered mental status, unspecified: Secondary | ICD-10-CM

## 2017-02-07 DIAGNOSIS — G935 Compression of brain: Secondary | ICD-10-CM | POA: Diagnosis present

## 2017-02-07 DIAGNOSIS — Z515 Encounter for palliative care: Secondary | ICD-10-CM | POA: Diagnosis not present

## 2017-02-07 DIAGNOSIS — W19XXXA Unspecified fall, initial encounter: Secondary | ICD-10-CM | POA: Diagnosis not present

## 2017-02-07 DIAGNOSIS — Z809 Family history of malignant neoplasm, unspecified: Secondary | ICD-10-CM | POA: Diagnosis not present

## 2017-02-07 DIAGNOSIS — C7931 Secondary malignant neoplasm of brain: Secondary | ICD-10-CM | POA: Diagnosis present

## 2017-02-07 DIAGNOSIS — I10 Essential (primary) hypertension: Secondary | ICD-10-CM | POA: Diagnosis not present

## 2017-02-07 DIAGNOSIS — Z7189 Other specified counseling: Secondary | ICD-10-CM | POA: Diagnosis not present

## 2017-02-07 DIAGNOSIS — W1830XA Fall on same level, unspecified, initial encounter: Secondary | ICD-10-CM | POA: Diagnosis not present

## 2017-02-07 DIAGNOSIS — E876 Hypokalemia: Secondary | ICD-10-CM | POA: Diagnosis present

## 2017-02-07 DIAGNOSIS — Z888 Allergy status to other drugs, medicaments and biological substances status: Secondary | ICD-10-CM | POA: Diagnosis not present

## 2017-02-07 DIAGNOSIS — G936 Cerebral edema: Secondary | ICD-10-CM | POA: Diagnosis present

## 2017-02-07 DIAGNOSIS — D496 Neoplasm of unspecified behavior of brain: Secondary | ICD-10-CM | POA: Diagnosis not present

## 2017-02-07 DIAGNOSIS — D649 Anemia, unspecified: Secondary | ICD-10-CM

## 2017-02-07 DIAGNOSIS — K219 Gastro-esophageal reflux disease without esophagitis: Secondary | ICD-10-CM | POA: Diagnosis present

## 2017-02-07 DIAGNOSIS — C7951 Secondary malignant neoplasm of bone: Secondary | ICD-10-CM | POA: Diagnosis present

## 2017-02-07 DIAGNOSIS — C61 Malignant neoplasm of prostate: Secondary | ICD-10-CM | POA: Diagnosis present

## 2017-02-07 DIAGNOSIS — Z87891 Personal history of nicotine dependence: Secondary | ICD-10-CM | POA: Diagnosis not present

## 2017-02-07 DIAGNOSIS — G9340 Encephalopathy, unspecified: Secondary | ICD-10-CM | POA: Diagnosis present

## 2017-02-07 DIAGNOSIS — D638 Anemia in other chronic diseases classified elsewhere: Secondary | ICD-10-CM | POA: Diagnosis present

## 2017-02-07 DIAGNOSIS — Y92239 Unspecified place in hospital as the place of occurrence of the external cause: Secondary | ICD-10-CM | POA: Diagnosis not present

## 2017-02-07 DIAGNOSIS — Y92009 Unspecified place in unspecified non-institutional (private) residence as the place of occurrence of the external cause: Secondary | ICD-10-CM | POA: Diagnosis not present

## 2017-02-07 DIAGNOSIS — T380X5A Adverse effect of glucocorticoids and synthetic analogues, initial encounter: Secondary | ICD-10-CM | POA: Diagnosis not present

## 2017-02-07 DIAGNOSIS — Z79899 Other long term (current) drug therapy: Secondary | ICD-10-CM | POA: Diagnosis not present

## 2017-02-07 DIAGNOSIS — Z9221 Personal history of antineoplastic chemotherapy: Secondary | ICD-10-CM | POA: Diagnosis not present

## 2017-02-07 DIAGNOSIS — Z66 Do not resuscitate: Secondary | ICD-10-CM | POA: Diagnosis not present

## 2017-02-07 DIAGNOSIS — D72829 Elevated white blood cell count, unspecified: Secondary | ICD-10-CM | POA: Diagnosis present

## 2017-02-07 DIAGNOSIS — Z8249 Family history of ischemic heart disease and other diseases of the circulatory system: Secondary | ICD-10-CM | POA: Diagnosis not present

## 2017-02-07 MED ORDER — ACETAMINOPHEN 650 MG RE SUPP
650.0000 mg | Freq: Four times a day (QID) | RECTAL | Status: DC | PRN
Start: 2017-02-07 — End: 2017-02-09

## 2017-02-07 MED ORDER — TAMSULOSIN HCL 0.4 MG PO CAPS
0.4000 mg | ORAL_CAPSULE | Freq: Every day | ORAL | Status: DC
  Administered 2017-02-07 – 2017-02-09 (×3): 0.4 mg via ORAL
  Filled 2017-02-07 (×3): qty 1

## 2017-02-07 MED ORDER — DEXAMETHASONE SODIUM PHOSPHATE 4 MG/ML IJ SOLN
4.0000 mg | INTRAMUSCULAR | Status: DC
  Administered 2017-02-07 – 2017-02-09 (×11): 4 mg via INTRAVENOUS
  Filled 2017-02-07 (×12): qty 1

## 2017-02-07 MED ORDER — ORAL CARE MOUTH RINSE
15.0000 mL | Freq: Two times a day (BID) | OROMUCOSAL | Status: DC
  Administered 2017-02-08 (×2): 15 mL via OROMUCOSAL

## 2017-02-07 MED ORDER — CALCIUM CARBONATE-VITAMIN D 500-200 MG-UNIT PO TABS
1.0000 | ORAL_TABLET | Freq: Two times a day (BID) | ORAL | Status: DC
  Administered 2017-02-07 – 2017-02-09 (×5): 1 via ORAL
  Filled 2017-02-07 (×5): qty 1

## 2017-02-07 MED ORDER — OXYCODONE HCL 5 MG PO TABS
5.0000 mg | ORAL_TABLET | ORAL | Status: DC | PRN
  Administered 2017-02-09: 5 mg via ORAL
  Filled 2017-02-07: qty 1

## 2017-02-07 MED ORDER — ENOXAPARIN SODIUM 40 MG/0.4ML ~~LOC~~ SOLN
40.0000 mg | SUBCUTANEOUS | Status: DC
  Administered 2017-02-07 – 2017-02-08 (×2): 40 mg via SUBCUTANEOUS
  Filled 2017-02-07 (×3): qty 0.4

## 2017-02-07 MED ORDER — CALCITRIOL 0.5 MCG PO CAPS
0.5000 ug | ORAL_CAPSULE | Freq: Two times a day (BID) | ORAL | Status: DC
  Administered 2017-02-07 – 2017-02-09 (×5): 0.5 ug via ORAL
  Filled 2017-02-07 (×5): qty 1

## 2017-02-07 MED ORDER — ONDANSETRON HCL 4 MG/2ML IJ SOLN: 4.0000 mg | Freq: Four times a day (QID) | INTRAMUSCULAR | Status: DC | PRN

## 2017-02-07 MED ORDER — CHLORHEXIDINE GLUCONATE 0.12 % MT SOLN
15.0000 mL | Freq: Two times a day (BID) | OROMUCOSAL | Status: DC
  Administered 2017-02-07 – 2017-02-09 (×4): 15 mL via OROMUCOSAL
  Filled 2017-02-07 (×4): qty 15

## 2017-02-07 MED ORDER — POLYVINYL ALCOHOL 1.4 % OP SOLN
1.0000 [drp] | Freq: Four times a day (QID) | OPHTHALMIC | Status: DC
Start: 2017-02-07 — End: 2017-02-09
  Administered 2017-02-07 – 2017-02-08 (×6): 1 [drp] via OPHTHALMIC
  Filled 2017-02-07: qty 15

## 2017-02-07 MED ORDER — ACETAMINOPHEN 325 MG PO TABS: 650.0000 mg | ORAL_TABLET | Freq: Four times a day (QID) | ORAL | Status: DC | PRN

## 2017-02-07 MED ORDER — DEXAMETHASONE SODIUM PHOSPHATE 10 MG/ML IJ SOLN
10.0000 mg | Freq: Once | INTRAMUSCULAR | Status: AC
  Administered 2017-02-07: 10 mg via INTRAVENOUS
  Filled 2017-02-07: qty 1

## 2017-02-07 MED ORDER — POTASSIUM CHLORIDE CRYS ER 20 MEQ PO TBCR
20.0000 meq | EXTENDED_RELEASE_TABLET | Freq: Two times a day (BID) | ORAL | Status: DC
  Administered 2017-02-07 – 2017-02-09 (×5): 20 meq via ORAL
  Filled 2017-02-07 (×5): qty 1

## 2017-02-07 MED ORDER — ONDANSETRON HCL 4 MG PO TABS: 4.0000 mg | ORAL_TABLET | Freq: Four times a day (QID) | ORAL | Status: DC | PRN

## 2017-02-07 MED ORDER — CALCIUM CARB-CHOLECALCIFEROL 500-125 MG-UNIT PO TABS: 1.0000 | ORAL_TABLET | Freq: Two times a day (BID) | ORAL | Status: DC

## 2017-02-07 MED ORDER — AMLODIPINE BESYLATE 10 MG PO TABS
10.0000 mg | ORAL_TABLET | Freq: Every day | ORAL | Status: DC
  Administered 2017-02-07 – 2017-02-09 (×3): 10 mg via ORAL
  Filled 2017-02-07 (×3): qty 1

## 2017-02-07 MED ORDER — FINASTERIDE 5 MG PO TABS
5.0000 mg | ORAL_TABLET | Freq: Every day | ORAL | Status: DC
  Administered 2017-02-07 – 2017-02-09 (×3): 5 mg via ORAL
  Filled 2017-02-07 (×3): qty 1

## 2017-02-07 MED ORDER — PANTOPRAZOLE SODIUM 40 MG PO TBEC
80.0000 mg | DELAYED_RELEASE_TABLET | Freq: Every day | ORAL | Status: DC
  Administered 2017-02-07 – 2017-02-09 (×3): 80 mg via ORAL
  Filled 2017-02-07 (×3): qty 2

## 2017-02-07 MED ORDER — GADOBENATE DIMEGLUMINE 529 MG/ML IV SOLN
15.0000 mL | Freq: Once | INTRAVENOUS | Status: AC | PRN
  Administered 2017-02-07: 15 mL via INTRAVENOUS

## 2017-02-07 MED ORDER — ENSURE ENLIVE PO LIQD
237.0000 mL | Freq: Two times a day (BID) | ORAL | Status: DC
  Administered 2017-02-07 – 2017-02-08 (×2): 237 mL via ORAL
  Administered 2017-02-08: 50 mL via ORAL
  Filled 2017-02-07: qty 237

## 2017-02-07 NOTE — Telephone Encounter (Signed)
Pt is in the hospital.

## 2017-02-07 NOTE — Progress Notes (Addendum)
  PROGRESS NOTE  Ark Agrusa  EEF:[id number] DOB: 24-Jun-1951 DOA: 02/06/2017 PCP: [name], PA-C   Brief Narrative: Steven Church Sr. is a 66 y.o. male with a past medical history significant for HTN and metastatic prostate cancer who presented with increased confusion from home after being found between the bed and the wall. On arrival he was afebrile in no distress. CK was elevated at 466, UDS +benzodiazepines. CT of the head showed interval progression of dural metastases and calavarial mets and worsening midline shift, possible SDH. Urgent MRI was recommended, for which the patient was transferred to [facility]. This demonstrated no SDH but vasogenic edema around worsening metastasis. He was transferred back to [facility] for admission. Oncology and palliative care were consulted. Dr. Burr Medico is to discuss with radiation oncology.   Patient was initially diagnosed in 2016 with prostate cancer, metastatic at the time of diagnosis.  He had brain mets noted early 2017 and received whole brain radiation therapy in Apr 2017.  Had treatment failure with androgen deprivation and so since January 2018 has been on Taxotere, which is currently being continued because the patient seemed to be having improved functional status on therapy.  Assessment & Plan: Principal Problem:   Neoplasm of brain causing mass effect on adjacent structures [facility]) Active Problems:   Essential hypertension   Prostate cancer ([place])   Brain metastasis ([place])   Chronic anemia  Fall, acute encephalopathy due to cerebral edema related to brain/skull metastasis: H/o irradiation. Up to 53mm midline shift was noted as long ago as last June CT head, and so presumably his current mental status change is more from edema.  - I've discussed with neurosurgery, Dr. [name], who believes the mass is resectable, and will discuss options with pt and family later today. Craniectomy, etc. would be contingent on overall goals of care.  -  Oncology evaluation pending. They are to discuss with radiation oncology. - Continue IV decadron  - Fall precautions - CK elevation noted, will continue Isotonic fluids, no evidence of overload. Recheck in AM.  Pancreatic cancer metastatic to brain: Poor prognosis.  - Oncology, palliative care consulted as above. - As above  HTN: Chronic, stable.  - Continue norvasc  Hypokalemia: Mild, repleted at admission.  - Monitor  Subjective: Pt without pain, currently sleeping.   Objective: BP 100/72 (BP Location: Left Arm)   Pulse 98   Temp 98.7 F (37.1 C) (Oral)   Resp 18   Ht 5\' 10"  (1.778 m)   Wt 62.3 kg (137 lb 5.6 oz)   SpO2 100%   BMI 19.71 kg/m   Gen: 66yo M sleeping in no distress CV: RRR, no murmur, JVD or edema Pulm: Nonlabored, clear Neuro: Appropriately interactive, oriented to person and place, not time. Situational awareness is fair, noting he's here for a fall. Gait not assessed.  Vance Gather, MD [facility] Pager [phone]  If 7PM-7AM, please contact night-coverage [email or web address] Password [email or web address] 02/07/2017, 11:48 AM

## 2017-02-07 NOTE — ED Notes (Signed)
Patient transported to MRI 

## 2017-02-07 NOTE — Telephone Encounter (Signed)
Camelia Eng Tysinger, PA-C  You 14 hours ago (8:08 PM)      Pls add her cell number to this phone message so I see it or show me where her number is      Documentation     You  Carlena Hurl, PA-C 19 hours ago (2:46 PM)      Sister called pt said very concern about him  , he is having memory loss, not eating like he should. Forget to take his meds. Having a lot of confusion. He lives alone. His sister wants to be called when he come in to talk to you at the visit.      Documentation     You 20 hours ago (2:14 PM)      Called and l/m for pt call us back.      Documentation     You 4 days ago      Called and l/m for his sister to call us back.      Documentation     Camelia Eng Tysinger, PA-C  You 4 days ago      I don't see number for sister, but call sister to see if she thinks he needs to be in assisted living facility or nursing facility?  Does she think he is overall unsafe to be alone?  Has hospice been mentioned or discussed?  I know this is difficult to discuss, but I wanted to get some family input       Documentation     Alean Rinne routed conversation to [employer], PA-C 4 days ago    Alean Rinne 4 days ago      Pt's sister, [name], called to let Audelia Acton know her concerns about pt before his appointment on 02/08/17 because she will not be able to come with him to the appointment. Pt's health is diminishing. He is losing weight, has memory loss, eye is swollen again. He lives alone but sister goes over to help get meds organized in pill box by day but this week he still took wrong days pills out and take them and/or may not take all of pills or something. Get appointment info mixed up and confused.Sister is worried about pt driving and being home to care for himself. Not sure that he is eating properly or at all. Pt's son should be coming with him to him appointment on 02/08/17.      Documentation     [place], [place] [phone]  Alean Rinne

## 2017-02-07 NOTE — Progress Notes (Signed)
[place] Work  Clinical Social Work met with patient briefly at bedside and contacted patient's sister, [name], and son, Ronalee Belts.  CSW familiar with patient from [facility] and will continue to provide emotional support to patient/family throughout cancer journey.    Maryjean Morn, MSW, LCSW, OSW-C Clinical Social Worker [facility] [phone]

## 2017-02-07 NOTE — Telephone Encounter (Signed)
Pt's sister called to let you know that patient is in the hospital as he fell yesterday.

## 2017-02-07 NOTE — H&P (Signed)
History and Physical  Patient Name: Steven Church     QMG:[id number]    DOB: 11/01/1950    DOA: 02/06/2017 PCP: [name], PA-C   Patient coming from: Home --> [facility] --> [facility]  Chief Complaint: Fall  HPI: Steven Church. is a 66 y.o. male with a past medical history significant for HTN and metastatic prostate Cancer who presents with fall.  Caveat that patient is currently altered and so all history is collected from family via [place].  Patient lives alone.  Wilburn Mylar, family talked to him on the phone around 1530, then today could not reach him by phone.  Went to the house at 1700 and found him wedged between his bed and the wall.  Patient denied complaints, seemed altered to family.  Was transported by EMS.  ED course: -Afebrile, heart rate 104, respirations normal, BP 113/74, pulse ox normal -Na 139, K 3.3, Cr 0.74, WBC 13.8K, Hgb 8.3 and normocytic and near baseline -CK 466 -UDS showed only benzodiazepines -Alcohol negative -UA showed only ketones -CT of the head showed interval progression of dural metastases and calavarial mets, worsening midline shift, possible SDH, recommended MRI -Transferred to [facility] for urgent MRI which showed no SDH but vasogenic edema around his worsening metastasis and so TRH were asked to facilitate admission     Patient was initially diagnosed in 2016 with prostate cancer, metastatic at the time of diagnosis.  He had brain mets noted early 2017 and received whole brain radiation therapy in Apr 2017.  Had treatment failure with androgen deprivation and so since January 2018 has been on Taxotere, which is currently being continued because the patient seemed to be having improved functional status on therapy.    ROS: Review of Systems  Unable to perform ROS: Mental status change          Past Medical History:  Diagnosis Date  . Bone cancer ([place])   . Brain cancer ([place])   . Family history of cancer   . Former smoker   . GERD  (gastroesophageal reflux disease)   . Hypertension   . Obesity   . Prostate cancer ([place]) 04/07/2015   bone mets, started oncology 03/2015    Past Surgical History:  Procedure Laterality Date  . COLONOSCOPY     never  . IR GENERIC HISTORICAL  10/19/2016   IR FLUORO GUIDE PORT INSERTION RIGHT 10/19/2016 Markus Daft, MD [facility]-INTERV RAD  . IR GENERIC HISTORICAL  10/19/2016   IR US GUIDE VASC ACCESS RIGHT 10/19/2016 Markus Daft, MD [facility]-INTER[facility]    Social History: Patient lives alone reportedly.  The patient walks uanssisted.  Former smoker.    Allergies  Allergen Reactions  . Losartan Potassium-Hctz Other (See Comments)    dizziness    Family history: family history includes Alcohol abuse in his brother; Cancer in his sister; Diabetes in his father; Hypertension in his brother and mother; Obesity in his sister; Other in his mother.  Prior to Admission medications   Medication Sig Start Date End Date Taking? Authorizing Provider  amLODipine (NORVASC) 10 MG tablet TAKE 1 TABLET BY MOUTH EVERY DAY 02/02/17  Yes Camelia Eng Tysinger, PA-C  calcitRIOL (ROCALTROL) 0.5 MCG capsule TAKE 1 CAPSULE TWICE A DAY Patient taking differently: TAKE 1 CAPSULE PO TWICE A DAY 11/04/16  Yes Camelia Eng Tysinger, PA-C  [facility] SHELL CALCIUM+VIT D 500-125 MG-UNIT TABS TAKE 1 TABLET BY MOUTH TWICE A DAY 12/01/16  Yes Camelia Eng Tysinger, PA-C  finasteride (  PROSCAR) 5 MG tablet TAKE 1 TABLET BY MOUTH EVERY DAY 01/11/17  Yes Camelia Eng Tysinger, PA-C  omeprazole (PRILOSEC) 40 MG capsule TAKE 1 CAPSULE (40 MG TOTAL) BY MOUTH DAILY. 01/30/17  Yes Camelia Eng Tysinger, PA-C  tamsulosin (FLOMAX) 0.4 MG CAPS capsule TAKE ONE CAPSULE BY MOUTH EVERY DAY 01/23/17  Yes Camelia Eng Tysinger, PA-C  KLOR-CON M20 20 MEQ tablet Take 1 tablet (20 mEq total) by mouth 2 (two) times daily. 02/06/17   [name], MD       Physical Exam: BP 126/89   Pulse (!) 116   Temp 98.5 F (36.9 C) (Oral)   Resp 20   SpO2 100%  General appearance: Thin adult male,  awake, rousable but sluggish.  In no obvious distress.   Head: There is swelling over the left forehead and periorbitally.  Not fluctuant. Eyes: Anicteric, conjunctiva pink, lids and lashes normal.  Left eye closed, does not open it, some swelling around it. ENT: No nasal deformity, discharge, epistaxis.  Hearing seems normal. OP moist without lesions.   Neck: No neck masses.  Trachea midline.  No thyromegaly/tenderness. Lymph: No cervical or supraclavicular lymphadenopathy. Skin: Warm and dry.  No suspicious rashes or lesions. Cardiac: RRR, nl S1-S2, no murmurs appreciated.  Capillary refill is brisk.  Respiratory: Normal respiratory rate and rhythm.  CTAB without rales or wheezes. Abdomen: Abdomen soft.  No TTP.  Scaphoid. No ascites, distension, hepatosplenomegaly.   MSK: No deformities or effusions.  No cyanosis or clubbing.  DIffuse loss of muscle mass. Neuro: Strength seems 5/5 and symmetric.  Gait not tested, but leg strength seems preserved.      Psych: Not able to tell me where he is or how he got here.  Mostly does not answer questions at this time.     Labs on Admission:  I have personally reviewed following labs and imaging studies: CBC:  Recent Labs Lab 02/06/17 2130  WBC 13.8*  NEUTROABS 11.5*  HGB 8.3*  HCT 24.1*  MCV 86.7  PLT 818   Basic Metabolic Panel:  Recent Labs Lab 02/06/17 2130  NA 139  K 3.3*  CL 109  CO2 19*  GLUCOSE 90  BUN 9  CREATININE 0.74  CALCIUM 9.0   GFR: CrCl cannot be calculated (Unknown ideal weight.).  Liver Function Tests:  Recent Labs Lab 02/06/17 2130  AST 30  ALT 9*  ALKPHOS 172*  BILITOT 1.2  PROT 7.0  ALBUMIN 3.1*   No results for input(s): LIPASE, AMYLASE in the last 168 hours. No results for input(s): AMMONIA in the last 168 hours. Coagulation Profile: No results for input(s): INR, PROTIME in the last 168 hours. Cardiac Enzymes:  Recent Labs Lab 02/06/17 2130  CKTOTAL 466*   BNP (last 3 results) No  results for input(s): PROBNP in the last 8760 hours. HbA1C: No results for input(s): HGBA1C in the last 72 hours. CBG: No results for input(s): GLUCAP in the last 168 hours. Lipid Profile: No results for input(s): CHOL, HDL, LDLCALC, TRIG, CHOLHDL, LDLDIRECT in the last 72 hours. Thyroid Function Tests: No results for input(s): TSH, T4TOTAL, FREET4, T3FREE, THYROIDAB in the last 72 hours. Anemia Panel: No results for input(s): VITAMINB12, FOLATE, FERRITIN, TIBC, IRON, RETICCTPCT in the last 72 hours. Sepsis Labs:  Invalid input(s): PROCALCITONIN, LACTICIDVEN No results found for this or any previous visit (from the past 240 hour(s)).       Radiological Exams on Admission: Personally reviewed CT head report and MR report: Ct  Head Wo Contrast  Result Date: 02/06/2017 CLINICAL DATA:  66 year old male with history of metastatic prostate cancer presenting with fall. EXAM: CT HEAD WITHOUT CONTRAST CT CERVICAL SPINE WITHOUT CONTRAST TECHNIQUE: Multidetector CT imaging of the head and cervical spine was performed following the standard protocol without intravenous contrast. Multiplanar CT image reconstructions of the cervical spine were also generated. COMPARISON:  Head CT dated 03/11/2016 an MRI dated 03/11/2016 and bone scan dated 10/06/2016 FINDINGS: CT HEAD FINDINGS Brain: Lobulated high attenuating masses along the left frontal dural surface most consistent with metastatic disease. Subdural hemorrhage is less likely but not entirely excluded. Overall there has been interval increase in the size of the dural based masses compared to the prior CT and MRI. These measure up to 11 mm in thickness. There is associated mass effect and compression of the adjacent brain parenchyma with effacement of the sulci and associated mass effect on the left lateral ventricle. There is approximately 8 mm left-to-right midline shift which is increased since the prior CT. Moderate periventricular and deep white matter  hypodensities consistent with chronic microvascular ischemic changes. An area of white matter hypodensity in the left frontal lobe likely combination of chronic microvascular ischemic changes and vasogenic edema. Vascular: No hyperdense vessel or unexpected calcification. Skull: Partially calcified or expansile skullbase masses involving the left frontal bone as well as left temporal bone have increased in size compared to the prior CT. A lobulated component of the mass in the left frontal cortex measures 3.2 x 2.1 cm. Sinuses/Orbits: There is opacification of the left frontal sinus as well as opacification of multiple ethmoid air cells. The remainder of the visualized paranasal sinuses and mastoid air cells are clear. Other: Left frontal and temporal extra calvarial soft tissue thickening may represent hematoma. Extension of metastatic tumor is not excluded. CT CERVICAL SPINE FINDINGS Alignment: No acute subluxation. There is reversal of normal cervical lordosis which may be positional or due to muscle spasm or secondary to degenerative changes. Skull base and vertebrae: There is no acute fracture. There is expansile osseous metastatic disease involving the lower cervical and visualized upper thoracic spine and ribs with sclerotic changes. Soft tissues and spinal canal: No prevertebral fluid or swelling. No visible canal hematoma. Disc levels: Multilevel degenerative changes with disc space narrowing and endplate irregularity most prominent at C7-T1 Upper chest: A partially visualized 2.2 x 3.2 cm soft tissue nodular density in the right supraclavicular region likely represents an enlarged lymph node. Chest CT may provide better evaluation. A right sided Port-A-Cath is partially visualized. Other: None IMPRESSION: 1. Lobulated high attenuating dural-based masses along the left frontal convexity with interval increase in size since the prior studies most consistent with progression of metastatic disease.  Superimposed subdural hemorrhage is not entirely excluded. There is associated mass effect with approximately 8 mm left-to-right midline shift, increased since the prior CTs. Close follow-up with CT or further evaluation with MRI without and with contrast is recommended. 2. Calvarial expansile metastatic disease involving the left frontal and temporal lobes with increase in the size of the osseous metastatic disease since the prior CT. 3. No acute/traumatic cervical spine pathology. 4. Osseous sclerotic and expansile metastatic disease involving the lower cervical and visualized upper thoracic spine and ribs. 5. Partially visualized ovoid soft tissue lesion in the right supraclavicular region is concerning for metastatic disease. CT of the chest may provide better characterisation if clinically indicated. These results were called by telephone at the time of interpretation on 02/06/2017 at 9:52 pm  to Dr. Julianne Rice , who verbally acknowledged these results. Electronically Signed   By: Anner Crete M.D.   On: 02/06/2017 22:03   Ct Cervical Spine Wo Contrast  Result Date: 02/06/2017 CLINICAL DATA:  66 year old male with history of metastatic prostate cancer presenting with fall. EXAM: CT HEAD WITHOUT CONTRAST CT CERVICAL SPINE WITHOUT CONTRAST TECHNIQUE: Multidetector CT imaging of the head and cervical spine was performed following the standard protocol without intravenous contrast. Multiplanar CT image reconstructions of the cervical spine were also generated. COMPARISON:  Head CT dated 03/11/2016 an MRI dated 03/11/2016 and bone scan dated 10/06/2016 FINDINGS: CT HEAD FINDINGS Brain: Lobulated high attenuating masses along the left frontal dural surface most consistent with metastatic disease. Subdural hemorrhage is less likely but not entirely excluded. Overall there has been interval increase in the size of the dural based masses compared to the prior CT and MRI. These measure up to 11 mm in  thickness. There is associated mass effect and compression of the adjacent brain parenchyma with effacement of the sulci and associated mass effect on the left lateral ventricle. There is approximately 8 mm left-to-right midline shift which is increased since the prior CT. Moderate periventricular and deep white matter hypodensities consistent with chronic microvascular ischemic changes. An area of white matter hypodensity in the left frontal lobe likely combination of chronic microvascular ischemic changes and vasogenic edema. Vascular: No hyperdense vessel or unexpected calcification. Skull: Partially calcified or expansile skullbase masses involving the left frontal bone as well as left temporal bone have increased in size compared to the prior CT. A lobulated component of the mass in the left frontal cortex measures 3.2 x 2.1 cm. Sinuses/Orbits: There is opacification of the left frontal sinus as well as opacification of multiple ethmoid air cells. The remainder of the visualized paranasal sinuses and mastoid air cells are clear. Other: Left frontal and temporal extra calvarial soft tissue thickening may represent hematoma. Extension of metastatic tumor is not excluded. CT CERVICAL SPINE FINDINGS Alignment: No acute subluxation. There is reversal of normal cervical lordosis which may be positional or due to muscle spasm or secondary to degenerative changes. Skull base and vertebrae: There is no acute fracture. There is expansile osseous metastatic disease involving the lower cervical and visualized upper thoracic spine and ribs with sclerotic changes. Soft tissues and spinal canal: No prevertebral fluid or swelling. No visible canal hematoma. Disc levels: Multilevel degenerative changes with disc space narrowing and endplate irregularity most prominent at C7-T1 Upper chest: A partially visualized 2.2 x 3.2 cm soft tissue nodular density in the right supraclavicular region likely represents an enlarged lymph  node. Chest CT may provide better evaluation. A right sided Port-A-Cath is partially visualized. Other: None IMPRESSION: 1. Lobulated high attenuating dural-based masses along the left frontal convexity with interval increase in size since the prior studies most consistent with progression of metastatic disease. Superimposed subdural hemorrhage is not entirely excluded. There is associated mass effect with approximately 8 mm left-to-right midline shift, increased since the prior CTs. Close follow-up with CT or further evaluation with MRI without and with contrast is recommended. 2. Calvarial expansile metastatic disease involving the left frontal and temporal lobes with increase in the size of the osseous metastatic disease since the prior CT. 3. No acute/traumatic cervical spine pathology. 4. Osseous sclerotic and expansile metastatic disease involving the lower cervical and visualized upper thoracic spine and ribs. 5. Partially visualized ovoid soft tissue lesion in the right supraclavicular region is concerning for metastatic  disease. CT of the chest may provide better characterisation if clinically indicated. These results were called by telephone at the time of interpretation on 02/06/2017 at 9:52 pm to Dr. Julianne Rice , who verbally acknowledged these results. Electronically Signed   By: Anner Crete M.D.   On: 02/06/2017 22:03   Mr Jeri Cos And Wo Contrast  Result Date: 02/07/2017 CLINICAL DATA:  Intracranial metastatic disease and possible intracranial hemorrhage. EXAM: MRI HEAD WITHOUT AND WITH CONTRAST TECHNIQUE: Multiplanar, multiecho pulse sequences of the brain and surrounding structures were obtained without and with intravenous contrast. CONTRAST:  33mL MULTIHANCE GADOBENATE DIMEGLUMINE 529 MG/ML IV SOLN COMPARISON:  Head CT 02/06/2017 Brain MRI 03/11/2016 FINDINGS: Brain: There is a large, predominantly calcified extra-axial mass the left frontal convexity that exerts mass effect on the  underlying brain and causes vasogenic edema pain and rightward midline shift of 7 mm. The mass expands part of the left frontoparietal calvarium and extends through the skull to include a large extracranial component. Altogether, the mass measures approximately 7.4 x 2.7 cm cyst, though its irregular shape limits the accuracy of the measurement. There is dural thickening and contrast enhancement throughout the left hemisphere. A small focus of high DWI signal underlies the mass without diffusion restriction on the ADC map. There is no discrete subdural hematoma. The hyperdense material on the CT corresponds to a portion of the solid tumor. There is bilateral periventricular white matter hyperintensity, consistent with chronic microvascular ischemia. Vascular: Major intracranial arterial and venous sinus flow voids are preserved. No evidence of chronic microhemorrhage or amyloid angiopathy. Skull and upper cervical spine: Left frontoparietal calvarial centered lesion, as above. No other discrete osseous lesions are identified. Sinuses/Orbits: Left frontal mucosal thickening. Normal orbits. IMPRESSION: 1. Large metastatic lesion centered in the left frontoparietal calvarium with large intracranial dural and extracranial soft tissue components with reactive thickening of much of the left hemispheric dura. 2. Vasogenic edema within the brain adjacent to the above-described lesion with 7 mm of rightward midline shift and early subfalcine herniation. 3. No subdural hematoma or acute ischemia. Electronically Signed   By: Ulyses Jarred M.D.   On: 02/07/2017 03:35    EKG: Independently reviewed. Rate 96, QTc 463, sinus rhythm.         Assessment/Plan  1. Fall, altered mental status in setting of worsening prostate cancer metastatic to brain:  Up to 74mm midline shift was noted as long ago as last June CT head, and so presumably his current mental status change is more from edema.  Overall prognosis very poor.   No role for surgery.  Will admit to [facility] for steroids, eval by Onc and Rad Onc.   -Dexamethasone 4 mg q4hrs -Consult to Oncology, appreciate cares -Consult to Palliative Care, appreciate cares -Consult to Radiation oncology   2. Hypertension:  -Continue amlodipine  3. Hypokalemia: -Start oral K supplement  4. Other medications:  -Continue caclium and calcitriol -Continue Proscar and Flomax -Continue PPI           DVT prophylaxis: Lovenox  Code Status: DO NOT RESUSCITATE, per POA form 11/22/16 in Media tab Family Communication: Attempted to reach son, no answer  Disposition Plan: Anticipate IV decadron and Onc/Rad Onc consults.  Palliative care consultation. Consults called: Oncology, Dr. Burr Medico Admission status: INPATIENT        Medical decision making: Patient seen at 4:45 AM on 02/07/2017.  The patient was discussed with Dr. Venora Maples and Dr. Burr Medico.  What exists of the patient's chart was  reviewed in depth and summarized above.  Clinical condition: stable.        Edwin Dada [facility] Pager [phone]

## 2017-02-07 NOTE — Progress Notes (Signed)
Events in the last 24 hours noted. Patient hospitalized after the altered mental status and a fall. MRI of the brain showed a large metastatic lesion in the frontoparietal calvarium with vasogenic edema and a midline shift. He was started on dexamethasone with improvement in his symptoms.  Clinically, he is comfortable without any headaches or pain. He is overall debilitated and rather weak. His most recent PSA was elevated at 716 despite being on chemotherapy.  His MRI was reviewed with Dr. Tammi Klippel and felt that no further radiation therapy is recommended at this time.  Given the rise in his PSA and progression of disease, I have recommended to stop all anticancer treatment. I have recommended proceeding with hospice care only and possible placement at the residential hospice.  I agree with palliative medicine evaluation for goals of care and assisting in his placement.

## 2017-02-07 NOTE — Consult Note (Signed)
Consultation Note Date: 02/07/2017   Patient Name: Steven Church  DOB: 30-Mar-1951  MRN: [id number]  Age / Sex: 66 y.o., male  PCP: Steven Church Referring Physician: Patrecia Pour, MD  Reason for Consultation: Establishing goals of care  HPI/Patient Profile: 66 y.o. 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 Shift to the right has only increased slightly from 5.6 mm  To 7 mm. PSA has increased to 716 01/13/17 from 427 12/23/16. Palliative consulted to assist with GOC/EOL discussion with patient and family d/t worsening metastatic disease despite treatment.   Clinical Assessment and Goals of Care: I met today at Mr. Steven Church bedside. No family at bedside. He is lying in bed, comfortable and only complaint in weakness ("I need to work more with my walker" - he repeats this often in conversation) as well as poor intake/appetite. Left eye edema and droop is not new per notes. He knows that he is in the hospital because he fell, does not remember falling, unclear how much he understands about his current health condition as he is confused. Unclear if current confusion is worse than recent noted confusion by family.   Per notes,Dr. Alen Church does not recommend further treatment for his cancer d/t progression and functional decline and recommends hospice care. I do not believe this has been discussed with family yet today.   I have left voicemail for Son Steven Church. and spoke with sister Steven Church to meet together to discuss current MRI findings and a plan to care for him forward. Steven Church would like to meet at 7:30 am 02/08/17 and will reach out to Steven Church to see if this  works for him. Will await confirmation from family for meeting time tomorrow.   Primary Decision Maker NEXT OF KIN son Steven Church    SUMMARY OF RECOMMENDATIONS   - Scheduling family meeting to help support patient in making decisions - Will plan to discuss code status, GOC, and hospice options - I fear he may not quite meet hospice facility requirement at this time and may only leave SNF rehab (SNF hospice is not covered by insurance unless they have Medicare and Medicaid)   Code Status/Advance Care Planning:  Full code   Symptom Management:   Weakness: Consider PT  Poor intake: Ensure BID ordered  Palliative Prophylaxis:   Bowel Regimen, Delirium Protocol, Frequent Pain Assessment, Oral Care and Turn Reposition   Psycho-social/Spiritual:   Desire for further Chaplaincy support:no  Additional Recommendations: Caregiving  Support/Resources and Education on Hospice  Prognosis:   Likely weeks to months - unable to determine at this time. May possibly qualify for hospice facility.   Discharge Planning: To Be Determined      Primary Diagnoses: Present on Admission: . Brain metastasis (Steven Church) . Essential hypertension . Prostate cancer (Steven Church) .  Chronic anemia . Neoplasm of brain causing mass effect on adjacent structures (Steven Church)   I have reviewed the medical record, interviewed the patient and family, and examined the patient. The following aspects are pertinent.  Past Medical History:  Diagnosis Date  . Bone cancer (Steven Church)   . Brain cancer (Steven Church)   . Family history of cancer   . Former smoker   . GERD (gastroesophageal reflux disease)   . Hypertension   . Obesity   . Prostate cancer (Steven Church) 04/07/2015   bone mets, started oncology 03/2015   Social History   Social History  . Marital status: Divorced    Spouse name: Steven Church  . Number of children: Steven Church  . Years of education: Steven Church   Social History Main Topics  . Smoking status: Former Smoker    Packs/day: 1.00    Years:  18.00    Types: Cigarettes  . Smokeless tobacco: Never Used  . Alcohol use No  . Drug use: No  . Sexual activity: Yes   Other Topics Concern  . None   Social History Narrative   Single, works Engineer, maintenance, Theme park manager, has strained relationship with sister and son.  Exercise with walking   Family History  Problem Relation Age of Onset  . Hypertension Mother   . Other Mother     died of old age  . Diabetes Father     died of diabetic infection complications  . Cancer Sister     lung  . Alcohol abuse Brother   . Obesity Sister   . Hypertension Brother    Scheduled Meds: . amLODipine  10 mg Oral Daily  . calcitRIOL  0.5 mcg Oral BID  . calcium-vitamin D  1 tablet Oral BID  . dexamethasone  4 mg Intravenous Q4H  . enoxaparin (LOVENOX) injection  40 mg Subcutaneous Q24H  . feeding supplement (ENSURE ENLIVE)  237 mL Oral BID BM  . finasteride  5 mg Oral Daily  . pantoprazole  80 mg Oral Daily  . potassium chloride SA  20 mEq Oral BID  . tamsulosin  0.4 mg Oral Daily   Continuous Infusions: PRN Meds:.acetaminophen **OR** acetaminophen, ondansetron **OR** ondansetron (ZOFRAN) IV, oxyCODONE Allergies  Allergen Reactions  . Losartan Potassium-Hctz Other (See Comments)    dizziness   Review of Systems  Unable to perform ROS: Other  Constitutional: Positive for activity change, appetite change and fatigue.  Neurological: Positive for weakness.    Physical Exam  Constitutional: He appears well-developed.  Thin, frail  HENT:  Head: Normocephalic and atraumatic.  Cardiovascular: Normal rate.   Pulmonary/Chest: Effort normal and breath sounds normal. No accessory muscle usage. No tachypnea. No respiratory distress.  Abdominal: Soft. Normal appearance and bowel sounds are normal.  Neurological: He is alert. He is disoriented.  Psychiatric: His speech is delayed. Cognition and memory are impaired.  Nursing note and vitals reviewed.   Vital Signs: BP 100/72  (BP Location: Left Arm)   Pulse 98   Temp 98.7 F (37.1 C) (Oral)   Resp 18   Ht '5\' 10"'  (1.778 m)   Wt 62.3 kg (137 lb 5.6 oz)   SpO2 100%   BMI 19.71 kg/m  Pain Assessment: 0-10   Pain Score: 0-No pain   SpO2: SpO2: 100 % O2 Device:SpO2: 100 % O2 Flow Rate: .   IO: Intake/output summary:  Intake/Output Summary (Last 24 hours) at 02/07/17 1521 Last data filed at 02/07/17 0951  Gross per 24 hour  Intake  0 ml  Output              150 ml  Net             -150 ml    LBM: Last BM Date: 02/06/17 Baseline Weight: Weight: 62.3 kg (137 lb 5.6 oz) Most recent weight: Weight: 62.3 kg (137 lb 5.6 oz)     Palliative Assessment/Data:     Time Total: 55mn  Greater than 50%  of this time was spent counseling and coordinating care related to the above assessment and plan.  Signed by: AVinie Sill NP Palliative Medicine Team Pager # [phone](M-F 8a-5p) Team Phone # [phone](Nights/Weekends)

## 2017-02-07 NOTE — Progress Notes (Signed)
Reason for Consult: Metastatic prostate cancer screening Referring Physician: Medicine  Steven Akin Sr. is an 66 y.o. male.  HPI: 66 year old with end-stage prostate cancer. Patient has failed prior radiation therapy and chemotherapy. He presents now with increasing confusion. Workup demonstrates an enlarging left scrotal incision with extension into the epidural subdural space was significant underlying neuroforaminal vasogenic edema and midline shift.  Past Medical History:  Diagnosis Date  . Bone cancer ([place])   . Brain cancer ([place])   . Family history of cancer   . Former smoker   . GERD (gastroesophageal reflux disease)   . Hypertension   . Obesity   . Prostate cancer ([place]) 04/07/2015   bone mets, started oncology 03/2015    Past Surgical History:  Procedure Laterality Date  . COLONOSCOPY     never  . IR GENERIC HISTORICAL  10/19/2016   IR FLUORO GUIDE PORT INSERTION RIGHT 10/19/2016 Markus Daft, MD [facility]  . IR GENERIC HISTORICAL  10/19/2016   IR US GUIDE VASC ACCESS RIGHT 10/19/2016 Markus Daft, MD [facility]    Family History  Problem Relation Age of Onset  . Hypertension Mother   . Other Mother     died of old age  . Diabetes Father     died of diabetic infection complications  . Cancer Sister     lung  . Alcohol abuse Brother   . Obesity Sister   . Hypertension Brother     Social History:  reports that he has quit smoking. His smoking use included Cigarettes. He has a 18.00 pack-year smoking history. He has never used smokeless tobacco. He reports that he does not drink alcohol or use drugs.  Allergies:  Allergies  Allergen Reactions  . Losartan Potassium-Hctz Other (See Comments)    dizziness    Medications: I have reviewed the patient's current medications.  Results for orders placed or performed during the hospital encounter of 02/06/17 (from the past 48 hour(s))  CBC with Differential/Platelet     Status: Abnormal   Collection Time: 02/06/17   9:30 PM  Result Value Ref Range   WBC 13.8 (H) 4.0 - 10.5 K/uL   RBC 2.78 (L) 4.22 - 5.81 MIL/uL   Hemoglobin 8.3 (L) 13.0 - 17.0 g/dL   HCT 24.1 (L) 39.0 - 52.0 %   MCV 86.7 78.0 - 100.0 fL   MCH 29.9 26.0 - 34.0 pg   MCHC 34.4 30.0 - 36.0 g/dL   RDW 17.4 (H) 11.5 - 15.5 %   Platelets 206 150 - 400 K/uL   Neutrophils Relative % 84 %   Neutro Abs 11.5 (H) 1.7 - 7.7 K/uL   Lymphocytes Relative 9 %   Lymphs Abs 1.2 0.7 - 4.0 K/uL   Monocytes Relative 7 %   Monocytes Absolute 1.0 0.1 - 1.0 K/uL   Eosinophils Relative 0 %   Eosinophils Absolute 0.0 0.0 - 0.7 K/uL   Basophils Relative 0 %   Basophils Absolute 0.0 0.0 - 0.1 K/uL  Comprehensive metabolic panel     Status: Abnormal   Collection Time: 02/06/17  9:30 PM  Result Value Ref Range   Sodium 139 135 - 145 mmol/L   Potassium 3.3 (L) 3.5 - 5.1 mmol/L   Chloride 109 101 - 111 mmol/L   CO2 19 (L) 22 - 32 mmol/L   Glucose, Bld 90 65 - 99 mg/dL   BUN 9 6 - 20 mg/dL   Creatinine, Ser 0.74 0.61 - 1.24 mg/dL  Calcium 9.0 8.9 - 10.3 mg/dL   Total Protein 7.0 6.5 - 8.1 g/dL   Albumin 3.1 (L) 3.5 - 5.0 g/dL   AST 30 15 - 41 U/L   ALT 9 (L) 17 - 63 U/L   Alkaline Phosphatase 172 (H) 38 - 126 U/L   Total Bilirubin 1.2 0.3 - 1.2 mg/dL   GFR calc non Af Amer >60 >60 mL/min   GFR calc Af Amer >60 >60 mL/min    Comment: (NOTE) The eGFR has been calculated using the CKD EPI equation. This calculation has not been validated in all clinical situations. eGFR's persistently <60 mL/min signify possible Chronic Kidney Disease.    Anion gap 11 5 - 15  CK     Status: Abnormal   Collection Time: 02/06/17  9:30 PM  Result Value Ref Range   Total CK 466 (H) 49 - 397 U/L  Ethanol     Status: None   Collection Time: 02/06/17  9:30 PM  Result Value Ref Range   Alcohol, Ethyl (B) <5 <5 mg/dL    Comment:        LOWEST DETECTABLE LIMIT FOR SERUM ALCOHOL IS 5 mg/dL FOR MEDICAL PURPOSES ONLY   Rapid urine drug screen (hospital performed)      Status: Abnormal   Collection Time: 02/06/17 11:31 PM  Result Value Ref Range   Opiates NONE DETECTED NONE DETECTED   Cocaine NONE DETECTED NONE DETECTED   Benzodiazepines POSITIVE (A) NONE DETECTED   Amphetamines NONE DETECTED NONE DETECTED   Tetrahydrocannabinol NONE DETECTED NONE DETECTED   Barbiturates NONE DETECTED NONE DETECTED    Comment:        DRUG SCREEN FOR MEDICAL PURPOSES ONLY.  IF CONFIRMATION IS NEEDED FOR ANY PURPOSE, NOTIFY LAB WITHIN 5 DAYS.        LOWEST DETECTABLE LIMITS FOR URINE DRUG SCREEN Drug Class       Cutoff (ng/mL) Amphetamine      1000 Barbiturate      200 Benzodiazepine   244 Tricyclics       010 Opiates          300 Cocaine          300 THC              50   Urinalysis, Routine w reflex microscopic     Status: Abnormal   Collection Time: 02/06/17 11:31 PM  Result Value Ref Range   Color, Urine YELLOW YELLOW   APPearance CLEAR CLEAR   Specific Gravity, Urine 1.012 1.005 - 1.030   pH 6.0 5.0 - 8.0   Glucose, UA NEGATIVE NEGATIVE mg/dL   Hgb urine dipstick SMALL (A) NEGATIVE   Bilirubin Urine NEGATIVE NEGATIVE   Ketones, ur 80 (A) NEGATIVE mg/dL   Protein, ur NEGATIVE NEGATIVE mg/dL   Nitrite NEGATIVE NEGATIVE   Leukocytes, UA NEGATIVE NEGATIVE   RBC / HPF 0-5 0 - 5 RBC/hpf   WBC, UA 0-5 0 - 5 WBC/hpf   Bacteria, UA NONE SEEN NONE SEEN   Squamous Epithelial / LPF 0-5 (A) NONE SEEN   Mucous PRESENT    Granular Casts, UA PRESENT     Ct Head Wo Contrast  Result Date: 02/06/2017 CLINICAL DATA:  66 year old male with history of metastatic prostate cancer presenting with fall. EXAM: CT HEAD WITHOUT CONTRAST CT CERVICAL SPINE WITHOUT CONTRAST TECHNIQUE: Multidetector CT imaging of the head and cervical spine was performed following the standard protocol without intravenous contrast. Multiplanar CT image reconstructions of the  cervical spine were also generated. COMPARISON:  Head CT dated 03/11/2016 an MRI dated 03/11/2016 and bone scan dated  10/06/2016 FINDINGS: CT HEAD FINDINGS Brain: Lobulated high attenuating masses along the left frontal dural surface most consistent with metastatic disease. Subdural hemorrhage is less likely but not entirely excluded. Overall there has been interval increase in the size of the dural based masses compared to the prior CT and MRI. These measure up to 11 mm in thickness. There is associated mass effect and compression of the adjacent brain parenchyma with effacement of the sulci and associated mass effect on the left lateral ventricle. There is approximately 8 mm left-to-right midline shift which is increased since the prior CT. Moderate periventricular and deep white matter hypodensities consistent with chronic microvascular ischemic changes. An area of white matter hypodensity in the left frontal lobe likely combination of chronic microvascular ischemic changes and vasogenic edema. Vascular: No hyperdense vessel or unexpected calcification. Skull: Partially calcified or expansile skullbase masses involving the left frontal bone as well as left temporal bone have increased in size compared to the prior CT. A lobulated component of the mass in the left frontal cortex measures 3.2 x 2.1 cm. Sinuses/Orbits: There is opacification of the left frontal sinus as well as opacification of multiple ethmoid air cells. The remainder of the visualized paranasal sinuses and mastoid air cells are clear. Other: Left frontal and temporal extra calvarial soft tissue thickening may represent hematoma. Extension of metastatic tumor is not excluded. CT CERVICAL SPINE FINDINGS Alignment: No acute subluxation. There is reversal of normal cervical lordosis which may be positional or due to muscle spasm or secondary to degenerative changes. Skull base and vertebrae: There is no acute fracture. There is expansile osseous metastatic disease involving the lower cervical and visualized upper thoracic spine and ribs with sclerotic changes. Soft  tissues and spinal canal: No prevertebral fluid or swelling. No visible canal hematoma. Disc levels: Multilevel degenerative changes with disc space narrowing and endplate irregularity most prominent at C7-T1 Upper chest: A partially visualized 2.2 x 3.2 cm soft tissue nodular density in the right supraclavicular region likely represents an enlarged lymph node. Chest CT may provide better evaluation. A right sided Port-A-Cath is partially visualized. Other: None IMPRESSION: 1. Lobulated high attenuating dural-based masses along the left frontal convexity with interval increase in size since the prior studies most consistent with progression of metastatic disease. Superimposed subdural hemorrhage is not entirely excluded. There is associated mass effect with approximately 8 mm left-to-right midline shift, increased since the prior CTs. Close follow-up with CT or further evaluation with MRI without and with contrast is recommended. 2. Calvarial expansile metastatic disease involving the left frontal and temporal lobes with increase in the size of the osseous metastatic disease since the prior CT. 3. No acute/traumatic cervical spine pathology. 4. Osseous sclerotic and expansile metastatic disease involving the lower cervical and visualized upper thoracic spine and ribs. 5. Partially visualized ovoid soft tissue lesion in the right supraclavicular region is concerning for metastatic disease. CT of the chest may provide better characterisation if clinically indicated. These results were called by telephone at the time of interpretation on 02/06/2017 at 9:52 pm to Dr. Julianne Rice , who verbally acknowledged these results. Electronically Signed   By: Anner Crete M.D.   On: 02/06/2017 22:03   Ct Cervical Spine Wo Contrast  Result Date: 02/06/2017 CLINICAL DATA:  66 year old male with history of metastatic prostate cancer presenting with fall. EXAM: CT HEAD WITHOUT CONTRAST CT CERVICAL  SPINE WITHOUT CONTRAST  TECHNIQUE: Multidetector CT imaging of the head and cervical spine was performed following the standard protocol without intravenous contrast. Multiplanar CT image reconstructions of the cervical spine were also generated. COMPARISON:  Head CT dated 03/11/2016 an MRI dated 03/11/2016 and bone scan dated 10/06/2016 FINDINGS: CT HEAD FINDINGS Brain: Lobulated high attenuating masses along the left frontal dural surface most consistent with metastatic disease. Subdural hemorrhage is less likely but not entirely excluded. Overall there has been interval increase in the size of the dural based masses compared to the prior CT and MRI. These measure up to 11 mm in thickness. There is associated mass effect and compression of the adjacent brain parenchyma with effacement of the sulci and associated mass effect on the left lateral ventricle. There is approximately 8 mm left-to-right midline shift which is increased since the prior CT. Moderate periventricular and deep white matter hypodensities consistent with chronic microvascular ischemic changes. An area of white matter hypodensity in the left frontal lobe likely combination of chronic microvascular ischemic changes and vasogenic edema. Vascular: No hyperdense vessel or unexpected calcification. Skull: Partially calcified or expansile skullbase masses involving the left frontal bone as well as left temporal bone have increased in size compared to the prior CT. A lobulated component of the mass in the left frontal cortex measures 3.2 x 2.1 cm. Sinuses/Orbits: There is opacification of the left frontal sinus as well as opacification of multiple ethmoid air cells. The remainder of the visualized paranasal sinuses and mastoid air cells are clear. Other: Left frontal and temporal extra calvarial soft tissue thickening may represent hematoma. Extension of metastatic tumor is not excluded. CT CERVICAL SPINE FINDINGS Alignment: No acute subluxation. There is reversal of normal  cervical lordosis which may be positional or due to muscle spasm or secondary to degenerative changes. Skull base and vertebrae: There is no acute fracture. There is expansile osseous metastatic disease involving the lower cervical and visualized upper thoracic spine and ribs with sclerotic changes. Soft tissues and spinal canal: No prevertebral fluid or swelling. No visible canal hematoma. Disc levels: Multilevel degenerative changes with disc space narrowing and endplate irregularity most prominent at C7-T1 Upper chest: A partially visualized 2.2 x 3.2 cm soft tissue nodular density in the right supraclavicular region likely represents an enlarged lymph node. Chest CT may provide better evaluation. A right sided Port-A-Cath is partially visualized. Other: None IMPRESSION: 1. Lobulated high attenuating dural-based masses along the left frontal convexity with interval increase in size since the prior studies most consistent with progression of metastatic disease. Superimposed subdural hemorrhage is not entirely excluded. There is associated mass effect with approximately 8 mm left-to-right midline shift, increased since the prior CTs. Close follow-up with CT or further evaluation with MRI without and with contrast is recommended. 2. Calvarial expansile metastatic disease involving the left frontal and temporal lobes with increase in the size of the osseous metastatic disease since the prior CT. 3. No acute/traumatic cervical spine pathology. 4. Osseous sclerotic and expansile metastatic disease involving the lower cervical and visualized upper thoracic spine and ribs. 5. Partially visualized ovoid soft tissue lesion in the right supraclavicular region is concerning for metastatic disease. CT of the chest may provide better characterisation if clinically indicated. These results were called by telephone at the time of interpretation on 02/06/2017 at 9:52 pm to Dr. Julianne Rice , who verbally acknowledged these  results. Electronically Signed   By: Anner Crete M.D.   On: 02/06/2017 22:03   Mr  Brain W And Wo Contrast  Result Date: 02/07/2017 CLINICAL DATA:  Intracranial metastatic disease and possible intracranial hemorrhage. EXAM: MRI HEAD WITHOUT AND WITH CONTRAST TECHNIQUE: Multiplanar, multiecho pulse sequences of the brain and surrounding structures were obtained without and with intravenous contrast. CONTRAST:  71m MULTIHANCE GADOBENATE DIMEGLUMINE 529 MG/ML IV SOLN COMPARISON:  Head CT 02/06/2017 Brain MRI 03/11/2016 FINDINGS: Brain: There is a large, predominantly calcified extra-axial mass the left frontal convexity that exerts mass effect on the underlying brain and causes vasogenic edema pain and rightward midline shift of 7 mm. The mass expands part of the left frontoparietal calvarium and extends through the skull to include a large extracranial component. Altogether, the mass measures approximately 7.4 x 2.7 cm cyst, though its irregular shape limits the accuracy of the measurement. There is dural thickening and contrast enhancement throughout the left hemisphere. A small focus of high DWI signal underlies the mass without diffusion restriction on the ADC map. There is no discrete subdural hematoma. The hyperdense material on the CT corresponds to a portion of the solid tumor. There is bilateral periventricular white matter hyperintensity, consistent with chronic microvascular ischemia. Vascular: Major intracranial arterial and venous sinus flow voids are preserved. No evidence of chronic microhemorrhage or amyloid angiopathy. Skull and upper cervical spine: Left frontoparietal calvarial centered lesion, as above. No other discrete osseous lesions are identified. Sinuses/Orbits: Left frontal mucosal thickening. Normal orbits. IMPRESSION: 1. Large metastatic lesion centered in the left frontoparietal calvarium with large intracranial dural and extracranial soft tissue components with reactive  thickening of much of the left hemispheric dura. 2. Vasogenic edema within the brain adjacent to the above-described lesion with 7 mm of rightward midline shift and early subfalcine herniation. 3. No subdural hematoma or acute ischemia. Electronically Signed   By: KUlyses JarredM.D.   On: 02/07/2017 03:35    Pertinent items are noted in HPI. Blood pressure 110/62, pulse 90, temperature 98.2 F (36.8 C), temperature source Oral, resp. rate 18, height '5\' 10"'  (1.778 m), weight 62.3 kg (137 lb 5.6 oz), SpO2 100 %. The patient is allergic to work. He is oriented to person and place. He is unsure of time. He appears to understand her conversation. His left eye is swollen shut. Patient will Appears Normal Bilaterally. Extraocular Movements Are Full. Facial Movement Normal Bilaterally. Motor 5/5 in Bilateral Upper and Lower Extremities.  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  Craniectomy and resection of this lesion is likely to provide some palliative improvement although certainly will not alter his disease course. Surgery would be of reasonably low morbidity and could be considered. I will await decision of the patient's care team and family with regard to appropriateness of surgical care. Should palliative surgical resection be acceptable to all then we could consider doing this on Friday afternoon. Jacqueline Delapena A 02/07/2017, 6:28 PM

## 2017-02-07 NOTE — ED Notes (Signed)
Contacted by Patient Placement. Pt will receive bed assignment at [facility], but not until after 0700 this morning d/t staffing.

## 2017-02-07 NOTE — Telephone Encounter (Signed)
Needs Calcium and Vit D tablets called to [facility] pharmacy.

## 2017-02-07 NOTE — ED Provider Notes (Addendum)
4:17 AM Mr Brain W And Wo Contrast  IMPRESSION: 1. Large metastatic lesion centered in the left frontoparietal calvarium with large intracranial dural and extracranial soft tissue components with reactive thickening of much of the left hemispheric dura. 2. Vasogenic edema within the brain adjacent to the above-described lesion with 7 mm of rightward midline shift and early subfalcine herniation. 3. No subdural hematoma or acute ischemia. Electronically Signed   By: Ulyses Jarred M.D.   On: 02/07/2017 03:35     IV Decadron given on arrival to ER. MRI demonstrates metastatic lesion with mass effect. No weakness of arms or legs at this time. Will need admission to [facility] for evaluation by radiation onc and general onc in the AM. NPO now until swallow study   EKG Interpretation  Date/Time:  Monday February 06 2017 21:06:58 EDT Ventricular Rate:  96 PR Interval:    QRS Duration: 97 QT Interval:  366 QTC Calculation: 463 R Axis:   5 Text Interpretation:  Sinus rhythm Anteroseptal infarct, old Baseline wander in lead(s) V5 No significant change was found Confirmed by Kajah Santizo  MD, Eugenio Dollins ([id number]) on 02/07/2017 4:13:56 AM      Hemoglobin  Date Value Ref Range Status  02/06/2017 8.3 (L) 13.0 - 17.0 g/dL Final   HGB  Date Value Ref Range Status  01/13/2017 9.2 (L) 13.0 - 17.1 g/dL Final  12/26/2016 7.9 (L) 13.0 - 17.1 g/dL Final  12/23/2016 7.9 (L) 13.0 - 17.1 g/dL Final  12/02/2016 9.8 (L) 13.0 - 17.1 g/dL Final        Jola Schmidt, MD 02/07/17 [place], MD 02/07/17 [phone]

## 2017-02-08 ENCOUNTER — Encounter: Payer: Medicare Other | Admitting: Medical

## 2017-02-08 DIAGNOSIS — Z66 Do not resuscitate: Secondary | ICD-10-CM

## 2017-02-08 LAB — BASIC METABOLIC PANEL
ANION GAP: 10 (ref 5–15)
BUN: 11 mg/dL (ref 6–20)
CHLORIDE: 106 mmol/L (ref 101–111)
CO2: 20 mmol/L — ABNORMAL LOW (ref 22–32)
CREATININE: 0.72 mg/dL (ref 0.61–1.24)
Calcium: 9.8 mg/dL (ref 8.9–10.3)
GFR calc non Af Amer: >60 mL/min (ref >60)
Glucose, Bld: 167 mg/dL — ABNORMAL HIGH (ref 65–99)
Potassium: 4 mmol/L (ref 3.5–5.1)
Sodium: 136 mmol/L (ref 135–145)

## 2017-02-08 LAB — CBC
HEMATOCRIT: 26 % — AB (ref 39.0–52.0)
HEMOGLOBIN: 8.9 g/dL — AB (ref 13.0–17.0)
MCH: 29 pg (ref 26.0–34.0)
MCHC: 34.2 g/dL (ref 30.0–36.0)
MCV: 84.7 fL (ref 78.0–100.0)
Platelets: 249 10*3/uL (ref 150–400)
RBC: 3.07 MIL/uL — ABNORMAL LOW (ref 4.22–5.81)
RDW: 17.3 % — ABNORMAL HIGH (ref 11.5–15.5)
WBC: 13.8 10*3/uL — ABNORMAL HIGH (ref 4.0–10.5)

## 2017-02-08 LAB — CK: CK TOTAL: 751 U/L — AB (ref 49–397)

## 2017-02-08 MED ORDER — LORAZEPAM 2 MG/ML IJ SOLN
0.5000 mg | Freq: Four times a day (QID) | INTRAMUSCULAR | Status: DC | PRN
  Administered 2017-02-08: 0.5 mg via INTRAVENOUS
  Filled 2017-02-08: qty 1

## 2017-02-08 NOTE — Progress Notes (Signed)
OT Cancellation Note  Patient Details Name: Steven Church. MRN: [id number] DOB: [birth year]   Cancelled Treatment:    Reason Eval/Treat Not Completed: Other (comment).  Noted pt is moving to comfort care.  Will sign off.  Zoeya Gramajo 02/08/2017, 3:51 PM  Lesle Chris, OTR/L [phone] 02/08/2017

## 2017-02-08 NOTE — Progress Notes (Addendum)
PROGRESS NOTE  Steven Church Sr.  FUX:[id number] DOB: 08-06-1951 DOA: 02/06/2017   PCP: [name], PA-C   Brief Narrative: 66 y.o. male with a past medical history significant for HTN and metastatic prostate cancer who presented with increased confusion from home after being found between the bed and the wall. On arrival he was afebrile in no distress. CK was elevated at 466, UDS +benzodiazepines. CT of the head showed interval progression of dural metastases and calavarial mets and worsening midline shift, possible SDH. Urgent MRI was recommended, for which the patient was transferred to [facility]. This demonstrated no SDH but vasogenic edema around worsening metastasis. He was transferred back to [facility] for admission. Oncology and palliative care were consulted. Dr. Burr Medico is to discuss with radiation oncology.   Patient was initially diagnosed in 2016 with prostate cancer, metastatic at the time of diagnosis.  He had brain mets noted early 2017 and received whole brain radiation therapy in Apr 2017.  Had treatment failure with androgen deprivation and so since January 2018 has been on Taxotere, which is currently being continued because the patient seemed to be having improved functional status on therapy.  Assessment & Plan:  Fall, acute encephalopathy due to cerebral edema related to brain/skull metastasis - H/o irradiation. Up to 51mm midline shift was noted as long ago as last June CT head, and so presumably his current mental status change is more from edema.  - PCT meeting completed this AM, plan for hospice facility, no plan for surgery per family's request  - Continue IV decadron  - Fall precautions - CK elevation noted, will continue Isotonic fluids, no evidence of overload. Recheck in AM.  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  - Continue norvasc   Anemia of chronic disease, prostate cancer - no signs of active bleeding    Leukocytosis - with T 93F, WBC 13.8, HR up to 105 - suspect reactive process from cerebral edema in the setting of brain and skull metastasis - pt also on decadron so likely contributing to leukocytosis - focus on comfort per family request   Hypokalemia - Mild, repleted at admission.  - WNL this AM   Subjective: Pt without pain, currently alert, awake, follows simple commands.  Objective: BP 128/88 (BP Location: Left Arm)   Pulse (!) 105   Temp 99 F (37.2 C) (Oral)   Resp 18   Ht 5\' 10"  (1.778 m)   Wt 63.3 kg (139 lb 9.6 oz)   SpO2 99%   BMI 20.03 kg/m   Gen: 66yo M alert and calm, NAD CV: RRR, no murmur, JVD or edema Pulm: Nonlabored, clear Neuro: Appropriately interactive, oriented to person only.   CBC: CBC Latest Ref Rng & Units 02/08/2017 02/06/2017 01/13/2017  WBC 4.0 - 10.5 K/uL 13.8(H) 13.8(H) 10.7(H)  Hemoglobin 13.0 - 17.0 g/dL 8.9(L) 8.3(L) 9.2(L)  Hematocrit 39.0 - 52.0 % 26.0(L) 24.1(L) 26.9(L)  Platelets 150 - 400 K/uL 249 206 237   Scheduled medications: . amLODipine  10 mg Oral Daily  . calcitRIOL  0.5 mcg Oral BID  . calcium-vitamin D  1 tablet Oral BID  . chlorhexidine  15 mL Mouth Rinse BID  . dexamethasone  4 mg Intravenous Q4H  . enoxaparin (LOVENOX) injection  40 mg Subcutaneous Q24H  . feeding supplement (ENSURE ENLIVE)  237 mL Oral BID BM  . finasteride  5 mg Oral Daily  . mouth rinse  15 mL  Mouth Rinse q12n4p  . pantoprazole  80 mg Oral Daily  . polyvinyl alcohol  1 drop Left Eye QID  . potassium chloride SA  20 mEq Oral BID  . tamsulosin  0.4 mg Oral Daily    Faye Ramsay, MD [facility] Pager [phone]  If 7PM-7AM, please contact night-coverage [email or web address] Password [facility] 02/08/2017, 11:44 AM

## 2017-02-08 NOTE — Evaluation (Signed)
Physical Therapy Evaluation Patient Details Name: Steven PETTER Sr. MRN: [id number] DOB: April 20, 1951 Today's Date: 02/08/2017   History of Present Illness  66 year old with end-stage prostate cancer. Patient has failed prior radiation therapy and chemotherapy. He presents now with increasing confusion. Workup demonstrates an enlarging left skull invasion with extension into the epidural subdural space was significant underlying neuroforaminal vasogenic edema and midline shift. Pt had a fall just prior to admission.  Clinical Impression  Pt admitted with above diagnosis. Pt currently with functional limitations due to the deficits listed below (see PT Problem List). Pt ambulated 160' with RW and min A for managing RW and balance. Pt not oriented to location nor to situation but can follow simple commands. 24* supervision recommended due to confusion.  Pt will benefit from skilled PT to increase their independence and safety with mobility to allow discharge to the venue listed below.       Follow Up Recommendations SNF;Supervision for mobility/OOB    Equipment Recommendations  Rolling walker with 5" wheels    Recommendations for Other Services       Precautions / Restrictions Precautions Precautions: Fall Precaution Comments: confusion Restrictions Weight Bearing Restrictions: No      Mobility  Bed Mobility Overal bed mobility: Needs Assistance Bed Mobility: Supine to Sit     Supine to sit: Mod assist     General bed mobility comments: mod A to raise trunk  Transfers Overall transfer level: Needs assistance Equipment used: Rolling walker (2 wheeled) Transfers: Sit to/from Stand Sit to Stand: Min assist         General transfer comment: VCs hand placement, min A to scoot to EOB  Ambulation/Gait Ambulation/Gait assistance: Min assist Ambulation Distance (Feet): 160 Feet Assistive device: Rolling walker (2 wheeled) Gait Pattern/deviations: Step-through pattern    Gait velocity interpretation: Below normal speed for age/gender General Gait Details: mild leaning to R, mildly unsteady but no LOB, min A to manage RW and for balance  Stairs            Wheelchair Mobility    Modified Rankin (Stroke Patients Only)       Balance Overall balance assessment: Needs assistance   Sitting balance-Leahy Scale: Good       Standing balance-Leahy Scale: Fair                               Pertinent Vitals/Pain Pain Assessment: No/denies pain    Home Living Family/patient expects to be discharged to:: Private residence Living Arrangements: Alone Available Help at Discharge: Family Type of Home: House       Home Layout: One level Home Equipment: None      Prior Function           Comments: per chart, pt was independent with mobility 11 months ago, no family present to give current status, pt confused     Hand Dominance   Dominant Hand: Right    Extremity/Trunk Assessment   Upper Extremity Assessment Upper Extremity Assessment: Overall WFL for tasks assessed    Lower Extremity Assessment Lower Extremity Assessment: Overall WFL for tasks assessed    Cervical / Trunk Assessment Cervical / Trunk Assessment: Normal  Communication   Communication: Expressive difficulties (increased time)  Cognition Arousal/Alertness: Awake/alert Behavior During Therapy: WFL for tasks assessed/performed Overall Cognitive Status: No family/caregiver present to determine baseline cognitive functioning (pt not oriented to location/situation, able to follow simple commands and can state birthdate)  Area of Impairment: Orientation;Memory                 Orientation Level: Place;Time;Situation   Memory: Decreased short-term memory                General Comments      Exercises     Assessment/Plan    PT Assessment Patient needs continued PT services  PT Problem List Decreased balance;Decreased cognition;Decreased  mobility;Decreased safety awareness       PT Treatment Interventions Gait training;Functional mobility training;Therapeutic exercise;Therapeutic activities;Patient/family education    PT Goals (Current goals can be found in the Care Plan section)  Acute Rehab PT Goals Patient Stated Goal: none stated PT Goal Formulation: Patient unable to participate in goal setting Time For Goal Achievement: 02/22/17 Potential to Achieve Goals: Good    Frequency Min 3X/week   Barriers to discharge        Co-evaluation               End of Session Equipment Utilized During Treatment: Gait belt Activity Tolerance: Patient tolerated treatment well Patient left: in chair;with call bell/phone within reach;with chair alarm set Nurse Communication: Mobility status PT Visit Diagnosis: History of falling (Z91.81);Difficulty in walking, not elsewhere classified (R26.2)    Time: 0737-1062 PT Time Calculation (min) (ACUTE ONLY): 27 min   Charges:   PT Evaluation $PT Eval Low Complexity: 1 Procedure PT Treatments $Gait Training: 8-22 mins   PT G Codes:          Philomena Doheny 02/08/2017, 2:30 PM [phone]

## 2017-02-08 NOTE — Progress Notes (Addendum)
Daily Progress Note   Patient Name: Steven MILER Sr.       Date: 02/08/2017 DOB: 1951/10/09  Age: 66 y.o. MRN#: [id number] Attending Physician: Theodis Blaze, MD Primary Care Physician: [name], PA-C Admit Date: 02/06/2017  Reason for Consultation/Follow-up: Establishing goals of care r/t end stage metastatic prostate cancer.   Subjective: Steven Church is lying in bed. Unchanged from yesterday. Family at [facility].   Length of Stay: 1  Current Medications: Scheduled Meds:  . amLODipine  10 mg Oral Daily  . calcitRIOL  0.5 mcg Oral BID  . calcium-vitamin D  1 tablet Oral BID  . chlorhexidine  15 mL Mouth Rinse BID  . dexamethasone  4 mg Intravenous Q4H  . enoxaparin (LOVENOX) injection  40 mg Subcutaneous Q24H  . feeding supplement (ENSURE ENLIVE)  237 mL Oral BID BM  . finasteride  5 mg Oral Daily  . mouth rinse  15 mL Mouth Rinse q12n4p  . pantoprazole  80 mg Oral Daily  . polyvinyl alcohol  1 drop Left Eye QID  . potassium chloride SA  20 mEq Oral BID  . tamsulosin  0.4 mg Oral Daily    Continuous Infusions:   PRN Meds: acetaminophen **OR** acetaminophen, LORazepam, ondansetron **OR** ondansetron (ZOFRAN) IV, oxyCODONE  Physical Exam  Constitutional: He appears well-developed. He appears cachectic.  Cardiovascular: Normal rate.   Pulmonary/Chest: Effort normal and breath sounds normal. No accessory muscle usage. No tachypnea. No respiratory distress.  Abdominal: Soft. Normal appearance.  Neurological: He is alert.  Mostly oriented  Psychiatric: His speech is delayed. Cognition and memory are impaired.  Nursing note and vitals reviewed.           Vital Signs: BP 128/88 (BP Location: Left Arm)   Pulse (!) 105   Temp 99 F (37.2 C) (Oral)   Resp 18    Ht '5\' 10"'  (1.778 m)   Wt 63.3 kg (139 lb 9.6 oz)   SpO2 99%   BMI 20.03 kg/m  SpO2: SpO2: 99 % O2 Device: O2 Device: Not Delivered O2 Flow Rate:    Intake/output summary:  Intake/Output Summary (Last 24 hours) at 02/08/17 0842 Last data filed at 02/07/17 2100  Gross per 24 hour  Intake              600  ml  Output              375 ml  Net              225 ml   LBM: Last BM Date: 02/06/17 Baseline Weight: Weight: 62.3 kg (137 lb 5.6 oz) Most recent weight: Weight: 63.3 kg (139 lb 9.6 oz)       Palliative Assessment/Data:      Patient Active Problem List   Diagnosis Date Noted  . Neoplasm of brain causing mass effect on adjacent structures ([place]) 02/07/2017  . Advanced care planning/counseling discussion   . Dysarthria 03/11/2016  . Acute encephalopathy 03/11/2016  . Chronic anemia 03/11/2016  . CAP (community acquired pneumonia) 03/11/2016  . Hypokalemia   . Hypomagnesemia   . HCAP (healthcare-associated pneumonia)   . Goals of care, counseling/discussion   . Aphasia   . Prostate cancer metastatic to bone ([place])   . Palliative care encounter   . Brain metastasis ([place]) 01/25/2016  . Prostate cancer ([place]) 04/07/2015  . Retroperitoneal lymphadenopathy   . Mediastinal lymphadenopathy 03/12/2015  . Lung mass 03/09/2015  . Essential hypertension 07/11/2014  . Rhinitis, allergic 07/11/2014    Palliative Care Assessment & Plan   HPI: 66 y.o. 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 Shift to the right has only increased slightly from 5.6 mm  To 7 mm. PSA has increased to 716 01/13/17 from 427 12/23/16. Palliative consulted to assist with GOC/EOL discussion with patient and family d/t worsening metastatic disease despite treatment.     Assessment: I met today at [facility] along with his sister, [name], and son, Obed. We had a long discussion regarding progression of his cancer. We also discussed options moving forward including neurosurgery, code status, aggressiveness of care, SNF vs hospice care. They have decided that they wish for a more comfort path and do not wish to pursue surgery, DNR, and are hopeful for hospice placement for EOL care. Therapeutic listening and emotional support provided. Son is struggling with this but his aunt is really supportive to him as well.   Recommendations/Plan:  Weakness: Cancer related  Poor intake: Ensure BID ordered  Goals of Care and Additional Recommendations:  Limitations on Scope of Treatment: Avoid Hospitalization, Minimize Medications, No Artificial Feeding and No Surgical Procedures  Code Status:  DNR  Prognosis:   < 4 weeks  Discharge Planning:  Hospice facility  Notified Dr. Marchelle Folks office that they do not wish to pursue surgery.   Thank you for allowing the Palliative Medicine Team to assist in the care of this patient.   Total Time 1638-4536 1mn Prolonged Time Billed  yes       Greater than 50%  of this time was spent counseling and coordinating care related to the above assessment and plan.  AVinie Sill NP Palliative Medicine Team Pager # [phone](M-F 8a-5p) Team Phone # [phone](Nights/Weekends)

## 2017-02-09 MED ORDER — KLOR-CON M20 20 MEQ PO TBCR: 20.0000 meq | EXTENDED_RELEASE_TABLET | Freq: Every day | ORAL | 0 refills | Status: AC

## 2017-02-09 NOTE — Progress Notes (Signed)
[facility] Liaison: ? Received a request from [place] for family interest in [facility], with request to transfer today. Met with son, Steven Church to confirm interest and explain services. Family agreeable to transfer today. CSW aware and transportation scheduled. Registration paperwork completed. ? Please fax discharge summary to [phone]. ? RN please call report to [phone]. ? Thank You, Freddi Starr RN, [facility] Liaison [phone]

## 2017-02-09 NOTE — Progress Notes (Signed)
Report phone to [place] at [facility].  Patient ready for transport.

## 2017-02-09 NOTE — Progress Notes (Signed)
CSW following for disposition planning. Palliative involved and pt's family hopeful for residential hospice. Spoke with pt's son [phone] Steven Church; states family agreeable to referral to residential hospice home, preference [facility].   Contacted Harmon Pier with [place] [facility] to initiate referral.  Will continue following.  Sharren Bridge, MSW, LCSW Clinical Social Work 02/09/2017 [phone]

## 2017-02-09 NOTE — Discharge Summary (Addendum)
Physician Discharge Summary  [place] Steven Church Sr. JFH:[id number] DOB: 1951/04/26 DOA: 02/06/2017  PCP: Steven Oxford, PA-C  Admit date: 02/06/2017 Discharge date: 02/09/2017  Recommendations for Outpatient Follow-up:  Pt will be discharged to hospice facility   Discharge Diagnoses:  Principal Problem:   Neoplasm of brain causing mass effect on adjacent structures [facility]) Active Problems:   Essential hypertension   Prostate cancer ([place])   Brain metastasis ([place])   Chronic anemia   DNR (do not resuscitate)  Discharge Condition: Stable  Diet recommendation: comfort feeding   Brief Narrative: 66 y.o.malewith a past medical history significant for HTN and metastatic prostate cancer who presented with increased confusion from home after being found between the bed and the wall. On arrival he was afebrile in no distress. CK was elevated at 466, UDS +benzodiazepines. CT of the head showed interval progression of dural metastases and calavarial mets and worsening midline shift,possible SDH. Urgent MRI was recommended, for which the patient was transferred to [facility]. This demonstrated no SDH but vasogenic edema around worsening metastasis. He was transferred back to [facility] for admission. Oncology and palliative care were consulted. Dr. Burr Medico is to discuss with radiation oncology.   Patient was initially diagnosed in 2016 with prostate cancer, metastatic at the time of diagnosis. He had brain mets noted early 2017 and received whole brain radiation therapy in Apr 2017. Had treatment failure with androgen deprivation and so since January 2018 has been on Taxotere, which is currently being continued because the patient seemed to be having improved functional status on therapy.  Assessment & Plan:  Fall, acute encephalopathy due to cerebral edema related to brain/skull metastasis - H/o irradiation. Up to 40mm midline shift was noted as long ago as last June CT head, and so presumably his  current mental status change is more from edema.  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  - Continue norvasc   Anemia of chronic disease, prostate  - no signs of active bleeding  - no further blood work in an effort to ensure comfort   Leukocytosis - with T 70F, WBC 13.8, HR up to 105 - suspect reactive process from cerebral edema in the setting of brain and skull metastasis - pt also on decadron so likely contributing to leukocytosis - focus on comfort per family request   Hypokalemia - Mild, repleted at admission.   Procedures/Studies: Ct Head Wo Contrast  Result Date: 02/06/2017 CLINICAL DATA:  66 year old male with history of metastatic prostate cancer presenting with fall. EXAM: CT HEAD WITHOUT CONTRAST CT CERVICAL SPINE WITHOUT CONTRAST TECHNIQUE: Multidetector CT imaging of the head and cervical spine was performed following the standard protocol without intravenous contrast. Multiplanar CT image reconstructions of the cervical spine were also generated. COMPARISON:  Head CT dated 03/11/2016 an MRI dated 03/11/2016 and bone scan dated 10/06/2016 FINDINGS: CT HEAD FINDINGS Brain: Lobulated high attenuating masses along the left frontal dural surface most consistent with metastatic disease. Subdural hemorrhage is less likely but not entirely excluded. Overall there has been interval increase in the size of the dural based masses compared to the prior CT and MRI. These measure up to 11 mm in thickness. There is associated mass effect and compression of  the adjacent brain parenchyma with effacement of the sulci and associated mass effect on the left lateral ventricle. There is approximately 8 mm left-to-right midline shift which is  increased since the prior CT. Moderate periventricular and deep white matter hypodensities consistent with chronic microvascular ischemic changes. An area of white matter hypodensity in the left frontal lobe likely combination of chronic microvascular ischemic changes and vasogenic edema. Vascular: No hyperdense vessel or unexpected calcification. Skull: Partially calcified or expansile skullbase masses involving the left frontal bone as well as left temporal bone have increased in size compared to the prior CT. A lobulated component of the mass in the left frontal cortex measures 3.2 x 2.1 cm. Sinuses/Orbits: There is opacification of the left frontal sinus as well as opacification of multiple ethmoid air cells. The remainder of the visualized paranasal sinuses and mastoid air cells are clear. Other: Left frontal and temporal extra calvarial soft tissue thickening may represent hematoma. Extension of metastatic tumor is not excluded. CT CERVICAL SPINE FINDINGS Alignment: No acute subluxation. There is reversal of normal cervical lordosis which may be positional or due to muscle spasm or secondary to degenerative changes. Skull base and vertebrae: There is no acute fracture. There is expansile osseous metastatic disease involving the lower cervical and visualized upper thoracic spine and ribs with sclerotic changes. Soft tissues and spinal canal: No prevertebral fluid or swelling. No visible canal hematoma. Disc levels: Multilevel degenerative changes with disc space narrowing and endplate irregularity most prominent at C7-T1 Upper chest: A partially visualized 2.2 x 3.2 cm soft tissue nodular density in the right supraclavicular region likely represents an enlarged lymph node. Chest CT may provide better evaluation. A right sided Port-A-Cath is partially visualized. Other: None IMPRESSION: 1. Lobulated high attenuating dural-based masses along the left frontal convexity with interval increase in size since the  prior studies most consistent with progression of metastatic disease. Superimposed subdural hemorrhage is not entirely excluded. There is associated mass effect with approximately 8 mm left-to-right midline shift, increased since the prior CTs. Close follow-up with CT or further evaluation with MRI without and with contrast is recommended. 2. Calvarial expansile metastatic disease involving the left frontal and temporal lobes with increase in the size of the osseous metastatic disease since the prior CT. 3. No acute/traumatic cervical spine pathology. 4. Osseous sclerotic and expansile metastatic disease involving the lower cervical and visualized upper thoracic spine and ribs. 5. Partially visualized ovoid soft tissue lesion in the right supraclavicular region is concerning for metastatic disease. CT of the chest may provide better characterisation if clinically indicated. These results were called by telephone at the time of interpretation on 02/06/2017 at 9:52 pm to Dr. Julianne Rice , who verbally acknowledged these results. Electronically Signed   By: Anner Crete M.D.   On: 02/06/2017 22:03   Ct Cervical Spine Wo Contrast  Result Date: 02/06/2017 CLINICAL DATA:  66 year old male with history of metastatic prostate cancer presenting with fall. EXAM: CT HEAD WITHOUT CONTRAST CT CERVICAL SPINE WITHOUT CONTRAST TECHNIQUE: Multidetector CT imaging of the head and cervical spine was performed following the standard protocol without intravenous contrast. Multiplanar CT image reconstructions of the cervical spine were also generated. COMPARISON:  Head CT dated 03/11/2016 an MRI dated 03/11/2016 and bone scan dated 10/06/2016 FINDINGS: CT HEAD FINDINGS Brain: Lobulated high attenuating masses along the left frontal dural surface most consistent with metastatic disease. Subdural hemorrhage is less likely but not entirely excluded. Overall there has been interval increase in the size  of the dural based masses  compared to the prior CT and MRI. These measure up to 11 mm in thickness. There is associated mass effect and compression of the adjacent brain parenchyma with effacement of the sulci and associated mass effect on the left lateral ventricle. There is approximately 8 mm left-to-right midline shift which is increased since the prior CT. Moderate periventricular and deep white matter hypodensities consistent with chronic microvascular ischemic changes. An area of white matter hypodensity in the left frontal lobe likely combination of chronic microvascular ischemic changes and vasogenic edema. Vascular: No hyperdense vessel or unexpected calcification. Skull: Partially calcified or expansile skullbase masses involving the left frontal bone as well as left temporal bone have increased in size compared to the prior CT. A lobulated component of the mass in the left frontal cortex measures 3.2 x 2.1 cm. Sinuses/Orbits: There is opacification of the left frontal sinus as well as opacification of multiple ethmoid air cells. The remainder of the visualized paranasal sinuses and mastoid air cells are clear. Other: Left frontal and temporal extra calvarial soft tissue thickening may represent hematoma. Extension of metastatic tumor is not excluded. CT CERVICAL SPINE FINDINGS Alignment: No acute subluxation. There is reversal of normal cervical lordosis which may be positional or due to muscle spasm or secondary to degenerative changes. Skull base and vertebrae: There is no acute fracture. There is expansile osseous metastatic disease involving the lower cervical and visualized upper thoracic spine and ribs with sclerotic changes. Soft tissues and spinal canal: No prevertebral fluid or swelling. No visible canal hematoma. Disc levels: Multilevel degenerative changes with disc space narrowing and endplate irregularity most prominent at C7-T1 Upper chest: A partially visualized 2.2 x 3.2 cm soft tissue nodular density in the right  supraclavicular region likely represents an enlarged lymph node. Chest CT may provide better evaluation. A right sided Port-A-Cath is partially visualized. Other: None IMPRESSION: 1. Lobulated high attenuating dural-based masses along the left frontal convexity with interval increase in size since the prior studies most consistent with progression of metastatic disease. Superimposed subdural hemorrhage is not entirely excluded. There is associated mass effect with approximately 8 mm left-to-right midline shift, increased since the prior CTs. Close follow-up with CT or further evaluation with MRI without and with contrast is recommended. 2. Calvarial expansile metastatic disease involving the left frontal and temporal lobes with increase in the size of the osseous metastatic disease since the prior CT. 3. No acute/traumatic cervical spine pathology. 4. Osseous sclerotic and expansile metastatic disease involving the lower cervical and visualized upper thoracic spine and ribs. 5. Partially visualized ovoid soft tissue lesion in the right supraclavicular region is concerning for metastatic disease. CT of the chest may provide better characterisation if clinically indicated. These results were called by telephone at the time of interpretation on 02/06/2017 at 9:52 pm to Dr. Julianne Rice , who verbally acknowledged these results. Electronically Signed   By: Anner Crete M.D.   On: 02/06/2017 22:03   Mr Steven Church And Wo Contrast  Result Date: 02/07/2017 CLINICAL DATA:  Intracranial metastatic disease and possible intracranial hemorrhage. EXAM: MRI HEAD WITHOUT AND WITH CONTRAST TECHNIQUE: Multiplanar, multiecho pulse sequences of the brain and surrounding structures were obtained without and with intravenous contrast. CONTRAST:  57mL MULTIHANCE GADOBENATE DIMEGLUMINE 529 MG/ML IV SOLN COMPARISON:  Head CT 02/06/2017 Brain MRI 03/11/2016 FINDINGS: Brain: There is a large, predominantly calcified extra-axial mass  the left frontal convexity that exerts mass effect on the underlying brain and causes  vasogenic edema pain and rightward midline shift of 7 mm. The mass expands part of the left frontoparietal calvarium and extends through the skull to include a large extracranial component. Altogether, the mass measures approximately 7.4 x 2.7 cm cyst, though its irregular shape limits the accuracy of the measurement. There is dural thickening and contrast enhancement throughout the left hemisphere. A small focus of high DWI signal underlies the mass without diffusion restriction on the ADC map. There is no discrete subdural hematoma. The hyperdense material on the CT corresponds to a portion of the solid tumor. There is bilateral periventricular white matter hyperintensity, consistent with chronic microvascular ischemia. Vascular: Major intracranial arterial and venous sinus flow voids are preserved. No evidence of chronic microhemorrhage or amyloid angiopathy. Skull and upper cervical spine: Left frontoparietal calvarial centered lesion, as above. No other discrete osseous lesions are identified. Sinuses/Orbits: Left frontal mucosal thickening. Normal orbits. IMPRESSION: 1. Large metastatic lesion centered in the left frontoparietal calvarium with large intracranial dural and extracranial soft tissue components with reactive thickening of much of the left hemispheric dura. 2. Vasogenic edema within the brain adjacent to the above-described lesion with 7 mm of rightward midline shift and early subfalcine herniation. 3. No subdural hematoma or acute ischemia. Electronically Signed   By: Ulyses Jarred M.D.   On: 02/07/2017 03:35     Discharge Exam: Vitals:   02/08/17 2139 02/09/17 0615  BP: 121/78 127/77  Pulse: (!) 110 97  Resp: 20 20  Temp: 97.8 F (36.6 C) 97.4 F (36.3 C)   Vitals:   02/08/17 0517 02/08/17 1348 02/08/17 2139 02/09/17 0615  BP: 128/88 124/77 121/78 127/77  Pulse: (!) 105 (!) 115 (!) 110 97   Resp: 18 18 20 20   Temp: 99 F (37.2 C) 98.9 F (37.2 C) 97.8 F (36.6 C) 97.4 F (36.3 C)  TempSrc: Oral Oral Axillary Axillary  SpO2: 99% 100% 99% 99%  Weight: 63.3 kg (139 lb 9.6 oz)     Height:        General: Pt is alert, confused Cardiovascular: Regular rate and rhythm, S1/S2 +, no murmurs, no rubs, no gallops Respiratory: Clear to auscultation bilaterally, no wheezing, no crackles, no rhonchi   Discharge Instructions  Discharge Instructions    Diet - low sodium heart healthy    Complete by:  As directed    Increase activity slowly    Complete by:  As directed      Allergies as of 02/09/2017      Reactions   Losartan Potassium-hctz Other (See Comments)   dizziness      Medication List    STOP taking these medications   [facility] CALCIUM+VIT D 500-125 MG-UNIT Tabs Generic drug:  Calcium Carb-Cholecalciferol     TAKE these medications   amLODipine 10 MG tablet Commonly known as:  NORVASC TAKE 1 TABLET BY MOUTH EVERY DAY   calcitRIOL 0.5 MCG capsule Commonly known as:  ROCALTROL TAKE 1 CAPSULE TWICE A DAY What changed:  See the new instructions.   finasteride 5 MG tablet Commonly known as:  PROSCAR TAKE 1 TABLET BY MOUTH EVERY DAY   KLOR-CON M20 20 MEQ tablet Generic drug:  potassium chloride SA Take 1 tablet (20 mEq total) by mouth daily. What changed:  when to take this   omeprazole 40 MG capsule Commonly known as:  PRILOSEC TAKE 1 CAPSULE (40 MG TOTAL) BY MOUTH DAILY.   tamsulosin 0.4 MG Caps capsule Commonly known as:  FLOMAX TAKE ONE  CAPSULE BY MOUTH EVERY DAY      Follow-up Information    Chana Bode [facility], PA-C Follow up.   Specialty:  Family Medicine Contact information: [address] [place] [address] [phone]            The results of significant diagnostics from this hospitalization (including imaging, microbiology, ancillary and laboratory) are listed below for reference.     Microbiology: No  results found for this or any previous visit (from the past 240 hour(s)).   Labs: Basic Metabolic Panel:  Recent Labs Lab 02/06/17 2130 02/08/17 0422  NA 139 136  K 3.3* 4.0  CL 109 106  CO2 19* 20*  GLUCOSE 90 167*  BUN 9 11  CREATININE 0.74 0.72  CALCIUM 9.0 9.8   Liver Function Tests:  Recent Labs Lab 02/06/17 2130  AST 30  ALT 9*  ALKPHOS 172*  BILITOT 1.2  PROT 7.0  ALBUMIN 3.1*   CBC:  Recent Labs Lab 02/06/17 2130 02/08/17 0422  WBC 13.8* 13.8*  NEUTROABS 11.5*  --   HGB 8.3* 8.9*  HCT 24.1* 26.0*  MCV 86.7 84.7  PLT 206 249   Cardiac Enzymes:  Recent Labs Lab 02/06/17 2130 02/08/17 0422  CKTOTAL 466* 751*    SIGNED: Time coordinating discharge: 30 minutes  Faye Ramsay, MD  [facility] 02/09/2017, 11:31 AM Pager [phone]  If 7PM-7AM, please contact night-coverage [email or web address] Password [email or web address]

## 2017-02-09 NOTE — Progress Notes (Addendum)
PROGRESS NOTE  Steven Church Sr.  UXN:[id number] DOB: 1951/06/22 DOA: 02/06/2017   PCP: [name], PA-C   Brief Narrative: 66 y.o. male with a past medical history significant for HTN and metastatic prostate cancer who presented with increased confusion from home after being found between the bed and the wall. On arrival he was afebrile in no distress. CK was elevated at 466, UDS +benzodiazepines. CT of the head showed interval progression of dural metastases and calavarial mets and worsening midline shift, possible SDH. Urgent MRI was recommended, for which the patient was transferred to [facility]. This demonstrated no SDH but vasogenic edema around worsening metastasis. He was transferred back to [facility] for admission. Oncology and palliative care were consulted. Dr. Burr Medico is to discuss with radiation oncology.   Patient was initially diagnosed in 2016 with prostate cancer, metastatic at the time of diagnosis.  He had brain mets noted early 2017 and received whole brain radiation therapy in Apr 2017.  Had treatment failure with androgen deprivation and so since January 2018 has been on Taxotere, which is currently being continued because the patient seemed to be having improved functional status on therapy.  Assessment & Plan:  Fall, acute encephalopathy due to cerebral edema related to brain/skull metastasis - H/o irradiation. Up to 81mm midline shift was noted as long ago as last June CT head, and so presumably his current mental status change is more from edema.  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  - Continue norvasc   Anemia of chronic disease, prostate cancer - no  signs of active bleeding  - no further blood work in an effort to ensure comfort   Leukocytosis - with T 75F, WBC 13.8, HR up to 105 - suspect reactive process from cerebral edema in the setting of brain and skull metastasis - pt also on decadron so likely contributing to leukocytosis - focus on comfort per family request   Hypokalemia - Mild, repleted at admission.   Subjective: Pt without pain, currently alert, awake, confused.   Objective: BP 127/77 (BP Location: Left Arm)   Pulse 97   Temp 97.4 F (36.3 C) (Axillary)   Resp 20   Ht 5\' 10"  (1.778 m)   Wt 63.3 kg (139 lb 9.6 oz)   SpO2 99%   BMI 20.03 kg/m   Gen: 66yo M alert, confused, pulling lines  CV: RRR, no murmur, JVD or edema Pulm: Nonlabored, clear  CBC: CBC Latest Ref Rng & Units 02/08/2017 02/06/2017 01/13/2017  WBC 4.0 - 10.5 K/uL 13.8(H) 13.8(H) 10.7(H)  Hemoglobin 13.0 - 17.0 g/dL 8.9(L) 8.3(L) 9.2(L)  Hematocrit 39.0 - 52.0 % 26.0(L) 24.1(L) 26.9(L)  Platelets 150 - 400 K/uL 249 206 237   Scheduled medications: . amLODipine  10 mg Oral Daily  . calcitRIOL  0.5 mcg Oral BID  . calcium-vitamin D  1 tablet Oral BID  . chlorhexidine  15 mL Mouth Rinse BID  . dexamethasone  4 mg Intravenous Q4H  . enoxaparin (LOVENOX) injection  40 mg Subcutaneous Q24H  . feeding supplement (ENSURE ENLIVE)  237 mL Oral BID BM  . finasteride  5 mg Oral Daily  . mouth  rinse  15 mL Mouth Rinse q12n4p  . pantoprazole  80 mg Oral Daily  . polyvinyl alcohol  1 drop Left Eye QID  . potassium chloride SA  20 mEq Oral BID  . tamsulosin  0.4 mg Oral Daily    Faye Ramsay, MD [facility] Pager [phone]  If 7PM-7AM, please contact night-coverage [email or web address] Password [email or web address] 02/09/2017, 11:22 AM

## 2017-02-09 NOTE — Progress Notes (Signed)
LCSW following for discharge planning/disposition:  Pt has bed at [facility] today per [place], hospice.  Patient will transport by [facility]. CSW completed medical necessity form and called for transportation.  Family notified regarding discharge - pt's son in room, CSW spoke with pt and he is agreeable.  All information sent to facility by [place] Report number for RN:  [phone]  No other needs at this time   Plan: DC to [facility].  Sharren Bridge, MSW, LCSW Clinical Social Work 02/09/2017 [phone]

## 2017-02-09 NOTE — Discharge Instructions (Signed)
Neuropathic Pain Neuropathic pain is pain caused by damage to the nerves that are responsible for certain sensations in your body (sensory nerves). The pain can be caused by damage to:  The sensory nerves that send signals to your spinal cord and brain (peripheral nervous system).  The sensory nerves in your brain or spinal cord (central nervous system). Neuropathic pain can make you more sensitive to pain. What would be a minor sensation for most people may feel very painful if you have neuropathic pain. This is usually a long-term condition that can be difficult to treat. The type of pain can differ from person to person. It may start suddenly (acute), or it may develop slowly and last for a long time (chronic). Neuropathic pain may come and go as damaged nerves heal or may stay at the same level for years. It often causes emotional distress, loss of sleep, and a lower quality of life. What are the causes? The most common cause of damage to a sensory nerve is diabetes. Many other diseases and conditions can also cause neuropathic pain. Causes of neuropathic pain can be classified as:  Toxic. Many drugs and chemicals can cause toxic damage. The most common cause of toxic neuropathic pain is damage from drug treatment for cancer (chemotherapy).  Metabolic. This type of pain can happen when a disease causes imbalances that damage nerves. Diabetes is the most common of these diseases. Vitamin B deficiency caused by long-term alcohol abuse is another common cause.  Traumatic. Any injury that cuts, crushes, or stretches a nerve can cause damage and pain. A common example is feeling pain after losing an arm or leg (phantom limb pain).  Compression-related. If a sensory nerve gets trapped or compressed for a long period of time, the blood supply to the nerve can be cut off.  Vascular. Many blood vessel diseases can cause neuropathic pain by decreasing blood supply and oxygen to nerves.  Autoimmune.  This type of pain results from diseases in which the bodys defense system mistakenly attacks sensory nerves. Examples of autoimmune diseases that can cause neuropathic pain include lupus and multiple sclerosis.  Infectious. Many types of viral infections can damage sensory nerves and cause pain. Shingles infection is a common cause of this type of pain.  Inherited. Neuropathic pain can be a symptom of many diseases that are passed down through families (genetic). What are the signs or symptoms? The main symptom is pain. Neuropathic pain is often described as:  Burning.  Shock-like.  Stinging.  Hot or cold.  Itching. How is this diagnosed? No single test can diagnose neuropathic pain. Your health care provider will do a physical exam and ask you about your pain. You may use a pain scale to describe how bad your pain is. You may also have tests to see if you have a high sensitivity to pain and to help find the cause and location of any sensory nerve damage. These tests may include:  Imaging studies, such as:  X-rays.  CT scan.  MRI.  Nerve conduction studies to test how well nerve signals travel through your sensory nerves (electrodiagnostic testing).  Stimulating your sensory nerves through electrodes on your skin and measuring the response in your spinal cord and brain (somatosensory evoked potentials). How is this treated? Treatment for neuropathic pain may change over time. You may need to try different treatment options or a combination of treatments. Some options include:  Over-the-counter pain relievers.  Prescription medicines. Some medicines used to treat other  conditions may also help neuropathic pain. These include medicines to:  Control seizures (anticonvulsants).  Relieve depression (antidepressants).  Prescription-strength pain relievers (narcotics). These are usually used when other pain relievers do not help.  Transcutaneous nerve stimulation (TENS). This  uses electrical currents to block painful nerve signals. The treatment is painless.  Topical and local anesthetics. These are medicines that numb the nerves. They can be injected as a nerve block or applied to the skin.  Alternative treatments, such as:  Acupuncture.  Meditation.  Massage.  Physical therapy.  Pain management programs.  Counseling. Follow these instructions at home:  Learn as much as you can about your condition.  Take medicines only as directed by your health care provider.  Work closely with all your health care providers to find what works best for you.  Have a good support system at home.  Consider joining a chronic pain support group. Contact a health care provider if:  Your pain treatments are not helping.  You are having side effects from your medicines.  You are struggling with fatigue, mood changes, depression, or anxiety. This information is not intended to replace advice given to you by your health care provider. Make sure you discuss any questions you have with your health care provider. Document Released: 06/30/2004 Document Revised: 04/22/2016 Document Reviewed: 03/13/2014 Elsevier Interactive Patient Education  2017 [employer].

## 2017-02-10 ENCOUNTER — Ambulatory Visit: Payer: Medicare Other | Admitting: Oncology

## 2017-02-10 ENCOUNTER — Encounter: Payer: Medicare Other | Admitting: Nutrition

## 2017-02-10 ENCOUNTER — Ambulatory Visit: Payer: Medicare Other

## 2017-02-10 ENCOUNTER — Other Ambulatory Visit: Payer: Medicare Other

## 2017-02-11 ENCOUNTER — Ambulatory Visit: Payer: Medicare Other

## 2017-02-13 ENCOUNTER — Telehealth: Payer: Self-pay | Admitting: Medical

## 2017-02-13 NOTE — Telephone Encounter (Signed)
Can you call his sister.  I see from notes that he was discharged to Hospice facility.  How is his spirits?  Does she feel they addressed all their concerns?  What hospice facility was he transferred to?   He is in my thoughts and prayers, and I thought it was time for hospice as well.  Let me speak to sister if possible after you talk to her

## 2017-02-14 NOTE — Telephone Encounter (Signed)
His is his sister # [phone]  [name]

## 2017-03-03 ENCOUNTER — Ambulatory Visit: Payer: Medicare Other

## 2017-03-03 ENCOUNTER — Ambulatory Visit: Payer: Medicare Other | Admitting: Oncology

## 2017-03-03 ENCOUNTER — Other Ambulatory Visit: Payer: Medicare Other

## 2017-03-04 ENCOUNTER — Ambulatory Visit: Payer: Medicare Other

## 2017-04-25 IMAGING — NM NM BONE WHOLE BODY
2 series · 2 of 2 positions shown · non-contrast
Comparison: None

Correlation: CT chest abdomen pelvis 10/06/2016

CLINICAL DATA: Prostate cancer, PSA = 114.8, LEFT hip pain

EXAM:
NUCLEAR MEDICINE WHOLE BODY BONE SCAN
TECHNIQUE: Whole body anterior and posterior images were obtained approximately
3 hours after intravenous injection of radiopharmaceutical.
RADIOPHARMACEUTICALS:  20 mCi Wechnetium-JJm MDP IV

[Series 1: wbr_bone_40 whole body · 2.66mm/px · 1 of 1 slices shown (1 of 2)]
[im 1/1]
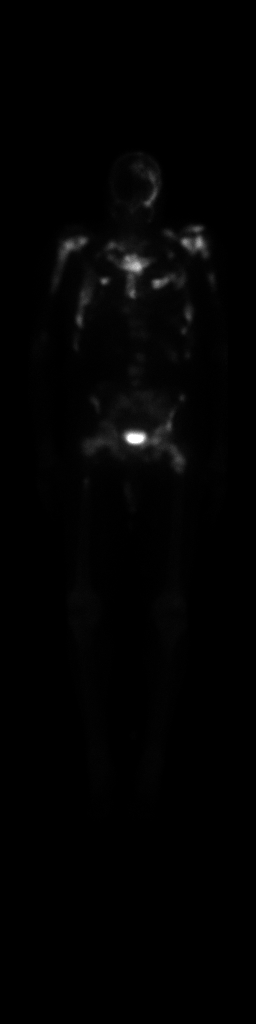

[Series 1: wbr_bone_40 whole body · 2.66mm/px · 1 of 1 slices shown (2 of 2)]
[im 1/1]
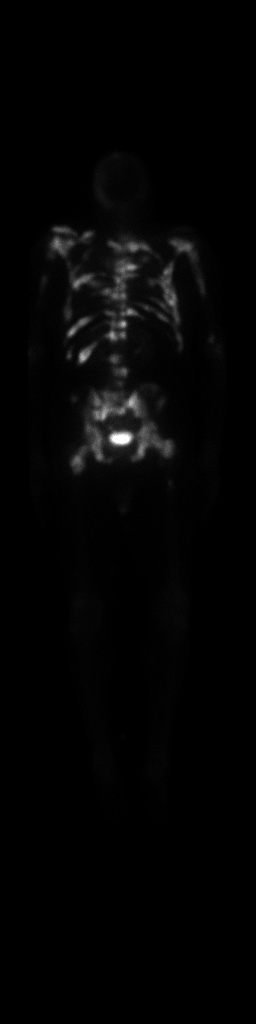

[2 of 2 positions shown; findings below may reference images not displayed]

FINDINGS: Numerous abnormal foci of increased osseous tracer accumulation
identified throughout the appendicular and axial skeleton consistent
with widespread osseous metastatic disease.

The sites include BILATERAL anterior and posterior ribs, sternum,
scapulae, BILATERAL proximal and distal RIGHT humeri, calvarium,
thoracic and lumbar spine, sacrum and pelvis, and BILATERAL proximal
femora.

Significant increased uptake at the LEFT femoral neck and
intertrochanteric region of the LEFT femur which may account for
patient's hip pain, though additional sites of abnormal tracer are
seen throughout the LEFT pelvis as well.

Single focus of uptake at the proximal RIGHT femoral diaphysis.
IMPRESSION: Numerous sites of abnormal increased osseous tracer accumulation
throughout the axial and appendicular skeleton as above consistent
with widespread osseous metastatic disease.

These correspond to multiple sites of sclerosis as identified on CT
exam of same date.

## 2017-05-03 ENCOUNTER — Other Ambulatory Visit: Payer: Self-pay | Admitting: Medical

## 2017-07-21 NOTE — Therapy (Signed)
[facility] [address] [place], [place], [address] Phone: [phone]   Fax:  [phone]  Patient Details  Name: Steven Church Sr. MRN: [id number] Date of Birth: 01/31/1951 Referring Provider:  No ref. provider found  Encounter Date: 07/21/2017 SPEECH THERAPY DISCHARGE SUMMARY  Visits from Start of Care: 4  Current functional level related to goals / functional outcomes: Pt's last goals in force were as follows:   SLP Short Term Goals - 03/07/16 1102     SLP SHORT TERM GOAL #1    Title pt will demo HEP for verbal expression with rare min A    Time 3    Period Weeks    Status On-going    SLP SHORT TERM GOAL #2    Title  pt will generate WNL speech in 8 minute simple conversation over two sessions    Time 3    Period Weeks    Status On-going    SLP SHORT TERM GOAL #3    Title pt will complete mod complex naming tasks with 90% success and rare min A    Time 3    Period Weeks    Status On-going                   SLP Long Term Goals - 03/07/16 1102     SLP LONG TERM GOAL #1    Title pt will complete HEP independently    Time 7    Period Weeks    Status On-going    SLP LONG TERM GOAL #2    Title pt will generate 8 minutes mod complex conversation with modified independence (slower rate, etc)    Time 7    Period Weeks    Status On-going    Pt did not return after IP admission, despite being recommended by treating SLPs in-house.    Remaining deficits: Assumed some degree of deficits remain.     Education / Equipment: PRN  Plan: Patient agrees to discharge.  Patient goals were not met. Patient is being discharged due to not returning since the last visit.  ?????       [facility] ,MS, CCC-SLP  07/21/2017, 4:57 PM  [facility] [address] [place], [place], [address] Phone: [phone]   Fax:  [phone]

## 2018-12-01 ENCOUNTER — Other Ambulatory Visit: Payer: Self-pay | Admitting: Nurse Practitioner

## 2018-12-01 ENCOUNTER — Encounter: Payer: Self-pay | Admitting: Oncology

## 2019-01-22 ENCOUNTER — Telehealth: Payer: Self-pay | Admitting: Medical

## 2019-01-22 NOTE — Telephone Encounter (Signed)
Call and check in

## 2019-01-23 ENCOUNTER — Encounter: Payer: Self-pay | Admitting: Medical
